# Patient Record
Sex: Female | Born: 1935 | Race: White | Hispanic: No | Smoking: Never smoker
Health system: Southern US, Community
[De-identification: ages and names within clinical notes are randomized; demographics above are authoritative.]

## PROBLEM LIST (undated history)

## (undated) DIAGNOSIS — I10 Essential (primary) hypertension: Secondary | ICD-10-CM

## (undated) DIAGNOSIS — G039 Meningitis, unspecified: Secondary | ICD-10-CM

## (undated) DIAGNOSIS — K219 Gastro-esophageal reflux disease without esophagitis: Secondary | ICD-10-CM

## (undated) DIAGNOSIS — H409 Unspecified glaucoma: Secondary | ICD-10-CM

## (undated) DIAGNOSIS — H3530 Unspecified macular degeneration: Secondary | ICD-10-CM

## (undated) DIAGNOSIS — R7309 Other abnormal glucose: Secondary | ICD-10-CM

## (undated) DIAGNOSIS — E785 Hyperlipidemia, unspecified: Secondary | ICD-10-CM

## (undated) DIAGNOSIS — C801 Malignant (primary) neoplasm, unspecified: Secondary | ICD-10-CM

## (undated) HISTORY — DX: Malignant (primary) neoplasm, unspecified: C80.1

## (undated) HISTORY — DX: Gastro-esophageal reflux disease without esophagitis: K21.9

## (undated) HISTORY — PX: MASTECTOMY: SHX3

## (undated) HISTORY — DX: Other abnormal glucose: R73.09

## (undated) HISTORY — PX: ABDOMINAL HYSTERECTOMY: SHX81

## (undated) HISTORY — DX: Hyperlipidemia, unspecified: E78.5

## (undated) HISTORY — PX: CORONARY STENT PLACEMENT: SHX1402

## (undated) HISTORY — PX: COLON RESECTION: SHX5231

## (undated) HISTORY — DX: Unspecified glaucoma: H40.9

## (undated) HISTORY — DX: Essential (primary) hypertension: I10

---

## 1971-11-28 HISTORY — PX: EYE SURGERY: SHX253

## 1977-11-27 HISTORY — PX: BREAST SURGERY: SHX581

## 1998-04-20 ENCOUNTER — Other Ambulatory Visit: Admission: RE | Admit: 1998-04-20 | Discharge: 1998-04-20 | Payer: Self-pay | Admitting: Internal Medicine

## 1998-09-24 ENCOUNTER — Encounter: Payer: Self-pay | Admitting: Emergency Medicine

## 1998-09-24 ENCOUNTER — Emergency Department (HOSPITAL_COMMUNITY): Admission: EM | Admit: 1998-09-24 | Discharge: 1998-09-24 | Payer: Self-pay | Admitting: Emergency Medicine

## 1998-10-22 ENCOUNTER — Encounter: Payer: Self-pay | Admitting: Internal Medicine

## 1998-10-22 ENCOUNTER — Ambulatory Visit (HOSPITAL_COMMUNITY): Admission: RE | Admit: 1998-10-22 | Discharge: 1998-10-22 | Payer: Self-pay | Admitting: Internal Medicine

## 1999-10-25 ENCOUNTER — Ambulatory Visit (HOSPITAL_COMMUNITY): Admission: RE | Admit: 1999-10-25 | Discharge: 1999-10-25 | Payer: Self-pay | Admitting: Internal Medicine

## 1999-10-25 ENCOUNTER — Encounter: Payer: Self-pay | Admitting: Internal Medicine

## 2000-04-19 ENCOUNTER — Ambulatory Visit (HOSPITAL_COMMUNITY): Admission: RE | Admit: 2000-04-19 | Discharge: 2000-04-19 | Payer: Self-pay | Admitting: Ophthalmology

## 2000-10-25 ENCOUNTER — Ambulatory Visit (HOSPITAL_COMMUNITY): Admission: RE | Admit: 2000-10-25 | Discharge: 2000-10-25 | Payer: Self-pay | Admitting: Internal Medicine

## 2000-10-25 ENCOUNTER — Encounter: Payer: Self-pay | Admitting: Internal Medicine

## 2000-11-07 ENCOUNTER — Encounter: Admission: RE | Admit: 2000-11-07 | Discharge: 2000-11-07 | Payer: Self-pay | Admitting: Internal Medicine

## 2000-11-07 ENCOUNTER — Encounter: Payer: Self-pay | Admitting: Internal Medicine

## 2001-10-29 ENCOUNTER — Encounter: Payer: Self-pay | Admitting: Internal Medicine

## 2001-10-29 ENCOUNTER — Ambulatory Visit (HOSPITAL_COMMUNITY): Admission: RE | Admit: 2001-10-29 | Discharge: 2001-10-29 | Payer: Self-pay | Admitting: Internal Medicine

## 2002-10-30 ENCOUNTER — Encounter: Payer: Self-pay | Admitting: Internal Medicine

## 2002-10-30 ENCOUNTER — Ambulatory Visit (HOSPITAL_COMMUNITY): Admission: RE | Admit: 2002-10-30 | Discharge: 2002-10-30 | Payer: Self-pay | Admitting: Internal Medicine

## 2004-04-22 ENCOUNTER — Ambulatory Visit (HOSPITAL_COMMUNITY): Admission: RE | Admit: 2004-04-22 | Discharge: 2004-04-22 | Payer: Self-pay | Admitting: Internal Medicine

## 2005-04-28 ENCOUNTER — Ambulatory Visit (HOSPITAL_COMMUNITY): Admission: RE | Admit: 2005-04-28 | Discharge: 2005-04-28 | Payer: Self-pay | Admitting: Internal Medicine

## 2006-04-30 ENCOUNTER — Ambulatory Visit (HOSPITAL_COMMUNITY): Admission: RE | Admit: 2006-04-30 | Discharge: 2006-04-30 | Payer: Self-pay | Admitting: Internal Medicine

## 2006-11-27 HISTORY — PX: SKIN CANCER EXCISION: SHX779

## 2007-06-13 ENCOUNTER — Ambulatory Visit (HOSPITAL_COMMUNITY): Admission: RE | Admit: 2007-06-13 | Discharge: 2007-06-13 | Payer: Self-pay | Admitting: Internal Medicine

## 2008-07-29 ENCOUNTER — Ambulatory Visit (HOSPITAL_COMMUNITY): Admission: RE | Admit: 2008-07-29 | Discharge: 2008-07-29 | Payer: Self-pay | Admitting: Internal Medicine

## 2008-09-04 ENCOUNTER — Ambulatory Visit: Payer: Self-pay | Admitting: Gastroenterology

## 2008-09-08 ENCOUNTER — Ambulatory Visit: Payer: Self-pay | Admitting: Gastroenterology

## 2008-09-09 ENCOUNTER — Ambulatory Visit (HOSPITAL_COMMUNITY): Admission: RE | Admit: 2008-09-09 | Discharge: 2008-09-09 | Payer: Self-pay | Admitting: Gastroenterology

## 2008-09-10 ENCOUNTER — Telehealth: Payer: Self-pay | Admitting: Gastroenterology

## 2008-09-28 ENCOUNTER — Ambulatory Visit: Payer: Self-pay | Admitting: Gastroenterology

## 2008-09-28 LAB — CONVERTED CEMR LAB
BUN: 17 mg/dL (ref 6–23)
Basophils Absolute: 0 10*3/uL (ref 0.0–0.1)
Basophils Relative: 0.4 % (ref 0.0–3.0)
CO2: 30 meq/L (ref 19–32)
Chloride: 106 meq/L (ref 96–112)
GFR calc Af Amer: 103 mL/min
Glucose, Bld: 107 mg/dL — ABNORMAL HIGH (ref 70–99)
Hemoglobin: 13 g/dL (ref 12.0–15.0)
MCHC: 33.8 g/dL (ref 30.0–36.0)
Monocytes Relative: 8.9 % (ref 3.0–12.0)
Neutrophils Relative %: 72.2 % (ref 43.0–77.0)
Platelets: 251 10*3/uL (ref 150–400)
Potassium: 3.9 meq/L (ref 3.5–5.1)
WBC: 8 10*3/uL (ref 4.5–10.5)

## 2008-09-29 LAB — CONVERTED CEMR LAB: CEA: 1 ng/mL (ref 0.0–5.0)

## 2008-09-30 ENCOUNTER — Ambulatory Visit: Payer: Self-pay | Admitting: Cardiology

## 2008-10-12 ENCOUNTER — Encounter: Payer: Self-pay | Admitting: Gastroenterology

## 2008-10-14 ENCOUNTER — Ambulatory Visit: Payer: Self-pay | Admitting: Cardiology

## 2008-11-02 ENCOUNTER — Encounter: Payer: Self-pay | Admitting: Cardiology

## 2008-11-02 ENCOUNTER — Ambulatory Visit: Payer: Self-pay

## 2008-12-17 ENCOUNTER — Encounter: Payer: Self-pay | Admitting: Gastroenterology

## 2008-12-18 ENCOUNTER — Encounter: Admission: RE | Admit: 2008-12-18 | Discharge: 2008-12-18 | Payer: Self-pay | Admitting: Surgery

## 2009-01-08 ENCOUNTER — Encounter: Admission: RE | Admit: 2009-01-08 | Discharge: 2009-01-08 | Payer: Self-pay | Admitting: Surgery

## 2009-01-25 ENCOUNTER — Inpatient Hospital Stay (HOSPITAL_COMMUNITY): Admission: RE | Admit: 2009-01-25 | Discharge: 2009-01-31 | Payer: Self-pay | Admitting: Surgery

## 2009-01-25 ENCOUNTER — Ambulatory Visit: Payer: Self-pay | Admitting: Cardiology

## 2009-02-19 ENCOUNTER — Encounter: Payer: Self-pay | Admitting: Gastroenterology

## 2009-09-03 ENCOUNTER — Ambulatory Visit (HOSPITAL_COMMUNITY): Admission: RE | Admit: 2009-09-03 | Discharge: 2009-09-03 | Payer: Self-pay | Admitting: Internal Medicine

## 2010-08-05 ENCOUNTER — Observation Stay (HOSPITAL_COMMUNITY): Admission: EM | Admit: 2010-08-05 | Discharge: 2010-08-07 | Payer: Self-pay | Admitting: Emergency Medicine

## 2010-09-05 ENCOUNTER — Ambulatory Visit (HOSPITAL_COMMUNITY): Admission: RE | Admit: 2010-09-05 | Discharge: 2010-09-05 | Payer: Self-pay | Admitting: Internal Medicine

## 2010-12-18 ENCOUNTER — Encounter: Payer: Self-pay | Admitting: Internal Medicine

## 2011-01-25 ENCOUNTER — Other Ambulatory Visit: Payer: Self-pay | Admitting: Internal Medicine

## 2011-01-25 DIAGNOSIS — R140 Abdominal distension (gaseous): Secondary | ICD-10-CM

## 2011-01-25 DIAGNOSIS — R197 Diarrhea, unspecified: Secondary | ICD-10-CM

## 2011-01-30 ENCOUNTER — Ambulatory Visit
Admission: RE | Admit: 2011-01-30 | Discharge: 2011-01-30 | Disposition: A | Discharge status: Home / Self Care | Payer: Medicare Other | Source: Ambulatory Visit | Attending: Internal Medicine | Admitting: Internal Medicine

## 2011-01-30 ENCOUNTER — Encounter: Payer: Self-pay | Admitting: Gastroenterology

## 2011-01-30 DIAGNOSIS — R197 Diarrhea, unspecified: Secondary | ICD-10-CM

## 2011-01-30 DIAGNOSIS — R140 Abdominal distension (gaseous): Secondary | ICD-10-CM

## 2011-01-30 MED ORDER — IOHEXOL 300 MG/ML  SOLN
100.0000 mL | Freq: Once | INTRAMUSCULAR | Status: AC | PRN
  Administered 2011-01-30: 100 mL via INTRAVENOUS

## 2011-02-09 LAB — POCT I-STAT, CHEM 8
BUN: 20 mg/dL (ref 6–23)
Calcium, Ion: 1.12 mmol/L (ref 1.12–1.32)
Chloride: 107 meq/L (ref 96–112)
Creatinine, Ser: 0.6 mg/dL (ref 0.4–1.2)
Glucose, Bld: 139 mg/dL — ABNORMAL HIGH (ref 70–99)
HCT: 44 % (ref 36.0–46.0)
Hemoglobin: 15 g/dL (ref 12.0–15.0)
Potassium: 3.9 mEq/L (ref 3.5–5.1)
Sodium: 142 meq/L (ref 135–145)
TCO2: 26 mmol/L (ref 0–100)

## 2011-02-09 LAB — CBC
MCHC: 33.8 g/dL (ref 30.0–36.0)
Platelets: 166 10*3/uL (ref 150–400)
RDW: 13.9 % (ref 11.5–15.5)
WBC: 4.9 10*3/uL (ref 4.0–10.5)

## 2011-02-09 LAB — PHOSPHORUS: Phosphorus: 4.4 mg/dL (ref 2.3–4.6)

## 2011-02-09 LAB — URINALYSIS, ROUTINE W REFLEX MICROSCOPIC
Glucose, UA: NEGATIVE mg/dL
Nitrite: NEGATIVE
pH: 7 (ref 5.0–8.0)

## 2011-02-09 LAB — POCT CARDIAC MARKERS
CKMB, poc: <1 ng/mL — ABNORMAL LOW (ref 1.0–8.0)
Myoglobin, poc: 54.1 ng/mL (ref 12–200)
Troponin i, poc: <0.05 ng/mL (ref 0.00–0.09)

## 2011-02-09 LAB — HEMOGLOBIN A1C
Hgb A1c MFr Bld: 6.4 % — ABNORMAL HIGH (ref <5.7)
Mean Plasma Glucose: 137 mg/dL — ABNORMAL HIGH (ref <117)

## 2011-02-09 LAB — COMPREHENSIVE METABOLIC PANEL
AST: 21 U/L (ref 0–37)
Albumin: 3 g/dL — ABNORMAL LOW (ref 3.5–5.2)
BUN: 16 mg/dL (ref 6–23)
Calcium: 8.7 mg/dL (ref 8.4–10.5)
Chloride: 110 mEq/L (ref 96–112)
Creatinine, Ser: 0.68 mg/dL (ref 0.4–1.2)
GFR calc Af Amer: >60 mL/min (ref >60)
Total Bilirubin: 0.6 mg/dL (ref 0.3–1.2)

## 2011-02-09 LAB — DIFFERENTIAL
Basophils Absolute: 0 10*3/uL (ref 0.0–0.1)
Eosinophils Absolute: 0.2 10*3/uL (ref 0.0–0.7)
Lymphocytes Relative: 19 % (ref 12–46)
Monocytes Relative: 7 % (ref 3–12)

## 2011-02-09 LAB — VITAMIN B12: Vitamin B-12: 488 pg/mL (ref 211–911)

## 2011-03-09 LAB — BASIC METABOLIC PANEL
CO2: 27 mEq/L (ref 19–32)
CO2: 27 mEq/L (ref 19–32)
Calcium: 7.7 mg/dL — ABNORMAL LOW (ref 8.4–10.5)
Calcium: 7.9 mg/dL — ABNORMAL LOW (ref 8.4–10.5)
Chloride: 105 mEq/L (ref 96–112)
Chloride: 106 mEq/L (ref 96–112)
Chloride: 110 mEq/L (ref 96–112)
Creatinine, Ser: 0.64 mg/dL (ref 0.4–1.2)
GFR calc Af Amer: >60 mL/min (ref >60)
GFR calc Af Amer: >60 mL/min (ref >60)
GFR calc Af Amer: >60 mL/min (ref >60)
Glucose, Bld: 146 mg/dL — ABNORMAL HIGH (ref 70–99)
Potassium: 3.8 mEq/L (ref 3.5–5.1)
Sodium: 135 mEq/L (ref 135–145)
Sodium: 137 mEq/L (ref 135–145)
Sodium: 139 mEq/L (ref 135–145)

## 2011-03-09 LAB — CBC
HCT: 31.6 % — ABNORMAL LOW (ref 36.0–46.0)
HCT: 32.8 % — ABNORMAL LOW (ref 36.0–46.0)
Hemoglobin: 10.8 g/dL — ABNORMAL LOW (ref 12.0–15.0)
Hemoglobin: 11.3 g/dL — ABNORMAL LOW (ref 12.0–15.0)
Hemoglobin: 12.2 g/dL (ref 12.0–15.0)
MCHC: 34.2 g/dL (ref 30.0–36.0)
MCHC: 34.4 g/dL (ref 30.0–36.0)
MCV: 90.3 fL (ref 78.0–100.0)
MCV: 90.5 fL (ref 78.0–100.0)
RBC: 3.5 MIL/uL — ABNORMAL LOW (ref 3.87–5.11)
RBC: 3.62 MIL/uL — ABNORMAL LOW (ref 3.87–5.11)
RBC: 3.99 MIL/uL (ref 3.87–5.11)
WBC: 12.7 10*3/uL — ABNORMAL HIGH (ref 4.0–10.5)

## 2011-03-09 LAB — DIFFERENTIAL
Basophils Relative: 0 % (ref 0–1)
Monocytes Absolute: 0.6 10*3/uL (ref 0.1–1.0)
Monocytes Relative: 6 % (ref 3–12)
Neutro Abs: 9.8 10*3/uL — ABNORMAL HIGH (ref 1.7–7.7)

## 2011-03-09 LAB — CARDIAC PANEL(CRET KIN+CKTOT+MB+TROPI): Total CK: 146 U/L (ref 7–177)

## 2011-03-14 LAB — DIFFERENTIAL
Basophils Absolute: 0 10*3/uL (ref 0.0–0.1)
Basophils Relative: 0 % (ref 0–1)
Eosinophils Relative: 2 % (ref 0–5)
Monocytes Absolute: 0.7 10*3/uL (ref 0.1–1.0)

## 2011-03-14 LAB — COMPREHENSIVE METABOLIC PANEL
ALT: 23 U/L (ref 0–35)
AST: 26 U/L (ref 0–37)
Albumin: 3.1 g/dL — ABNORMAL LOW (ref 3.5–5.2)
Alkaline Phosphatase: 77 U/L (ref 39–117)
BUN: 17 mg/dL (ref 6–23)
Chloride: 106 mEq/L (ref 96–112)
GFR calc Af Amer: >60 mL/min (ref >60)
Potassium: 3.5 mEq/L (ref 3.5–5.1)
Total Bilirubin: 0.7 mg/dL (ref 0.3–1.2)

## 2011-03-14 LAB — CEA: CEA: 0.8 ng/mL (ref 0.0–5.0)

## 2011-03-14 LAB — CBC
HCT: 39.8 % (ref 36.0–46.0)
Platelets: 218 10*3/uL (ref 150–400)
WBC: 8.6 10*3/uL (ref 4.0–10.5)

## 2011-03-21 ENCOUNTER — Other Ambulatory Visit (HOSPITAL_COMMUNITY): Payer: Self-pay | Admitting: Internal Medicine

## 2011-03-21 DIAGNOSIS — Z1231 Encounter for screening mammogram for malignant neoplasm of breast: Secondary | ICD-10-CM

## 2011-04-11 NOTE — Consult Note (Signed)
NAME:  [name], [name]            ACCOUNT NO.:  [id number]   MEDICAL RECORD NO.:  [id number]          PATIENT TYPE:  INP   LOCATION:  1239                         FACILITY:  [facility]   PHYSICIAN:  Marca Ancona, MD      DATE OF BIRTH:  1922-07-25   DATE OF CONSULTATION:  01/26/2009  DATE OF DISCHARGE:                                 CONSULTATION   REFERRING PHYSICIAN:  Dr. Sandria Bales. Newman.   PRIMARY CARDIOLOGIST:  Marca Ancona, MD   HISTORY OF PRESENT ILLNESS:  This is an 77 year old with a history of  CAD status post percutaneous coronary intervention to the LAD in 1995,  breast cancer status post mastectomy, and small bowel carcinoid who had  surgery yesterday for resection of small bowel carcinoid and low  anterior rectosigmoid resection for sigmoid colonic stricture.  The  patient was doing well postoperatively until 4:00 p.m. today when she  went into atrial fibrillation with rapid ventricular response in the  150s.  The patient denies any chest pain or shortness of breath.  Her  only complaint actually is pain in her surgical site in her abdomen.  She is tolerating the rapid rate well with blood pressure of 136/57.  She is on a Diltiazem drip at 20 mg an hour.  She has been getting 2.5  mg IV Lopressor boluses.  Current heart rate is in the 110s to the 120s.  The patient was up to the 150s before the Diltiazem drip was started.   PAST MEDICAL HISTORY:  1. Coronary artery disease.  The patient had the onset of angina with      a positive stress test in 1995.  She did have a left heart      catheterization at that time.  There was a 95% proximal LAD      stenosis to the circumflex and RCA had no significant disease.  EF      was 68%.  The patient did have a bare metal stent placed in the      proximal LAD.  The patient's most recent stress test was an      exercise Cardiolite done in July of 1998 where she exercised for 10      minutes 15 seconds and had an EF of 79% with no  evidence for      ischemia or infarct.  2. Small bowel carcinoid tumor.  The patient is status post resection      of this tumor on March 1.  3. Rectosigmoid colonic stricture and the patient did also have a low      anterior resection to remove this stricture.  This was also done on      March 1.  4. Diverticulosis.  5. Hyperlipidemia.  6. History of breast cancer status post left mastectomy.  7. Atrial fibrillation with rapid ventricular response.  This is new      onset today.   CURRENT MEDICATIONS:  1. Almivopan 12 mg b.i.d.  2. Heparin 5000 units subcu t.i.d.  3. metoprolol 2.5 mg IV every 6 hours.  4. Diltiazem drip at 20 mg per  hour.   SOCIAL HISTORY:  The patient is married.  Her husband is in a nursing  home now because of dementia.  She lives alone.  Her daughter lives near  her.  She is a retired Airline pilot.  She has never smoked.  She drinks  alcohol rarely.   FAMILY HISTORY:  Noncontributory.   LABORATORIES:  Today cardiac enzymes negative x1.  Sodium 137, potassium  3.7, chloride 106, bicarb 27, BUN 7, creatinine 0.6, hematocrit is 32.8.   EKG was reviewed.  It shows atrial fibrillation with a rapid ventricular  response at 133.  There are nonspecific inferior ST-T wave changes.   REVIEW OF SYSTEMS:  Is negative except as noted in the history of  present illness.   PHYSICAL EXAM:  Blood pressure is 136/57, heart rate is irregular in  atrial fibrillation with a rate ranging from the 110s to the 120s.  Oxygen saturations 96% on 2 liters by nasal cannula.  The patient is  afebrile.  GENERAL:  This is an elderly female with mild distress due to abdominal  pain.  NEUROLOGICALLY:  She is alert and oriented x3.  She has normal affect.  NECK:  JVP is 7 cm.  There is no thyromegaly or thyroid nodule.  There  is no carotid bruit.  CARDIOVASCULAR:  Heart irregular.  S1, S2, tachycardiac.  No S3, no S4,  no murmur.  ABDOMEN:  There is a surgical incision.  There is  mild diffuse  tenderness around the incision.  EXTREMITIES:  No peripheral edema.  There are 1+ posterior tibial pulses  bilaterally.  No clubbing or cyanosis.  MUSCULOSKELETAL:  Normal exam.  HEENT:  Normal exam.  SKIN:  Normal exam.   ASSESSMENT/PLAN:  This is an 77 year old with a history of coronary  artery disease status post percutaneous coronary intervention in 1995  who yesterday had resection of a small bowel carcinoid tumor as well as  resection of a sigmoid stricture.  Today she has developed postoperative  atrial fibrillation with rapid ventricular response.  1. Atrial fibrillation.  The patient's heart rate continues to be high      in the 110s to 120s even on high dose of Diltiazem drip.  I will      have them go ahead and give her a bolus of 5 mg intravenous      Lopressor now.  We will increase her Lopressor to 5 mg intravenous      every 4 hours.  If this does not successfully slow down her heart      rate to less than 100, I will go ahead and have her loaded with      digoxin 500 mg intravenous x1 followed by 0.125 mg intravenous in 6      hours and then 0.125 mg daily.  When okay from a surgical      perspective, if she remains in atrial fibrillation I would go ahead      and begin her on a heparin drip.  2. Coronary artery disease.  The patient does have history of coronary      disease.  She has had no chest pain with atrial fibrillation with a      rapid response.  She has actually had no symptoms since back in      1995 when she had her percutaneous coronary intervention.  We will      cycle her cardiac enzymes.  When able to take oral medications she  should go back on her aspirin and statin.  3. We will follow this patient with you.      Marca Ancona, MD  Electronically Signed     DM/MEDQ  D:  01/26/2009  T:  01/26/2009  Job:  [id number]

## 2011-04-11 NOTE — Op Note (Signed)
NAME:  [name], [name]            ACCOUNT NO.:  [id number]   MEDICAL RECORD NO.:  [id number]          PATIENT TYPE:  INP   LOCATION:  1239                         FACILITY:  [facility]   PHYSICIAN:  Sandria Bales. Ezzard Standing, M.D.  DATE OF BIRTH:  06-27-1922   DATE OF PROCEDURE:  01/25/2009  DATE OF DISCHARGE:                               OPERATIVE REPORT   Date of Surgery - 25 January 2009   PREOPERATIVE DIAGNOSES:  1. 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  2. Sigmoid colon stricture.   POSTOPERATIVE DIAGNOSES:  1. Probable carcinoma of the small bowel.  2. Sigmoid colon stricture, probably secondary to diverticular      disease.  3. Small bowel stuck on sigmoid colon stricture.   PROCEDURE:  1. Resection of mid small bowel and mesenteric mass.  2. Resection of distal small bowel that was stuck to sigmoid colon.  3. Low anterior resection of rectosigmoid with #33 EEA anastomosis.   SURGEON:  Sandria Bales. Ezzard Standing, M.D.   FIRST ASSISTANT:  Dr. Cicero Duck.   ANESTHESIA:  General endotracheal.   ESTIMATED BLOOD LOSS:  700 mL.   DRAINS:  None.   INDICATIONS FOR PROCEDURE:  Ms. Friedly is an 77 year old white  female, a patient of Dr. Marlowe Shores, who was found to have a 6.7 cm  calcified mass in her mid abdomen consistent with probable carcinoid of  the small bowel.  Dr. Christella Hartigan is her gastroenterologist and  she also has  a stricture of her distal colon seen at colonoscopy and barium enema.   Discussion carried out with the patient about the options for treatment  and it is felt even though this tumor was probably minimally  symptomatic, she is best served by undergoing a resection of tumor and  small bowel at the same during resection of her sigmoid colon where it  is strictured.   In addition she was noted to have a left flank hernia which is  asymptomatic and we will leave alone and she has gallstones which are  asymptomatic and we will leave alone.   The indications and  potential complications of procedure were explained  to the patient.  Potential complications include, but are not limited  to, bleeding, infection, leak from any of the anastomosis, the chance  that cancer or tumor recur if I can not resect it all and she has other  comorbidity problems which increase the risk for surgery.   OPERATIVE NOTE:  The patient placed in supine position.  She completed a  mechanical bowel prep at home.  She had an NG tube and Foley catheter in  place, was given cefoxitin at initiation of the procedure.  A time-out  was held before initiation of the procedure.  She was put in lithotomy.  Her abdomen was prepped with Techni-Care look alike solution.  I prepped  out her abdomen and perineum.  A time out was held identifying the  patient and the procedure.   I went through a midline incision.  I did encounter old wire sutures  from her prior hysterectomy.  I got to the abdominal cavity and  carried  out abdominal exploration.  Her left lobe she had about a 2 cm  hemangioma.  The lateral aspect of the left lobe was unremarkable.  Right lobe liver was unremarkable, NG tube was palpated in the right  position.  The gallbladder was soft.  I am not sure I could feel the  gall stones.  She had adhesions both along the right colonic gutter and  left colonic gutter which prohibited some palpation of the upper  abdomen.   I ran the small bowel.  In the mid small bowel, she had an approximate 7  cm tumor/mass tethering the small bowel and going down to close to the  base of the mesentery of small bowel.  The distal small bowel was stuck  down around the sigmoid colon which was densely adhered to the left  pelvic sidewall.   First I turned my attention to the small bowel resection.  I cut down to  this tumor again which was 6 to 7 cm in diameter.  I resected the small  bowel which was balled up over this tumor.  I resected all gross tumor,  leaving no nodules left behind  that I could see but I did leave some of  the small bowel ischemic.  After resection I marked the proximal end  with a suture and what I did was go down and do the sigmoid resection  was gave the small bowel to demarcate before doing the anastomosis.   So after the tumor resection and this was sent off, I then started the  sigmoid colon resection and her distal ileum was tethered over this  inflammatory mass along the left colon.  I did not see any obvious or  gross tumor.  I think this was probable all diverticular disease.  In  getting the small bowel, I denuded up I felt uncomfortable leaving  behind so I did resect about a 5 cm segment of small bowel.  I did a  hand-sewn end-to-end anastomosis with 3-0 silk sutures left about a 1 cm  anastomosis.   I identified the left ureter which was lateral to this inflammatory  mass.  I did not really get on the right side of the pelvis, so I did  not identify the right ureter.  I went down the mesentery of the colon  and divided this very hard mass which looked inflammatory, which  was  probably about 3 or 4 cm in diameter and about 5 or 6 cm above her  peritoneal reflection.   We mobilized the colon down to the peritoneal reflection, so did a low  anterior resection.  I divided the proximal colon with a 75 Ethicon GIA  stapler.  I divided the distal colon with a blue load of a Contour  stapler. I put a suture on the proximal end of the colon and sent this  off as specimen.   I then carried out an end to end anastomosis using the EEA Ethicon  stapler.  The proximal bowel easily admitted a 33-mm sizing bulb, so I  used a 33 Ethicon EEA stapler.  I whipstitched the proximal loop with a  2-0 Prolene suture and tied this to the anvil of the EEA.   Dr. Jamey Ripa then broke scrub and went below to pass the EEA up the rectum  and he fired this through the rectal stump.  I got a good proximal and  distal rings with EEA.  He did sigmoidoscoped the  patient  with me  clamping off the bowel.  There was no evidence of air leak.  The  anastomosis at about 15 cm from the anal verge and widely patent and no  evidence of air leak.   I then turned attention back to the small bowel.  I resected the small  bowel back about 4 or 5 cm towards the ileum but probably some 30 to  40  cm more distal small bowel to what looked like very healthy viable small  bowel.  I used first a 75-mm Ethicon GIA stapler for a side-to-side  anastomosis.  Then I crossed this with #60 mm Ethicon TA stapler.  I  used primarily the LigaSure for the mesentery with of both small bowel  and the colon.  I needed to also use a 2-0 silk sutures to ligate the  vessels.   I closed the mesentery of the small bowel using interrupted 2-0 and 3-0  silk sutures.  I did the crotch stitch.  Again, the bowel looked viable.  I then ran the entire length of the small bowel.  It looked like I had  about 130 cm proximal to the anastomosis and had about 70 cm small bowel  distal to the anastomosis.     I then irrigated the abdomen out with 4 liters of normal saline.  The  abdomen was then closed with two running #1 PDS sutures.  The skin  closed with a staple gun and a Telfa wick was placed in the wound.  The  patient tolerated the procedure well.  Sponge and needle count were  correct at the end of the case.  The estimated blood loss was about 700  mL and she is transferred to recovery room in good condition.      Sandria Bales. Ezzard Standing, M.D.  Electronically Signed     DHN/MEDQ  D:  01/25/2009  T:  01/25/2009  Job:  [id number]   cc:   Lucky Cowboy, M.D.  Fax: [phone]   Rachael Fee, MD  [address]  [place], [place] [address]   Marca Ancona, MD  [address]  [place] [address]

## 2011-04-11 NOTE — Discharge Summary (Signed)
NAME:  [name], [name]            ACCOUNT NO.:  [id number]   MEDICAL RECORD NO.:  [id number]          PATIENT TYPE:  INP   LOCATION:  1431                         FACILITY:  [facility]   PHYSICIAN:  Sandria Bales. Ezzard Standing, M.D.  DATE OF BIRTH:  30-Apr-1922   DATE OF ADMISSION:  01/25/2009  DATE OF DISCHARGE:  01/31/2009                               DISCHARGE SUMMARY   Date of Admission - 25 January 2009  Date of Discharge - 31 January 2009   DISCHARGE DIAGNOSES:  1. Extensive mesenteric fibro-inflammatory infiltrate of small bowel      mesentery.  2. Extensive mesenteric fibro-inflammatory infiltrate of colonic      mesentery; no evidence of malignancy with either lesion.  3. Atrial fibrillation with rapid ventricular response.  4. Asymptomatic gallstones.  5. Cystic lesion, left kidney.  6. Left flank hernia.  7. Diverticulosis.  8. Coronary artery disease.  9. Hypercholesterolemia.  10.History of breast cancer.  11.Macular degeneration with chronically dilated right pupil.   OPERATIONS PERFORMED:  Small bowel resection x2 and a low anterior  colonic resection on January 25, 2009 by Dr. Algis Downs. Newman.   HISTORY OF ILLNESS:  [name] is a talkative 77 year old white  female who is a patient of Dr. Marlowe Shores.  She had undergone an  evaluation Dr. Christella Hartigan who found a stricture at her sigmoid colon.  A CT  scan obtained revealed a calcific mesenteric mass of her mid small bowel  consistent with a possible carcinoid tumor.   A discussion was carried out with the patient about resecting this small  bowel tumor and at the same time resecting her sigmoid colon for the  strictured area.   PAST MEDICAL HISTORY:  1. She has had coronary artery disease followed by Dr. Marca Ancona,      but had a stable echocardiogram.  2. History of breast cancer.  3. She had been found to have asymptomatic gallstones.  4. Left flank hernia.   HOSPITAL COURSE:  She completed a mechanical and antibiotic bowel  prep  prior to coming to the hospital.  Admitted on January 25, 2009.  She was  taken to the operating room where she underwent two small bowel  resections: a major a small bowel resection involving a mass of her  mesentery, a smaller distal small bowel resection because of small bowel  stuck to her sigmoid colon, and then she underwent a low anterior  rectosigmoid colectomy with a 33 EEA anastomosis.   Postoperatively, she did well.  On the first postoperative day, her  sodium was 137, potassium 3.7, chloride of 106, CO2 of 27.  Her white  blood count was 11,000.   She was placed in the ICU for observation.  However, she developed after  surgery an atrial fibrillation with a rapid ventricular rate, and I  asked Dr. Shirlee Latch and [facility] to see her.   She was started on Cardizem, then loaded with digoxin and switched over  to Lopressor, and within about 24 hours, her heart rythm returned to a  sinus rhythm.  We did obtain cardiac enzymes.  Troponin and CK-MB  were  normal.  On the second postoperative day, her potassium was 3.8, her  hemoglobin 10, her white blood count 1105.   She continued to steadily get better.  She was on Entereg as a  postoperative ileus drug.  By the fourth postoperative day, she had a  couple small bowel movements.  She was start on clear liquids.  She was  advanced over the next couple of days to a regular diet.  She is now 6  days postoperative, she is afebrile, her abdomen looks good, she is in a  sinus rhythm and ready for discharge.   Her final pathology showed small bowel with extensive mesenteric fiber  inflammatory infiltrate, and her colon showed extensive mesenteric fibro-  inflammatory infiltrate.  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   She wanted a four post walker to go home with.   DISCHARGE MEDICATIONS:  1.  She has been given Vicodin for pain.  She is to resume her home medications which included:  1. Lipitor 20 mg every of the day.  2. Metoprolol 100 mg daily.  3. Bumetanide 1 mg daily.  4. Niacin  5. She does take multiple vitamins.  6. Xalatan eye drops daily.   DISCHARGE CONDITION:  Good.  She will return to see me in 2 to 3 weeks in the office.      Sandria Bales. Ezzard Standing, M.D.  Electronically Signed     DHN/MEDQ  D:  01/31/2009  T:  01/31/2009  Job:  [id number]   cc:   Lucky Cowboy, M.D.  Fax: [phone]   Rachael Fee, MD  [address]  [place], [place] [address]   Marca Ancona, MD  [address]  [place] [address]

## 2011-04-11 NOTE — Assessment & Plan Note (Signed)
[facility]                            CARDIOLOGY OFFICE NOTE   NAME:SCHOOLFIELDArlisha, [name]                     MRN:          [id number]  DATE:10/14/2008                            DOB:          1921-12-26    PRIMARY CARE PHYSICIAN:  Julie Cowboy, MD   GASTROENTEROLOGIST:  Julie Fee, MD   SURGEON:  Julie Muckle, MD   HISTORY OF PRESENT ILLNESS:  This is an 77 year old with a history of  coronary artery disease status post percutaneous coronary artery  intervention in 1995 to the LAD, who presents for a preoperative  evaluation prior to a possible surgery to remove a pelvic tumor.  The  patient actually has been in quite good health.  She does state that  prior to her percutaneous coronary artery intervention in 1995 that she  had left shoulder pain with exertion and then had a positive stress  test.  This was followed by PCI to the LAD.  Since that time, she has  had no further chest pain or arm pain with exertion.  She really has no  exercise limitations.  She does her housework.  She does some yard work.  She is able to climb a flight of steps with no trouble.  She does not  become short of breath with her everyday activities.  She has been under  a little stress lately as she has recently had to put her husband in a  nursing home because of dementia.  She was sent to GI for a screening  colonoscopy and a rectosigmoid colonic stricture was found.  As part of  the workup for the stricture, she did have the CT of her abdomen and  pelvis and on the CT, a left pelvic mass was noted that was thought to  be consistent with a carcinoid tumor.  The patient has been evaluated by  Surgery for this and surgery to remove the tumor is being considered.  The patient does not have significant history of diarrhea, flushing, or  other manifestations of the carcinoid syndrome.  She does state that for  a long time in the past when she has become nervous or  under emotional  stress, she develops diarrhea in the setting of that, but this has not  happened recently.   PAST MEDICAL HISTORY:  1. Coronary artery disease.  The patient had onset of angina with a      positive stress test in 1995.  She did have a left heart      catheterization.  At that time, there was a 95% proximal LAD      stenosis.  The circumflex and the RCA had no significant disease.      EF was 68%.  The patient did have a bare-metal stent placed in the      proximal LAD.  The patient's most recent stress test was an      exercise Cardiolite done in June 14, 1997, where she exercised for      10 minutes and 15 seconds and had an EF of 79% with no evidence for  ischemia or infarct.  2. Left pelvic mesenteric mass consistent with carcinoid tumor.  This      was found on recent CT scan.  3. Rectosigmoid colonic stricture.  4. Diverticulosis.  5. Hypercholesterolemia.  6. History of breast cancer status post left mastectomy.   MEDICATIONS:  1. Toprol-XL 100 mg daily.  2. Bumex 1 mg daily.  3. Lipitor 20 mg daily.  4. Aspirin 81 mg daily.  5. Niacin 1000 mg daily.  6. Potassium chloride.  7. Multiple vitamins.   LABORATORY DATA:  Most recent labs in November 2009; potassium 3.9,  creatinine 0.7, hematocrit 38.5, and platelets 251.  EKG today shows  normal sinus rhythm with occasional PVCs.   SOCIAL HISTORY:  The patient is married.  Her husband is into a nursing  home now because of dementia.  She lives alone.  Her daughter lives near  her.  She is a retired Airline pilot.  She has never smoked.  She drinks  alcohol rarely.   FAMILY HISTORY:  Noncontributory.   REVIEW OF SYSTEMS:  Negative, except as noted in the history of present  illness.   PHYSICAL EXAMINATION:  VITAL SIGNS:  Blood pressure is 111/73, heart  rate is 66 and regular, and weight is 150 pounds.  GENERAL:  This is an elderly female in no apparent distress.  NEUROLOGIC:  The patient is quite  alert.  She is normally oriented.  She  has a normal affect.  NECK:  There is no JVD, no thyromegaly, or thyroid nodule.  There is no  carotid bruit.  CARDIOVASCULAR:  Heart, regular S1 and S2.  No S3.  No S4.  There is no  murmur.  ABDOMEN:  Soft and nontender.  No hepatosplenomegaly.  Normal bowel  sounds.  EXTREMITIES:  There is no peripheral edema.  There are 1+ posterior  tibial pulses bilaterally.  No clubbing or cyanosis.  MUSCULOSKELETAL:  Normal exam.  HEENT:  Normal exam.  SKIN:  Normal exam.   ASSESSMENT AND PLAN:  This is an 77 year old with a history of coronary  artery disease status post percutaneous coronary artery intervention in  1995, who now has a left pelvic mass that may be consistent with a  carcinoid tumor.  1. Coronary artery disease.  The patient appears quite stable from a      coronary artery disease standpoint.  She does deny any recurrent      chest or arm pain and has excellent exercise tolerance with no      dyspnea on exertion.  I do not think that a stress test would be      warranted prior to surgery.  She should be able to go forward with      the surgery without any stress testing given her lack of symptoms.      She should continue on her Toprol-XL 100 mg daily perioperatively.      She also should be started back on her aspirin as soon as possible      postoperatively  2. Hypercholesterolemia.  The patient is on Lipitor and niacin.  We      will call Dr. Kathryne Wang office and obtain records of her most      recent lipid check.  My goal LDL for her would be less than 70 and      if needed, we would increase the Lipitor.  3. Pelvic tumor.  This maybe a carcinoid tumor.  She does not have any  symptoms of the carcinoid syndrome that I can elicit; however as      carcinoid syndrome can cause a right-sided valvular problems in the      heart with metastatic carcinoid to the liver, we will obtain an      echocardiogram to assess for any valvular  heart disease.  4. Volume status.  The patient is on Bumex currently.  She does appear      euvolemic on my exam.     Julie Ancona, MD  Electronically Signed    DM/MedQ  DD: 10/14/2008  DT: 10/15/2008  Job #: [id number]   cc:   [name], M.D.  Julie Muckle, MD  Julie Fee, MD

## 2011-09-13 ENCOUNTER — Ambulatory Visit (HOSPITAL_COMMUNITY)
Admission: RE | Admit: 2011-09-13 | Discharge: 2011-09-13 | Disposition: A | Discharge status: Home / Self Care | Payer: Medicare Other | Source: Ambulatory Visit | Attending: Internal Medicine | Admitting: Internal Medicine

## 2011-09-13 ENCOUNTER — Other Ambulatory Visit (HOSPITAL_COMMUNITY): Payer: Self-pay | Admitting: Internal Medicine

## 2011-09-13 DIAGNOSIS — Z1231 Encounter for screening mammogram for malignant neoplasm of breast: Secondary | ICD-10-CM

## 2011-11-18 ENCOUNTER — Encounter: Payer: Self-pay | Admitting: *Deleted

## 2011-11-18 ENCOUNTER — Emergency Department (HOSPITAL_COMMUNITY): Payer: Medicare Other

## 2011-11-18 ENCOUNTER — Emergency Department (HOSPITAL_COMMUNITY)
Admission: EM | Admit: 2011-11-18 | Discharge: 2011-11-18 | Disposition: A | Discharge status: Home / Self Care | Payer: Medicare Other | Attending: Emergency Medicine | Admitting: Emergency Medicine

## 2011-11-18 ENCOUNTER — Other Ambulatory Visit: Payer: Self-pay

## 2011-11-18 DIAGNOSIS — Z79899 Other long term (current) drug therapy: Secondary | ICD-10-CM | POA: Insufficient documentation

## 2011-11-18 DIAGNOSIS — R51 Headache: Secondary | ICD-10-CM | POA: Insufficient documentation

## 2011-11-18 DIAGNOSIS — R011 Cardiac murmur, unspecified: Secondary | ICD-10-CM | POA: Insufficient documentation

## 2011-11-18 DIAGNOSIS — Z7982 Long term (current) use of aspirin: Secondary | ICD-10-CM | POA: Insufficient documentation

## 2011-11-18 DIAGNOSIS — I10 Essential (primary) hypertension: Secondary | ICD-10-CM | POA: Insufficient documentation

## 2011-11-18 HISTORY — DX: Meningitis, unspecified: G03.9

## 2011-11-18 HISTORY — DX: Unspecified macular degeneration: H35.30

## 2011-11-18 LAB — COMPREHENSIVE METABOLIC PANEL
ALT: 18 U/L (ref 0–35)
AST: 20 U/L (ref 0–37)
Alkaline Phosphatase: 78 U/L (ref 39–117)
CO2: 28 mEq/L (ref 19–32)
Calcium: 9.3 mg/dL (ref 8.4–10.5)
Chloride: 108 mEq/L (ref 96–112)
GFR calc Af Amer: 88 mL/min — ABNORMAL LOW (ref >90)
GFR calc non Af Amer: 76 mL/min — ABNORMAL LOW (ref >90)
Glucose, Bld: 119 mg/dL — ABNORMAL HIGH (ref 70–99)
Potassium: 3.7 mEq/L (ref 3.5–5.1)
Sodium: 143 mEq/L (ref 135–145)
Total Bilirubin: 0.6 mg/dL (ref 0.3–1.2)

## 2011-11-18 LAB — CBC
HCT: 36.1 % (ref 36.0–46.0)
Hemoglobin: 11.6 g/dL — ABNORMAL LOW (ref 12.0–15.0)
MCH: 28.1 pg (ref 26.0–34.0)
MCHC: 32.1 g/dL (ref 30.0–36.0)
MCV: 87.4 fL (ref 78.0–100.0)
RBC: 4.13 MIL/uL (ref 3.87–5.11)

## 2011-11-18 LAB — CARDIAC PANEL(CRET KIN+CKTOT+MB+TROPI)
Relative Index: INVALID (ref 0.0–2.5)
Total CK: 45 U/L (ref 7–177)

## 2011-11-18 NOTE — ED Notes (Addendum)
Patient woke with a funny feeling in her head at 0645.  She check her bp at home and was found to be hypertensive.  Patient alert and oriented.  Patient bp 208/130.  She denies headache,  Denies any vision changes.  Patient states she has a funny feeling in her head.  Stroke screen is negative.  Patient with noted afib on monitor,  No hx of same,  Patient converted enroute to nsr

## 2011-11-18 NOTE — ED Notes (Signed)
Pt d/c home with pt daughter. NAD noted at this time.

## 2011-11-18 NOTE — ED Provider Notes (Signed)
History     CSN: [id number]  Arrival date & time 11/18/11  9147   First MD Initiated Contact with Patient 11/18/11 0940      Chief Complaint  Patient presents with  . Hypertension    (Consider location/radiation/quality/duration/timing/severity/associated sxs/prior treatment) HPI The patient presents with headache, and concerns of blood pressure elevation. She notes that for the past 4 days she's had a mild headache. Headache began insidiously, since onset has been frontal, mild, probably. She denies any associated visual changes, ataxia, discoordination, or other notable complaints. She denies trying OTC medications for her headache, and there were no clear exacerbating factors. Today after her headache was persistent, she checked her blood pressure and found her systolic blood pressure to be greater than 200. She took her blood pressure medications, he notes that the readings decreased. On exam the patient has no complaints. Past Medical History  Diagnosis Date  . Meningitis   . Cataract   . Macular degeneration     Past Surgical History  Procedure Date  . Abdominal hysterectomy   . Mastectomy   . Coronary stent placement   . Colon resection     History reviewed. No pertinent family history.  History  Substance Use Topics  . Smoking status: Never Smoker   . Smokeless tobacco: Not on file  . Alcohol Use: No    OB History    Grav Para Term Preterm Abortions TAB SAB Ect Mult Living                  Review of Systems  Constitutional:       HPI  HENT:       HPI otherwise negative  Eyes: Negative.   Respiratory:       HPI, otherwise negative  Cardiovascular:       HPI, otherwise nmegative  Gastrointestinal: Negative for vomiting.  Genitourinary:       HPI, otherwise negative  Musculoskeletal:       HPI, otherwise negative  Skin: Negative.   Neurological: Negative for syncope.    Allergies  Review of patient's allergies indicates no known  allergies.  Home Medications   Current Outpatient Rx  Name Route Sig Dispense Refill  . ALPHA LIPOIC ACID PO Oral Take 200 mg by mouth daily.      . ASPIRIN EC 81 MG PO TBEC Oral Take 81 mg by mouth daily.      . ATORVASTATIN CALCIUM 40 MG PO TABS Oral Take 10 mg by mouth every other day.      . B COMPLEX-C PO TABS Oral Take 1 tablet by mouth daily.      Marland Kitchen VITAMIN D 1000 UNITS PO TABS Oral Take 1,000 Units by mouth daily. Patient take 1000 units of vitamin d along with 8000 units to make a total of 9000 units daily     . VITAMIN D 2000 UNITS PO TABS Oral Take 8,000 Units by mouth daily. Take 1000 units along with 8000 units to make a total of 9000 units each day     . KELP PO Oral Take 1 tablet by mouth daily.      . ACIDOPHILUS PO Oral Take 1 capsule by mouth daily.      Marland Kitchen LATANOPROST 0.005 % OP SOLN Both Eyes Place 1 drop into both eyes at bedtime.      Marland Kitchen LOSARTAN POTASSIUM 100 MG PO TABS Oral Take 50 mg by mouth 2 (two) times daily.      Marland Kitchen MAGNESIUM  250 MG PO TABS Oral Take 250 mg by mouth daily.      . MELOXICAM 15 MG PO TABS Oral Take 15 mg by mouth daily.      Marland Kitchen METOPROLOL SUCCINATE ER 200 MG PO TB24 Oral Take 100 mg by mouth 2 (two) times daily.      Marland Kitchen OVER THE COUNTER MEDICATION Oral Take 75 mg by mouth daily. Zinc 75mg      . OVER THE COUNTER MEDICATION Oral Take 1 capsule by mouth every evening. Herbavision. Hold while in hospital     . VITAMIN B-1 50 MG PO TABS Oral Take 50 mg by mouth daily.      Marland Kitchen TIMOLOL HEMIHYDRATE 0.5 % OP SOLN Both Eyes Place 2 drops into both eyes daily.      . AMOXICILLIN 500 MG PO CAPS Oral Take 2,000 mg by mouth daily. Patient only takes prior to dental procedures. She goes every 3 months       BP 157/80  Pulse 60  Temp(Src) 97.8 F (36.6 C) (Oral)  Resp 16  SpO2 97%  Physical Exam  Nursing note and vitals reviewed. Constitutional: She is oriented to person, place, and time. She appears well-developed and well-nourished. No distress.  HENT:   Head: Normocephalic and atraumatic.  Eyes: Conjunctivae and EOM are normal.  Cardiovascular: Normal rate and regular rhythm.   Murmur heard. Pulmonary/Chest: Effort normal and breath sounds normal. No stridor. No respiratory distress.  Abdominal: She exhibits no distension.  Musculoskeletal: She exhibits no edema.  Neurological: She is alert and oriented to person, place, and time. No cranial nerve deficit.  Skin: Skin is warm and dry.  Psychiatric: She has a normal mood and affect.    ED Course  Procedures (including critical care time)  Labs Reviewed  CBC - Abnormal; Notable for the following:    Hemoglobin 11.6 (*)    All other components within normal limits  COMPREHENSIVE METABOLIC PANEL - Abnormal; Notable for the following:    Glucose, Bld 119 (*)    Albumin 3.1 (*)    GFR calc non Af Amer 76 (*)    GFR calc Af Amer 88 (*)    All other components within normal limits  CARDIAC PANEL(CRET KIN+CKTOT+MB+TROPI)  MAGNESIUM   No results found.   No diagnosis found.  Cardiac monitor 65 sinus rhythm normal Pulse ox 97% room air normal   Date: 11/18/2011  Rate: 59  Rhythm: normal sinus rhythm  QRS Axis: normal  Intervals: normal  ST/T Wave abnormalities: nonspecific T wave changes  Conduction Disutrbances:none  Narrative Interpretation:   Old EKG Reviewed: unchanged ABN ECG   MDM  This 77 year old female presents after several days of headache with concerns over hypertension. On initial exam the patient has no complaints and has no notable physical exam findings. The patient's initial vital signs were unremarkable, and her blood pressure remained abnormal though not notably elevated, 25% lower than prior to arrival. The patient's labs do not demonstrate evidence of endorgan dysfunction, and the patient's headache is not present. Given his absence of notable findings, x-ray that does not initiate acute opacification, and the patient's improvement, she was counseled on  continuing to take her blood pressure medications as directed, and will be discharged to follow up with her primary care physician        Gerhard Munch, MD 11/18/11 1234

## 2011-11-18 NOTE — ED Notes (Signed)
Patient transported to X-ray 

## 2012-03-08 ENCOUNTER — Other Ambulatory Visit (HOSPITAL_COMMUNITY): Payer: Self-pay | Admitting: Internal Medicine

## 2012-03-08 DIAGNOSIS — Z1231 Encounter for screening mammogram for malignant neoplasm of breast: Secondary | ICD-10-CM

## 2012-09-25 ENCOUNTER — Ambulatory Visit (HOSPITAL_COMMUNITY)
Admission: RE | Admit: 2012-09-25 | Discharge: 2012-09-25 | Disposition: A | Discharge status: Home / Self Care | Payer: Medicare Other | Source: Ambulatory Visit | Attending: Internal Medicine | Admitting: Internal Medicine

## 2012-09-25 DIAGNOSIS — Z1231 Encounter for screening mammogram for malignant neoplasm of breast: Secondary | ICD-10-CM

## 2012-10-07 ENCOUNTER — Other Ambulatory Visit: Payer: Self-pay | Admitting: Internal Medicine

## 2012-10-07 DIAGNOSIS — R928 Other abnormal and inconclusive findings on diagnostic imaging of breast: Secondary | ICD-10-CM

## 2012-10-17 ENCOUNTER — Ambulatory Visit
Admission: RE | Admit: 2012-10-17 | Discharge: 2012-10-17 | Disposition: A | Discharge status: Home / Self Care | Payer: Medicare Other | Source: Ambulatory Visit | Attending: Internal Medicine | Admitting: Internal Medicine

## 2012-10-17 DIAGNOSIS — R928 Other abnormal and inconclusive findings on diagnostic imaging of breast: Secondary | ICD-10-CM

## 2013-07-15 ENCOUNTER — Other Ambulatory Visit (HOSPITAL_COMMUNITY): Payer: Self-pay | Admitting: Internal Medicine

## 2013-07-15 DIAGNOSIS — Z1231 Encounter for screening mammogram for malignant neoplasm of breast: Secondary | ICD-10-CM

## 2013-08-05 ENCOUNTER — Other Ambulatory Visit: Payer: Self-pay | Admitting: Ophthalmology

## 2013-09-22 ENCOUNTER — Ambulatory Visit (HOSPITAL_COMMUNITY)
Admission: RE | Admit: 2013-09-22 | Discharge: 2013-09-22 | Disposition: A | Discharge status: Home / Self Care | Payer: Medicare Other | Source: Ambulatory Visit | Attending: Internal Medicine | Admitting: Internal Medicine

## 2013-09-22 ENCOUNTER — Other Ambulatory Visit (HOSPITAL_COMMUNITY): Payer: Self-pay | Admitting: Internal Medicine

## 2013-09-22 ENCOUNTER — Other Ambulatory Visit: Payer: Self-pay | Admitting: Internal Medicine

## 2013-09-22 DIAGNOSIS — Z1231 Encounter for screening mammogram for malignant neoplasm of breast: Secondary | ICD-10-CM

## 2013-10-06 ENCOUNTER — Ambulatory Visit (HOSPITAL_COMMUNITY)
Admission: RE | Admit: 2013-10-06 | Discharge: 2013-10-06 | Disposition: A | Discharge status: Home / Self Care | Payer: Medicare Other | Source: Ambulatory Visit | Attending: Internal Medicine | Admitting: Internal Medicine

## 2013-10-06 ENCOUNTER — Other Ambulatory Visit: Payer: Self-pay | Admitting: Internal Medicine

## 2013-10-06 DIAGNOSIS — Z1231 Encounter for screening mammogram for malignant neoplasm of breast: Secondary | ICD-10-CM | POA: Insufficient documentation

## 2013-10-12 ENCOUNTER — Encounter: Payer: Self-pay | Admitting: Internal Medicine

## 2013-10-12 DIAGNOSIS — E785 Hyperlipidemia, unspecified: Secondary | ICD-10-CM | POA: Insufficient documentation

## 2013-10-12 DIAGNOSIS — K219 Gastro-esophageal reflux disease without esophagitis: Secondary | ICD-10-CM | POA: Insufficient documentation

## 2013-10-12 DIAGNOSIS — R7309 Other abnormal glucose: Secondary | ICD-10-CM | POA: Insufficient documentation

## 2013-10-12 DIAGNOSIS — H409 Unspecified glaucoma: Secondary | ICD-10-CM | POA: Insufficient documentation

## 2013-10-12 DIAGNOSIS — Z79899 Other long term (current) drug therapy: Secondary | ICD-10-CM | POA: Insufficient documentation

## 2013-10-12 DIAGNOSIS — E559 Vitamin D deficiency, unspecified: Secondary | ICD-10-CM | POA: Insufficient documentation

## 2013-10-12 NOTE — Progress Notes (Signed)
Patient ID: [name], female   DOB: 1922-06-30, 79 y.o.   MRN: [id number]  Annual Screening Comprehensive Examination  This very nice 79 yo spry WWF presents for complete physical.  Patient has been followed for HTN since 1989,  Prediabetes since October 2012, Hyperlipidemia, and Vitamin D Deficiency.   Patient's BP has been controlled at home. Patient denies any cardiac symptoms as chest pain, palpitations, shortness of breath, dizziness or ankle swelling.   Patient's hyperlipidemia is controlled with diet and medications. Patient denies myalgias or other medication SE's. Last cholesterol last visit was 121  , triglycerides 195, HDL 38 and LDL 49 - all at goal in august.   Patient has prediabetes/insulin resistance with last A1c 5.5% in august. Patient denies reactive hypoglycemic symptoms, visual blurring, diabetic polys, or paresthesias.    Finally, patient has history of Vitamin D Deficiency with last vitamin D 78 in august.   Current outpatient prescriptions:ALPHA LIPOIC ACID PO, Take 200 mg by mouth daily.  , Disp: , Rfl: ;  amoxicillin (AMOXIL) 500 MG capsule, Take 2,000 mg by mouth daily. Patient only takes prior to dental procedures. She goes every 3 months , Disp: , Rfl: ;  aspirin EC 81 MG tablet, Take 81 mg by mouth daily.  , Disp: , Rfl: ;  atorvastatin (LIPITOR) 40 MG tablet, Take 10 mg by mouth every other day.  , Disp: , Rfl:  B Complex-C (B-COMPLEX WITH VITAMIN C) tablet, Take 1 tablet by mouth daily.  , Disp: , Rfl: ;  cholecalciferol (VITAMIN D) 1000 UNITS tablet, Take 1,000 Units by mouth daily. Patient take 1000 units of vitamin d along with 8000 units to make a total of 9000 units daily , Disp: , Rfl:  Cholecalciferol (VITAMIN D) 2000 UNITS tablet, Take 8,000 Units by mouth daily. Take 1000 units along with 8000 units to make a total of 9000 units each day , Disp: , Rfl: ;  Iodine, Kelp, (KELP PO), Take 1 tablet by mouth daily.  , Disp: , Rfl: ;  Lactobacillus  (ACIDOPHILUS PO), Take 1 capsule by mouth daily.  , Disp: , Rfl: ;  latanoprost (XALATAN) 0.005 % ophthalmic solution, Place 1 drop into both eyes at bedtime.  , Disp: , Rfl:  losartan (COZAAR) 100 MG tablet, Take 50 mg by mouth 2 (two) times daily.  , Disp: , Rfl: ;  Magnesium 250 MG TABS, Take 250 mg by mouth daily.  , Disp: , Rfl: ;  meloxicam (MOBIC) 15 MG tablet, Take 15 mg by mouth daily.  , Disp: , Rfl: ;  metoprolol (TOPROL-XL) 200 MG 24 hr tablet, Take 100 mg by mouth 2 (two) times daily.  , Disp: , Rfl: ;  OVER THE COUNTER MEDICATION, Take 75 mg by mouth daily. Zinc 75mg  , Disp: , Rfl:  OVER THE COUNTER MEDICATION, Take 1 capsule by mouth every evening. Herbavision. Hold while in hospital , Disp: , Rfl: ;  thiamine (VITAMIN B-1) 50 MG tablet, Take 50 mg by mouth daily.  , Disp: , Rfl: ;  timolol (BETIMOL) 0.5 % ophthalmic solution, Place 2 drops into both eyes daily.  , Disp: , Rfl:     Allergies  Allergen Reactions  . Lactose Intolerance (Gi)     Past Medical History  Diagnosis Date  . Meningitis   . Macular degeneration   . Hypertension   . GERD (gastroesophageal reflux disease)   . Hyperlipidemia   . Elevated hemoglobin A1c   . Glaucoma   .  Cataract   . Cancer 1979 mastectomy    Past Surgical History  Procedure Laterality Date  . Abdominal hysterectomy    . Mastectomy    . Coronary stent placement    . Colon resection    . Eye surgery  1973    Dr. Dione Booze  . Breast surgery Left 1979    mastectomy  . Skin cancer excision  2008     Squamous cell - left thigh    Family History  Problem Relation Age of Onset  . Hypertension Mother   . Hypertension Father   . CVA Father   . Arthritis Sister     Rheumatoid  . Heart attack Brother   . Heart disease Brother   . Diabetes Daughter   . Hypertension Daughter     History   Social History  . Marital Status: Widowed    Number of Children: 1 daughter 52 yo w/ Alzheimers in [place]   Occupational History  homemaker      Social History Main Topics  . Smoking status: Never Smoker   . Smokeless tobacco: No  . Alcohol Use: No     ROS Constitutional: Denies fever, chills, weight loss/gain, headaches, insomnia, fatigue, night sweats, and change in appetite. Eyes: Denies redness, blurred vision, diplopia, discharge, itchy, watery eyes.  ENT: Denies discharge, congestion, post nasal drip, epistaxis, sore throat, earache,  dental pain, Tinnitus, Vertigo, Sinus pain, snoring.  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 Cardio: Denies chest pain, palpitations, irregular heartbeat, syncope, dyspnea, diaphoresis, orthopnea, PND, claudication, edema Respiratory: denies cough, dyspnea, DOE, pleurisy, hoarseness, laryngitis, wheezing.  Gastrointestinal: Denies dysphagia, heartburn, reflux, water brash, pain, cramps, nausea, vomiting, bloating, diarrhea, constipation, hematemesis, melena, hematochezia, jaundice, hemorrhoids Genitourinary: Denies dysuria, frequency, urgency, nocturia, hesitancy, discharge, hematuria, flank pain Breast:No breast lumps, nipple discharge, bleeding.  Musculoskeletal: Denies arthralgia, myalgia, stiffness, Jt. Swelling, pain, limp, and strain/sprain. Skin: Denies puritis, rash, hives, warts, acne, eczema, changing in skin lesion Neuro: Weakness, tremor, incoordination, spasms, paresthesia, pain Psychiatric: Denies confusion, memory loss, sensory loss Endocrine: Denies change in weight, skin, hair change, nocturia, and paresthesia, diabetic polys, visual blurring, hyper /hypo glycemic episodes.  Heme/Lymph: No excessive bleeding, bruising, enlarged lymph nodes.   VS as recorded find sys BP borderline elevated - rechecked at 134/76.  Physical Exam General Appearance: Well nourished, in no apparent distress. Eyes: Anisocoria with RT pupil eccentric and >> Lt pupil. EOMs Nl and full. Conjunctiva no swelling or erythema, normal fundi and vessels. Sinuses: No frontal/maxillary  tenderness ENT/Mouth: EACs patent / TMs  nl. Nares clear without erythema, swelling, mucoid exudates. Oral hygiene is good. No erythema, swelling, or exudate. Tongue normal, non-obstructing. Tonsils not swollen or erythematous. Hearing normal.  Neck: Supple, thyroid normal. No bruits, nodes or JVD. Respiratory: Respiratory effort normal.  BS equal and clear bilateral without rales, rhonci, wheezing or stridor. Cardio: Heart sounds are normal with regular rate and rhythm and no murmurs, rubs or gallops. Peripheral pulses are normal and equal bilaterally without edema. No aortic or femoral bruits. Chest: symmetric with normal excursions and percussion. Breasts: Rt. Breast without lumps, nipple discharge, retractions, or fibrocystic changes. Lt. Breast surgicall absent. Abdomen: Flat, soft, with bowl sounds. Nontender, no guarding, rebound, hernias, masses, or organomegaly.  Lymphatics: Non tender without lymphadenopathy.  Genitourinary:  Musculoskeletal: Full ROM all peripheral extremities, joint stability, 5/5 strength, and normal gait. Skin: Warm and dry without rashes, lesions, cyanosis, clubbing or  ecchymosis.  Neuro: Cranial nerves intact, reflexes equal bilaterally. Normal  muscle tone, no cerebellar symptoms. Sensation intact.  Pysch: Awake and oriented X 3, normal affect, Insight and Judgment appropriate.   Assessment and Plan  1. Annual Screening Examination 2. Hypertension  3. Hyperlipidemia 4. Pre Diabetes 5. Vitamin D Deficiency 5. Glaucoma  Continue prudent diet as discussed, weight control, regular exercise, and medications. Routine screening labs and tests as requested with regular follow-up as recommended.

## 2013-10-12 NOTE — Patient Instructions (Addendum)
Continue diet & medications same. As discussed.   Further disposition pending lab results.   Hypertension As your heart beats, it forces blood through your arteries. This force is your blood pressure. If the pressure is too high, it is called hypertension (HTN) or high blood pressure. HTN is dangerous because you may have it and not know it. High blood pressure may mean that your heart has to work harder to pump blood. Your arteries may be narrow or stiff. The extra work puts you at risk for heart disease, stroke, and other problems.  Blood pressure consists of two numbers, a higher number over a lower, 110/72, for example. It is stated as "110 over 72." The ideal is below 120 for the top number (systolic) and under 80 for the bottom (diastolic). Write down your blood pressure today. You should pay close attention to your blood pressure if you have certain conditions such as:  Heart failure.  Prior heart attack.  Diabetes  Chronic kidney disease.  Prior stroke.  Multiple risk factors for heart disease. To see if you have HTN, your blood pressure should be measured while you are seated with your arm held at the level of the heart. It should be measured at least twice. A one-time elevated blood pressure reading (especially in the Emergency Department) does not mean that you need treatment. There may be conditions in which the blood pressure is different between your right and left arms. It is important to see your caregiver soon for a recheck. Most people have essential hypertension which means that there is not a specific cause. This type of high blood pressure may be lowered by changing lifestyle factors such as:  Stress.  Smoking.  Lack of exercise.  Excessive weight.  Drug/tobacco/alcohol use.  Eating less salt. Most people do not have symptoms from high blood pressure until it has caused damage to the body. Effective treatment can often prevent, delay or reduce that  damage. TREATMENT  When a cause has been identified, treatment for high blood pressure is directed at the cause. There are a large number of medications to treat HTN. These fall into several categories, and your caregiver will help you select the medicines that are best for you. Medications may have side effects. You should review side effects with your caregiver. If your blood pressure stays high after you have made lifestyle changes or started on medicines,   Your medication(s) may need to be changed.  Other problems may need to be addressed.  Be certain you understand your prescriptions, and know how and when to take your medicine.  Be sure to follow up with your caregiver within the time frame advised (usually within two weeks) to have your blood pressure rechecked and to review your medications.  If you are taking more than one medicine to lower your blood pressure, make sure you know how and at what times they should be taken. Taking two medicines at the same time can result in blood pressure that is too low. SEEK IMMEDIATE MEDICAL CARE IF:  You develop a severe headache, blurred or changing vision, or confusion.  You have unusual weakness or numbness, or a faint feeling.  You have severe chest or abdominal pain, vomiting, or breathing problems. MAKE SURE YOU:   Understand these instructions.  Will watch your condition.  Will get help right away if you are not doing well or get worse. Document Released: 11/13/2005 Document Revised: 02/05/2012 Document Reviewed: 07/03/2008 [facility] Patient Information 2014 [place], [place].  Cholesterol Cholesterol is a white, waxy, fat-like protein needed by your body in small amounts. The liver makes all the cholesterol you need. It is carried from the liver by the blood through the blood vessels. Deposits (plaque) may build up on blood vessel walls. This makes the arteries narrower and stiffer. Plaque increases the risk for heart attack and  stroke. You cannot feel your cholesterol level even if it is very high. The only way to know is by a blood test to check your lipid (fats) levels. Once you know your cholesterol levels, you should keep a record of the test results. Work with your caregiver to to keep your levels in the desired range. WHAT THE RESULTS MEAN:  Total cholesterol is a rough measure of all the cholesterol in your blood.  LDL is the so-called bad cholesterol. This is the type that deposits cholesterol in the walls of the arteries. You want this level to be low.  HDL is the good cholesterol because it cleans the arteries and carries the LDL away. You want this level to be high.  Triglycerides are fat that the body can either burn for energy or store. High levels are closely linked to heart disease. DESIRED LEVELS:  Total cholesterol below 200.  LDL below 100 for people at risk, below 70 for very high risk.  HDL above 50 is good, above 60 is best.  Triglycerides below 150. HOW TO LOWER YOUR CHOLESTEROL:  Diet.  Choose fish or white meat chicken and [place], roasted or baked. Limit fatty cuts of red meat, fried foods, and processed meats, such as sausage and lunch meat.  Eat lots of fresh fruits and vegetables. Choose whole grains, beans, pasta, potatoes and cereals.  Use only small amounts of olive, corn or canola oils. Avoid butter, mayonnaise, shortening or palm kernel oils. Avoid foods with trans-fats.  Use skim/nonfat milk and low-fat/nonfat yogurt and cheeses. Avoid whole milk, cream, ice cream, egg yolks and cheeses. Healthy desserts include angel food cake, ginger snaps, animal crackers, hard candy, popsicles, and low-fat/nonfat frozen yogurt. Avoid pastries, cakes, pies and cookies.  Exercise.  A regular program helps decrease LDL and raises HDL.  Helps with weight control.  Do things that increase your activity level like gardening, walking, or taking the stairs.  Medication.  May be  prescribed by your caregiver to help lowering cholesterol and the risk for heart disease.  You may need medicine even if your levels are normal if you have several risk factors. HOME CARE INSTRUCTIONS   Follow your diet and exercise programs as suggested by your caregiver.  Take medications as directed.  Have blood work done when your caregiver feels it is necessary. MAKE SURE YOU:   Understand these instructions.  Will watch your condition.  Will get help right away if you are not doing well or get worse. Document Released: 08/08/2001 Document Revised: 02/05/2012 Document Reviewed: 01/29/2008 [facility] Patient Information 2014 [place], [place].    Diabetes and Exercise Exercising regularly is important. It is not just about losing weight. It has many health benefits, such as:  Improving your overall fitness, flexibility, and endurance.  Increasing your bone density.  Helping with weight control.  Decreasing your body fat.  Increasing your muscle strength.  Reducing stress and tension.  Improving your overall health. People with diabetes who exercise gain additional benefits because exercise:  Reduces appetite.  Improves the body's use of blood sugar (glucose).  Helps lower or control blood glucose.  Decreases blood pressure.  Helps control blood lipids (such as cholesterol and triglycerides).  Improves the body's use of the hormone insulin by:  Increasing the body's insulin sensitivity.  Reducing the body's insulin needs.  Decreases the risk for heart disease because exercising:  Lowers cholesterol and triglycerides levels.  Increases the levels of good cholesterol (such as high-density lipoproteins [HDL]) in the body.  Lowers blood glucose levels. YOUR ACTIVITY PLAN  Choose an activity that you enjoy and set realistic goals. Your health care provider or diabetes educator can help you make an activity plan that works for you. You can break activities  into 2 or 3 sessions throughout the day. Doing so is as good as one long session. Exercise ideas include:  Taking the dog for a walk.  Taking the stairs instead of the elevator.  Dancing to your favorite song.  Doing your favorite exercise with a friend. RECOMMENDATIONS FOR EXERCISING WITH TYPE 1 OR TYPE 2 DIABETES   Check your blood glucose before exercising. If blood glucose levels are greater than 240 mg/dL, check for urine ketones. Do not exercise if ketones are present.  Avoid injecting insulin into areas of the body that are going to be exercised. For example, avoid injecting insulin into:  The arms when playing tennis.  The legs when jogging.  Keep a record of:  Food intake before and after you exercise.  Expected peak times of insulin action.  Blood glucose levels before and after you exercise.  The type and amount of exercise you have done.  Review your records with your health care provider. Your health care provider will help you to develop guidelines for adjusting food intake and insulin amounts before and after exercising.  If you take insulin or oral hypoglycemic agents, watch for signs and symptoms of hypoglycemia. They include:  Dizziness.  Shaking.  Sweating.  Chills.  Confusion.  Drink plenty of water while you exercise to prevent dehydration or heat stroke. Body water is lost during exercise and must be replaced.  Talk to your health care provider before starting an exercise program to make sure it is safe for you. Remember, almost any type of activity is better than none. Document Released: 02/03/2004 Document Revised: 07/16/2013 Document Reviewed: 04/22/2013 [facility] Patient Information 2014 [place], [place].   Vitamin D Deficiency Vitamin D is an important vitamin that your body needs. Having too little of it in your body is called a deficiency. A very bad deficiency can make your bones soft and can cause a condition called rickets.  Vitamin D  is important to your body for different reasons, such as:   It helps your body absorb 2 minerals called calcium and phosphorus.  It helps make your bones healthy.  It may prevent some diseases, such as diabetes and multiple sclerosis.  It helps your muscles and heart. You can get vitamin D in several ways. It is a natural part of some foods. The vitamin is also added to some dairy products and cereals. Some people take vitamin D supplements. Also, your body makes vitamin D when you are in the sun. It changes the sun's rays into a form of the vitamin that your body can use. CAUSES   Not eating enough foods that contain vitamin D.  Not getting enough sunlight.  Having certain digestive system diseases that make it hard to absorb vitamin D. These diseases include Crohn's disease, chronic pancreatitis, and cystic fibrosis.  Having a surgery in which part of the stomach or small intestine is removed.  Being obese. Fat cells pull vitamin D out of your blood. That means that obese people may not have enough vitamin D left in their blood and in other body tissues.  Having chronic kidney or liver disease. RISK FACTORS Risk factors are things that make you more likely to develop a vitamin D deficiency. They include:  Being older.  Not being able to get outside very much.  Living in a nursing home.  Having had broken bones.  Having weak or thin bones (osteoporosis).  Having a disease or condition that changes how your body absorbs vitamin D.  Having dark skin.  Some medicines such as seizure medicines or steroids.  Being overweight or obese. SYMPTOMS Mild cases of vitamin D deficiency may not have any symptoms. If you have a very bad case, symptoms may include:  Bone pain.  Muscle pain.  Falling often.  Broken bones caused by a minor injury, due to osteoporosis. DIAGNOSIS A blood test is the best way to tell if you have a vitamin D deficiency. TREATMENT Vitamin D  deficiency can be treated in different ways. Treatment for vitamin D deficiency depends on what is causing it. Options include:  Taking vitamin D supplements.  Taking a calcium supplement. Your caregiver will suggest what dose is best for you. HOME CARE INSTRUCTIONS  Take any supplements that your caregiver prescribes. Follow the directions carefully. Take only the suggested amount.  Have your blood tested 2 months after you start taking supplements.  Eat foods that contain vitamin D. Healthy choices include:  Fortified dairy products, cereals, or juices. Fortified means vitamin D has been added to the food. Check the label on the package to be sure.  Fatty fish like salmon or trout.  Eggs.  Oysters.  Do not use a tanning bed.  Keep your weight at a healthy level. Lose weight if you need to.  Keep all follow-up appointments. Your caregiver will need to perform blood tests to make sure your vitamin D deficiency is going away. SEEK MEDICAL CARE IF:  You have any questions about your treatment.  You continue to have symptoms of vitamin D deficiency.  You have nausea or vomiting.  You are constipated.  You feel confused.  You have severe abdominal or back pain. MAKE SURE YOU:  Understand these instructions.  Will watch your condition.  Will get help right away if you are not doing well or get worse. Document Released: 02/05/2012 Document Revised: 03/10/2013 Document Reviewed: 02/05/2012 [employer] Patient Information 2014 [employer].  

## 2013-10-13 ENCOUNTER — Encounter: Payer: Self-pay | Admitting: Internal Medicine

## 2013-10-13 ENCOUNTER — Ambulatory Visit: Payer: Medicare Other | Admitting: Internal Medicine

## 2013-10-13 VITALS — BP 146/74 | HR 56 | Temp 97.2°F | Resp 18 | Ht 63.0 in | Wt 161.2 lb

## 2013-10-13 DIAGNOSIS — Z79899 Other long term (current) drug therapy: Secondary | ICD-10-CM

## 2013-10-13 DIAGNOSIS — Z0000 Encounter for general adult medical examination without abnormal findings: Secondary | ICD-10-CM

## 2013-10-13 DIAGNOSIS — E559 Vitamin D deficiency, unspecified: Secondary | ICD-10-CM

## 2013-10-13 DIAGNOSIS — I10 Essential (primary) hypertension: Secondary | ICD-10-CM

## 2013-10-13 DIAGNOSIS — E785 Hyperlipidemia, unspecified: Secondary | ICD-10-CM

## 2013-10-13 DIAGNOSIS — Z23 Encounter for immunization: Secondary | ICD-10-CM

## 2013-10-13 DIAGNOSIS — Z1212 Encounter for screening for malignant neoplasm of rectum: Secondary | ICD-10-CM

## 2013-10-13 DIAGNOSIS — R7309 Other abnormal glucose: Secondary | ICD-10-CM

## 2013-10-13 LAB — CBC WITH DIFFERENTIAL/PLATELET
Eosinophils Absolute: 0.2 10*3/uL (ref 0.0–0.7)
Eosinophils Relative: 3 % (ref 0–5)
HCT: 40.2 % (ref 36.0–46.0)
Hemoglobin: 13.4 g/dL (ref 12.0–15.0)
Lymphocytes Relative: 25 % (ref 12–46)
Lymphs Abs: 1.2 10*3/uL (ref 0.7–4.0)
MCH: 29.9 pg (ref 26.0–34.0)
MCV: 89.7 fL (ref 78.0–100.0)
Monocytes Absolute: 0.4 10*3/uL (ref 0.1–1.0)
Platelets: 250 10*3/uL (ref 150–400)
RBC: 4.48 MIL/uL (ref 3.87–5.11)
RDW: 14.6 % (ref 11.5–15.5)
WBC: 4.6 10*3/uL (ref 4.0–10.5)

## 2013-10-13 LAB — BASIC METABOLIC PANEL WITH GFR
CO2: 29 mEq/L (ref 19–32)
Calcium: 9.6 mg/dL (ref 8.4–10.5)
Creat: 1.12 mg/dL — ABNORMAL HIGH (ref 0.50–1.10)
GFR, Est African American: 50 mL/min — ABNORMAL LOW
Potassium: 3.6 mEq/L (ref 3.5–5.3)

## 2013-10-13 LAB — LIPID PANEL
Cholesterol: 138 mg/dL (ref 0–200)
HDL: 44 mg/dL (ref >39)
Total CHOL/HDL Ratio: 3.1 Ratio
Triglycerides: 186 mg/dL — ABNORMAL HIGH (ref <150)

## 2013-10-13 LAB — HEPATIC FUNCTION PANEL
ALT: 20 U/L (ref 0–35)
AST: 22 U/L (ref 0–37)
Albumin: 3.8 g/dL (ref 3.5–5.2)
Total Bilirubin: 0.6 mg/dL (ref 0.3–1.2)
Total Protein: 6.3 g/dL (ref 6.0–8.3)

## 2013-10-13 LAB — MAGNESIUM: Magnesium: 2.2 mg/dL (ref 1.5–2.5)

## 2013-10-13 LAB — HEMOGLOBIN A1C: Mean Plasma Glucose: 123 mg/dL — ABNORMAL HIGH (ref <117)

## 2013-10-14 LAB — URINALYSIS, MICROSCOPIC ONLY
Crystals: NONE SEEN
Squamous Epithelial / LPF: NONE SEEN

## 2013-10-14 LAB — INSULIN, FASTING: Insulin fasting, serum: 56 u[IU]/mL — ABNORMAL HIGH (ref 3–28)

## 2013-10-14 LAB — MICROALBUMIN / CREATININE URINE RATIO
Creatinine, Urine: 72.6 mg/dL
Microalb Creat Ratio: 6.9 mg/g (ref 0.0–30.0)

## 2013-12-07 ENCOUNTER — Other Ambulatory Visit: Payer: Self-pay | Admitting: Internal Medicine

## 2013-12-22 ENCOUNTER — Other Ambulatory Visit: Payer: Self-pay | Admitting: *Deleted

## 2013-12-22 ENCOUNTER — Other Ambulatory Visit: Payer: Self-pay | Admitting: Internal Medicine

## 2013-12-22 MED ORDER — DILTIAZEM HCL ER 120 MG PO CP12: 120.0000 mg | ORAL_CAPSULE | Freq: Two times a day (BID) | ORAL | Status: DC

## 2014-01-05 ENCOUNTER — Other Ambulatory Visit: Payer: Self-pay | Admitting: Physician Assistant

## 2014-01-05 MED ORDER — MELOXICAM 15 MG PO TABS: ORAL_TABLET | ORAL | Status: DC

## 2014-01-11 ENCOUNTER — Other Ambulatory Visit: Payer: Self-pay | Admitting: Physician Assistant

## 2014-01-21 ENCOUNTER — Encounter: Payer: Self-pay | Admitting: Physician Assistant

## 2014-01-21 ENCOUNTER — Ambulatory Visit (INDEPENDENT_AMBULATORY_CARE_PROVIDER_SITE_OTHER): Payer: Medicare Other | Admitting: Physician Assistant

## 2014-01-21 VITALS — BP 110/78 | HR 60 | Temp 97.9°F | Resp 16 | Ht 63.0 in | Wt 161.0 lb

## 2014-01-21 DIAGNOSIS — I10 Essential (primary) hypertension: Secondary | ICD-10-CM

## 2014-01-21 DIAGNOSIS — H409 Unspecified glaucoma: Secondary | ICD-10-CM

## 2014-01-21 DIAGNOSIS — E559 Vitamin D deficiency, unspecified: Secondary | ICD-10-CM

## 2014-01-21 DIAGNOSIS — Z0000 Encounter for general adult medical examination without abnormal findings: Secondary | ICD-10-CM

## 2014-01-21 DIAGNOSIS — R7309 Other abnormal glucose: Secondary | ICD-10-CM

## 2014-01-21 DIAGNOSIS — Z79899 Other long term (current) drug therapy: Secondary | ICD-10-CM

## 2014-01-21 DIAGNOSIS — Z23 Encounter for immunization: Secondary | ICD-10-CM

## 2014-01-21 DIAGNOSIS — E785 Hyperlipidemia, unspecified: Secondary | ICD-10-CM

## 2014-01-21 LAB — CBC WITH DIFFERENTIAL/PLATELET
BASOS ABS: 0 10*3/uL (ref 0.0–0.1)
Basophils Relative: 0 % (ref 0–1)
EOS PCT: 3 % (ref 0–5)
Eosinophils Absolute: 0.2 10*3/uL (ref 0.0–0.7)
HCT: 41.6 % (ref 36.0–46.0)
Hemoglobin: 13.8 g/dL (ref 12.0–15.0)
LYMPHS ABS: 1.6 10*3/uL (ref 0.7–4.0)
Lymphocytes Relative: 25 % (ref 12–46)
MCH: 30.8 pg (ref 26.0–34.0)
MCHC: 33.2 g/dL (ref 30.0–36.0)
MCV: 92.9 fL (ref 78.0–100.0)
Monocytes Absolute: 0.6 10*3/uL (ref 0.1–1.0)
Monocytes Relative: 9 % (ref 3–12)
Neutro Abs: 4 10*3/uL (ref 1.7–7.7)
Neutrophils Relative %: 63 % (ref 43–77)
Platelets: 246 10*3/uL (ref 150–400)
RBC: 4.48 MIL/uL (ref 3.87–5.11)
RDW: 13.6 % (ref 11.5–15.5)
WBC: 6.4 10*3/uL (ref 4.0–10.5)

## 2014-01-21 LAB — HEMOGLOBIN A1C
Hgb A1c MFr Bld: 5.6 % (ref <5.7)
MEAN PLASMA GLUCOSE: 114 mg/dL (ref <117)

## 2014-01-21 NOTE — Progress Notes (Signed)
Subjective:   [name] is a 80 y.o. female who presents for Medicare Annual Wellness Visit and 3 month follow up on hypertension, prediabetes, hyperlipidemia, vitamin D def.  Date of last medicare wellness visit is unknown.   Her blood pressure has been controlled at home, today their BP is BP: 110/78 mmHg She denies chest pain, shortness of breath, dizziness. She has not been taking her bumex and denies weight gain, edema, SOB, PND, orthopnea.  Her cholesterol is diet controlled. In addition they are on Lipitor and denies myalgias. Her cholesterol is controlled. The cholesterol last visit was:   Lab Results  Component Value Date   CHOL 138 10/13/2013   HDL 44 10/13/2013   LDLCALC 57 10/13/2013   TRIG 186* 10/13/2013   CHOLHDL 3.1 10/13/2013   She has been working on diet and exercise for prediabetes, and has no polyuria or polydipsia, no chest pain, dyspnea or TIAs, no numbness, tingling or pain in extremities. Last A1C in the office was:  Lab Results  Component Value Date   HGBA1C 5.9* 10/13/2013   Patient is on Vitamin D supplement.   Names of Other Physician/Practitioners you currently use: 1. [facility]- here for primary care 2. Dr. Katy Fitch, eye doctor, last visit 08/2013  upcoming visit February 05 2014 3. , dentist, last visit 08/2013 due March 2015 Patient Care Team: Unk Pinto, MD as PCP - General (Internal Medicine)   Medication Review Current Outpatient Prescriptions on File Prior to Visit  Medication Sig Dispense Refill  . ALPHA LIPOIC ACID PO Take 200 mg by mouth daily.        Marland Kitchen amoxicillin (AMOXIL) 500 MG capsule Take 2,000 mg by mouth daily. Patient only takes prior to dental procedures. She goes every 3 months       . aspirin EC 81 MG tablet Take 81 mg by mouth daily.        Marland Kitchen atorvastatin (LIPITOR) 40 MG tablet Take 10 mg by mouth every other day.        . B Complex-C (B-COMPLEX WITH VITAMIN C) tablet Take 1  tablet by mouth daily.        . bumetanide (BUMEX) 2 MG tablet Take 2 mg by mouth daily. Pt takes 1/2 tab qd      . cholecalciferol (VITAMIN D) 1000 UNITS tablet Take 1,000 Units by mouth daily. Patient take 1000 units of vitamin d along with 8000 units to make a total of 9000 units daily       . Cholecalciferol (VITAMIN D) 2000 UNITS tablet Take 8,000 Units by mouth daily. Take 1000 units along with 8000 units to make a total of 9000 units each day       . diltiazem (CARDIZEM SR) 120 MG 12 hr capsule Take 1 capsule (120 mg total) by mouth 2 (two) times daily.  180 capsule  2  . dorzolamide (TRUSOPT) 2 % ophthalmic solution 1 drop daily.      . Iodine, Kelp, (KELP PO) Take 1 tablet by mouth daily.        . isosorbide mononitrate (IMDUR) 30 MG 24 hr tablet Take 1 tablet by mouth  every night at bedtime  90 tablet  0  . Lactobacillus (ACIDOPHILUS PO) Take 1 capsule by mouth daily.        Marland Kitchen latanoprost (XALATAN) 0.005 % ophthalmic solution Place 1 drop into both eyes at bedtime.        Marland Kitchen losartan (COZAAR) 100 MG  tablet Take 50 mg by mouth 2 (two) times daily.        . meloxicam (MOBIC) 15 MG tablet Take one pill daily with food as needed for pain.  90 tablet  0  . metoprolol (TOPROL-XL) 200 MG 24 hr tablet Take 1 tablet by mouth  every day  Rx written by: Kelby Aline  90 tablet  1  . OVER THE COUNTER MEDICATION Take 75 mg by mouth daily. Zinc 75mg        . OVER THE COUNTER MEDICATION Take 1 capsule by mouth every evening. Herbavision. Hold while in hospital       . traMADol (ULTRAM) 50 MG tablet Take 50 mg by mouth every 6 (six) hours as needed (usually takes 1 a day).       No current facility-administered medications on file prior to visit.    Current Problems (verified) Patient Active Problem List   Diagnosis Date Noted  . Unspecified vitamin D deficiency 10/12/2013  . Screening for malignant neoplasm of the rectum 10/12/2013  . Routine general medical examination at a health care facility  10/12/2013  . Encounter for long-term (current) use of other medications 10/12/2013  . Hypertension   . GERD (gastroesophageal reflux disease)   . Hyperlipidemia   . Elevated hemoglobin A1c   . Glaucoma   . ABNORMAL FINDINGS GI TRACT 09/28/2008    Screening Tests Health Maintenance  Topic Date Due  . Colonoscopy  04/01/1974  . Zostavax  04/01/1984  . Tetanus/tdap  11/27/2012  . Influenza Vaccine  06/27/2014  . Pneumococcal Polysaccharide Vaccine Age 50 And Over  Completed     Immunization History  Administered Date(s) Administered  . Influenza Split 10/13/2013  . Influenza-Unspecified 11/27/2010  . Pneumococcal-Unspecified 11/27/2000, 11/27/2009  . Td 11/27/2002    Preventative care: Last colonoscopy: 2009 defer any futher due to age Last mammogram: 09/2013 MRI Last pap smear/pelvic exam: defers  DEXA: never had and declines  Prior vaccinations: TD or Tdap: 2004- DUE  Influenza: 09/2013 Pneumococcal: 2011 Shingles/Zostavax: declines  History reviewed: allergies, current medications, past family history, past medical history, past social history, past surgical history and problem list  Risk Factors: Osteoporosis: postmenopausal estrogen deficiency and glucocorticoid use History of fracture in the past year: no  Tobacco History  Smoking status  . Never Smoker   Smokeless tobacco  . Not on file   She does not smoke.  Patient is not a former smoker. Are there smokers in your home (other than you)?  No  Alcohol Current alcohol use: none  Caffeine Current caffeine use: coffee 1 /day  Exercise Exercise limitations: The patient has no exercise limitations. Current exercise: walking and yard work  Nutrition/Diet Current diet: in general, a "healthy" diet    Cardiac risk factors: advanced age (older than 51 for men, 66 for women), dyslipidemia, family history of premature cardiovascular disease, hypertension and sedentary lifestyle.  Depression  Screen Nurse depression screen reviewed.  (Note: if answer to either of the following is "Yes", a more complete depression screening is indicated)   Q1: Over the past two weeks, have you felt down, depressed or hopeless? No  Q2: Over the past two weeks, have you felt little interest or pleasure in doing things? No  Have you lost interest or pleasure in daily life? No  Do you often feel hopeless? No  Do you cry easily over simple problems? No  Activities of Daily Living Nurse ADLs screen reviewed.  In your present state of  health, do you have any difficulty performing the following activities?:  Driving? No Managing money?  No Feeding yourself? No Getting from bed to chair? No Climbing a flight of stairs? No Preparing food and eating?: No Bathing or showering? No Getting dressed: No Getting to the toilet? No Using the toilet:No Moving around from place to place: No In the past year have you fallen or had a near fall?:No   Are you sexually active?  No  Do you have more than one partner?  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 No Do you experience ringing or noises in your ears? No Do you have difficulty understanding soft or whispered voices? No  Cognition  Do you feel that you have a problem with memory? Yes states due to her age and just short term memory  Do you often misplace items? No  Do you feel safe at home?  Yes  Advanced directives Does patient have a Ritchey? Yes Does patient have a Living Will? Yes   Objective:     Vision and hearing screens reviewed.   Blood pressure 110/78, pulse 60, temperature 97.9 F (36.6 C), resp. rate 16, height 5\' 3"  (1.6 m), weight 161 lb (73.029 kg). Body mass index is 28.53 kg/(m^2).  Wt Readings from Last 3 Encounters:  01/21/14 161 lb (73.029 kg)  10/13/13 161 lb 3.2 oz (73.12  kg)  09/28/08 149 lb 9 oz (67.841 kg)    General appearance: alert, no distress, WD/WN,  female Cognitive Testing  Alert? Yes  Normal Appearance?Yes  Oriented to person? Yes  Place? Yes   Time? Yes  Recall of three objects?  No 2/3  Can perform simple calculations? Yes  Displays appropriate judgment?Yes  Can read the correct time from a watch face?Yes  HEENT: left pupil nonreative/pinpoint due to surgery. normocephalic, sclerae anicteric, TMs pearly, nares patent, no discharge or erythema, pharynx normal Oral cavity: MMM, no lesions Neck: supple, no lymphadenopathy, no thyromegaly, no masses Heart: RRR, normal S1, S2, no murmurs Lungs: CTA bilaterally, no wheezes, rhonchi, or rales Abdomen: +bs, soft, non tender, non distended, no masses, no hepatomegaly, no splenomegaly Musculoskeletal: nontender, no swelling, no obvious deformity Extremities: mild bilateral leg edema, no cyanosis, no clubbing Pulses: 2+ symmetric, upper and lower extremities, normal cap refill Neurological: alert, oriented x 3, CN2-12 intact, strength normal upper extremities and lower extremities, sensation normal throughout, DTRs 2+ throughout, no cerebellar signs, gait normal Psychiatric: normal affect, behavior normal, pleasant  Breast: defer Gyn: defer Rectal: defer   Assessment:   1. Hypertension Patient can stop Bumex for now since weight is unchanged, but she understands to get back on it if her weight goes up, will weight daily, if SOB.  - CBC with Differential - BASIC METABOLIC PANEL WITH GFR - Hepatic function panel - TSH  2. Hyperlipidemia - Lipid panel  3. Elevated hemoglobin A1c - Hemoglobin A1c - Insulin, fasting -Discussed general issues about diabetes pathophysiology and management., Educational material distributed., Suggested low cholesterol diet., Encouraged aerobic exercise., Discussed foot care., Reminded to get yearly retinal exam.  4. Glaucoma- stable, continue follow up with  Dr. Katy Fitch  5. Unspecified vitamin D deficiency  6. Encounter for long-term (current) use of other medications - Magnesium   Plan:   During the course of the visit the patient was educated and counseled about appropriate screening and  preventive services including:    Influenza vaccine  Td vaccine  Screening electrocardiogram  Screening mammography  Bone densitometry screening  Diabetes screening  Glaucoma screening  Nutrition counseling   Advanced directives: has an advanced directive - a copy HAS NOT been provided.  Screening recommendations, referrals:  Vaccinations: Tdap vaccine yes given this visit Influenza vaccine yes next year Pneumococcal vaccine not done uptodate Shingles vaccine not done, declines Hep B vaccine no, up to date  Nutrition assessed and recommended  Colonoscopy no due to age 29 yes Pap smear not done due to age Pelvic exam not done due to age Recommended yearly ophthalmology/optometry visit for glaucoma screening and checkup Recommended yearly dental visit for hygiene and checkup Advanced directives - yes- bring in copies  Conditions/risks identified: BMI: Discussed weight loss, diet, and increase physical activity.  Increase physical activity: AHA recommends 150 minutes of physical activity a week.  Medications reviewed DEXA- declined Diabetes is at goal, ACE/ARB therapy: Yes. Urinary Incontinence is not an issue: discussed non pharmacology and pharmacology options.  Fall risk: moderate- discussed PT, home fall assessment, medications.   Medicare Attestation I have personally reviewed: The patient's medical and social history Their use of alcohol, tobacco or illicit drugs Their current medications and supplements The patient's functional ability including ADLs,fall risks, home safety risks, cognitive, and hearing and visual impairment Diet and physical activities Evidence for depression or mood disorders  The patient's  weight, height, BMI, and visual acuity have been recorded in the chart.  I have made referrals, counseling, and provided education to the patient based on review of the above and I have provided the patient with a written personalized care plan for preventive services.     Vicie Mutters, PA-C   01/21/2014

## 2014-01-21 NOTE — Patient Instructions (Addendum)
You do not need to take the bumex UNLESS: Your weight is up 5-10 lbs You are having a lot of swelling You are waking up short of breath in the night You are unable to lay down flat    Bad carbs also include fruit juice, alcohol, and sweet tea. These are empty calories that do not signal to your brain that you are full.   Please remember the good carbs are still carbs which convert into sugar. So please measure them out no more than 1/2-1 cup of rice, oatmeal, pasta, and beans.  Veggies are however free foods! Pile them on.   I like lean protein at every meal such as chicken, [place], pork chops, cottage cheese, etc. Just do not fry these meats and please center your meal around vegetable, the meats should be a side dish.   No all fruit is created equal. Please see the list below, the fruit at the bottom is higher in sugars than the fruit at the top   Preventative Care for Adults - Female      MAINTAIN REGULAR HEALTH EXAMS:  A routine yearly physical is a good way to check in with your primary care provider about your health and preventive screening. It is also an opportunity to share updates about your health and any concerns you have, and receive a thorough all-over exam.   Most health insurance companies pay for at least some preventative services.  Check with your health plan for specific coverages.  WHAT PREVENTATIVE SERVICES DO WOMEN NEED?  Adult women should have their weight and blood pressure checked regularly.   Women age 67 and older should have their cholesterol levels checked regularly.  Women should be screened for cervical cancer with a Pap smear and pelvic exam beginning at either age 39, or 3 years after they become sexually activity.    Breast cancer screening generally begins at age 33 with a mammogram and breast exam by your primary care provider.    Beginning at age 76 and continuing to age 60, women should be screened for colorectal cancer.  Certain people may  need continued testing until age 81.  Updating vaccinations is part of preventative care.  Vaccinations help protect against diseases such as the flu.  Osteoporosis is a disease in which the bones lose minerals and strength as we age. Women ages 21 and over should discuss this with their caregivers, as should women after menopause who have other risk factors.  Lab tests are generally done as part of preventative care to screen for anemia and blood disorders, to screen for problems with the kidneys and liver, to screen for bladder problems, to check blood sugar, and to check your cholesterol level.  Preventative services generally include counseling about diet, exercise, avoiding tobacco, drugs, excessive alcohol consumption, and sexually transmitted infections.    GENERAL RECOMMENDATIONS FOR GOOD HEALTH:  Healthy diet:  Eat a variety of foods, including fruit, vegetables, animal or vegetable protein, such as meat, fish, chicken, and eggs, or beans, lentils, tofu, and grains, such as rice.  Drink plenty of water daily.  Decrease saturated fat in the diet, avoid lots of red meat, processed foods, sweets, fast foods, and fried foods.  Exercise:  Aerobic exercise helps maintain good heart health. At least 30-40 minutes of moderate-intensity exercise is recommended. For example, a brisk walk that increases your heart rate and breathing. This should be done on most days of the week.   Find a type of exercise or  a variety of exercises that you enjoy so that it becomes a part of your daily life.  Examples are running, walking, swimming, water aerobics, and biking.  For motivation and support, explore group exercise such as aerobic class, spin class, Zumba, Yoga,or  martial arts, etc.    Set exercise goals for yourself, such as a certain weight goal, walk or run in a race such as a 5k walk/run.  Speak to your primary care provider about exercise goals.  Disease prevention:  If you smoke or chew  tobacco, find out from your caregiver how to quit. It can literally save your life, no matter how long you have been a tobacco user. If you do not use tobacco, never begin.   Maintain a healthy diet and normal weight. Increased weight leads to problems with blood pressure and diabetes.   The Body Mass Index or BMI is a way of measuring how much of your body is fat. Having a BMI above 27 increases the risk of heart disease, diabetes, hypertension, stroke and other problems related to obesity. Your caregiver can help determine your BMI and based on it develop an exercise and dietary program to help you achieve or maintain this important measurement at a healthful level.  High blood pressure causes heart and blood vessel problems.  Persistent high blood pressure should be treated with medicine if weight loss and exercise do not work.   Fat and cholesterol leaves deposits in your arteries that can block them. This causes heart disease and vessel disease elsewhere in your body.  If your cholesterol is found to be high, or if you have heart disease or certain other medical conditions, then you may need to have your cholesterol monitored frequently and be treated with medication.   Ask if you should have a cardiac stress test if your history suggests this. A stress test is a test done on a treadmill that looks for heart disease. This test can find disease prior to there being a problem.  Menopause can be associated with physical symptoms and risks. Hormone replacement therapy is available to decrease these. You should talk to your caregiver about whether starting or continuing to take hormones is right for you.   Osteoporosis is a disease in which the bones lose minerals and strength as we age. This can result in serious bone fractures. Risk of osteoporosis can be identified using a bone density scan. Women ages 41 and over should discuss this with their caregivers, as should women after menopause who have  other risk factors. Ask your caregiver whether you should be taking a calcium supplement and Vitamin D, to reduce the rate of osteoporosis.   Avoid drinking alcohol in excess (more than two drinks per day).  Avoid use of street drugs. Do not share needles with anyone. Ask for professional help if you need assistance or instructions on stopping the use of alcohol, cigarettes, and/or drugs.  Brush your teeth twice a day with fluoride toothpaste, and floss once a day. Good oral hygiene prevents tooth decay and gum disease. The problems can be painful, unattractive, and can cause other health problems. Visit your dentist for a routine oral and dental check up and preventive care every 6-12 months.   Look at your skin regularly.  Use a mirror to look at your back. Notify your caregivers of changes in moles, especially if there are changes in shapes, colors, a size larger than a pencil eraser, an irregular border, or development of new moles.  Safety:  Use seatbelts 100% of the time, whether driving or as a passenger.  Use safety devices such as hearing protection if you work in environments with loud noise or significant background noise.  Use safety glasses when doing any work that could send debris in to the eyes.  Use a helmet if you ride a bike or motorcycle.  Use appropriate safety gear for contact sports.  Talk to your caregiver about gun safety.  Use sunscreen with a SPF (or skin protection factor) of 15 or greater.  Lighter skinned people are at a greater risk of skin cancer. Don't forget to also wear sunglasses in order to protect your eyes from too much damaging sunlight. Damaging sunlight can accelerate cataract formation.   Practice safe sex. Use condoms. Condoms are used for birth control and to help reduce the spread of sexually transmitted infections (or STIs).  Some of the STIs are gonorrhea (the clap), chlamydia, syphilis, trichomonas, herpes, HPV (human papilloma virus) and HIV (human  immunodeficiency virus) which causes AIDS. The herpes, HIV and HPV are viral illnesses that have no cure. These can result in disability, cancer and death.   Keep carbon monoxide and smoke detectors in your home functioning at all times. Change the batteries every 6 months or use a model that plugs into the wall.   Vaccinations:  Stay up to date with your tetanus shots and other required immunizations. You should have a booster for tetanus every 10 years. Be sure to get your flu shot every year, since 5%-20% of the [place] population comes down with the flu. The flu vaccine changes each year, so being vaccinated once is not enough. Get your shot in the fall, before the flu season peaks.   Other vaccines to consider:  Human Papilloma Virus or HPV causes cancer of the cervix, and other infections that can be transmitted from person to person. There is a vaccine for HPV, and females should get immunized between the ages of 11 and 75. It requires a series of 3 shots.   Pneumococcal vaccine to protect against certain types of pneumonia.  This is normally recommended for adults age 45 or older.  However, adults younger than 78 years old with certain underlying conditions such as diabetes, heart or lung disease should also receive the vaccine.  Shingles vaccine to protect against Varicella Zoster if you are older than age 94, or younger than 78 years old with certain underlying illness.  Hepatitis A vaccine to protect against a form of infection of the liver by a virus acquired from food.  Hepatitis B vaccine to protect against a form of infection of the liver by a virus acquired from blood or body fluids, particularly if you work in health care.  If you plan to travel internationally, check with your local health department for specific vaccination recommendations.  Cancer Screening:  Breast cancer screening is essential to preventive care for women. All women age 24 and older should perform a breast  self-exam every month. At age 70 and older, women should have their caregiver complete a breast exam each year. Women at ages 32 and older should have a mammogram (x-ray film) of the breasts. Your caregiver can discuss how often you need mammograms.    Cervical cancer screening includes taking a Pap smear (sample of cells examined under a microscope) from the cervix (end of the uterus). It also includes testing for HPV (Human Papilloma Virus, which can cause cervical cancer). Screening and a pelvic exam  should begin at age 78, or 3 years after a woman becomes sexually active. Screening should occur every year, with a Pap smear but no HPV testing, up to age 58. After age 74, you should have a Pap smear every 3 years with HPV testing, if no HPV was found previously.   Most routine colon cancer screening begins at the age of 10. On a yearly basis, doctors may provide special easy to use take-home tests to check for hidden blood in the stool. Sigmoidoscopy or colonoscopy can detect the earliest forms of colon cancer and is life saving. These tests use a small camera at the end of a tube to directly examine the colon. Speak to your caregiver about this at age 70, when routine screening begins (and is repeated every 5 years unless early forms of pre-cancerous polyps or small growths are found).

## 2014-01-22 LAB — LIPID PANEL
CHOL/HDL RATIO: 2.6 ratio
Cholesterol: 117 mg/dL (ref 0–200)
HDL: 45 mg/dL (ref >39)
LDL Cholesterol: 49 mg/dL (ref 0–99)
Triglycerides: 114 mg/dL (ref <150)
VLDL: 23 mg/dL (ref 0–40)

## 2014-01-22 LAB — BASIC METABOLIC PANEL WITH GFR
BUN: 22 mg/dL (ref 6–23)
CALCIUM: 9.6 mg/dL (ref 8.4–10.5)
CO2: 25 meq/L (ref 19–32)
CREATININE: 0.81 mg/dL (ref 0.50–1.10)
Chloride: 104 mEq/L (ref 96–112)
GFR, Est African American: 74 mL/min
GFR, Est Non African American: 65 mL/min
Glucose, Bld: 119 mg/dL — ABNORMAL HIGH (ref 70–99)
Potassium: 3.8 mEq/L (ref 3.5–5.3)
Sodium: 139 mEq/L (ref 135–145)

## 2014-01-22 LAB — INSULIN, FASTING: Insulin fasting, serum: 5 u[IU]/mL (ref 3–28)

## 2014-01-22 LAB — HEPATIC FUNCTION PANEL
ALBUMIN: 4 g/dL (ref 3.5–5.2)
ALT: 17 U/L (ref 0–35)
AST: 20 U/L (ref 0–37)
Alkaline Phosphatase: 69 U/L (ref 39–117)
BILIRUBIN TOTAL: 0.7 mg/dL (ref 0.2–1.2)
Bilirubin, Direct: 0.2 mg/dL (ref 0.0–0.3)
Indirect Bilirubin: 0.5 mg/dL (ref 0.2–1.2)
Total Protein: 6.4 g/dL (ref 6.0–8.3)

## 2014-01-22 LAB — TSH: TSH: 1.547 u[IU]/mL (ref 0.350–4.500)

## 2014-01-22 LAB — MAGNESIUM: MAGNESIUM: 2.1 mg/dL (ref 1.5–2.5)

## 2014-02-09 ENCOUNTER — Other Ambulatory Visit: Payer: Self-pay | Admitting: Internal Medicine

## 2014-03-09 ENCOUNTER — Ambulatory Visit (INDEPENDENT_AMBULATORY_CARE_PROVIDER_SITE_OTHER): Payer: Medicare Other | Admitting: Physician Assistant

## 2014-03-09 ENCOUNTER — Encounter: Payer: Self-pay | Admitting: Physician Assistant

## 2014-03-09 VITALS — BP 120/60 | HR 64 | Temp 98.4°F | Resp 16 | Ht 63.0 in | Wt 158.0 lb

## 2014-03-09 DIAGNOSIS — Z79899 Other long term (current) drug therapy: Secondary | ICD-10-CM

## 2014-03-09 DIAGNOSIS — I10 Essential (primary) hypertension: Secondary | ICD-10-CM

## 2014-03-09 LAB — CBC WITH DIFFERENTIAL/PLATELET
BASOS ABS: 0.1 10*3/uL (ref 0.0–0.1)
Basophils Relative: 1 % (ref 0–1)
Eosinophils Absolute: 0.1 10*3/uL (ref 0.0–0.7)
Eosinophils Relative: 2 % (ref 0–5)
HCT: 42.1 % (ref 36.0–46.0)
Hemoglobin: 14 g/dL (ref 12.0–15.0)
LYMPHS PCT: 25 % (ref 12–46)
Lymphs Abs: 1.5 10*3/uL (ref 0.7–4.0)
MCH: 30.9 pg (ref 26.0–34.0)
MCHC: 33.3 g/dL (ref 30.0–36.0)
MCV: 92.9 fL (ref 78.0–100.0)
Monocytes Absolute: 0.5 10*3/uL (ref 0.1–1.0)
Monocytes Relative: 9 % (ref 3–12)
NEUTROS ABS: 3.8 10*3/uL (ref 1.7–7.7)
Neutrophils Relative %: 63 % (ref 43–77)
PLATELETS: 243 10*3/uL (ref 150–400)
RBC: 4.53 MIL/uL (ref 3.87–5.11)
RDW: 13.9 % (ref 11.5–15.5)
WBC: 6 10*3/uL (ref 4.0–10.5)

## 2014-03-09 MED ORDER — BUMETANIDE 2 MG PO TABS: ORAL_TABLET | ORAL | Status: DC

## 2014-03-09 NOTE — Progress Notes (Signed)
   Subjective:    Patient ID: [name], female    DOB: 1922-05-02, 80 y.o.   MRN: [id number]  HPI 80 y.o. female with history of HTN presents with uncontrolled BP over the weekend. She had stopped taking her Bumex She states that she had elevated BP over the weekend with edema. She called the on call service and she took Bumex. She denies CP, SOB, dizziness, changes in vision, PND, othropnea.   Wt Readings from Last 3 Encounters:  03/09/14 158 lb (71.668 kg)  01/21/14 161 lb (73.029 kg)  10/13/13 161 lb 3.2 oz (73.12 kg)    Review of Systems  Constitutional: Negative.   HENT: Negative.   Respiratory: Negative.   Cardiovascular: Negative.   Gastrointestinal: Negative.   Genitourinary: Negative.   Neurological: Negative.        Objective:   Physical Exam  Constitutional: She is oriented to person, place, and time. She appears well-developed and well-nourished.  HENT:  Head: Normocephalic and atraumatic.  Right Ear: External ear normal.  Left Ear: External ear normal.  Eyes: Conjunctivae are normal. Pupils are equal, round, and reactive to light.  Neck: Normal range of motion. Neck supple.  Cardiovascular: Normal rate and regular rhythm.   Pulmonary/Chest: Effort normal and breath sounds normal.  Abdominal: Soft. Bowel sounds are normal.  Musculoskeletal: Normal range of motion. She exhibits no edema.  Neurological: She is alert and oriented to person, place, and time.  Skin: Skin is warm and dry.       Assessment & Plan:  HTN- no end organ damage evident. She will get back on Bumex 1/2 pill every other day and monitor her BP. We will get labs.

## 2014-03-09 NOTE — Patient Instructions (Signed)
Add the bumex back 1/2 pill every other day (Monday, Wednesday, and Friday. ) Monitor your BP  Hypertension As your heart beats, it forces blood through your arteries. This force is your blood pressure. If the pressure is too high, it is called hypertension (HTN) or high blood pressure. HTN is dangerous because you may have it and not know it. High blood pressure may mean that your heart has to work harder to pump blood. Your arteries may be narrow or stiff. The extra work puts you at risk for heart disease, stroke, and other problems.  Blood pressure consists of two numbers, a higher number over a lower, 110/72, for example. It is stated as "110 over 72." The ideal is below 120 for the top number (systolic) and under 80 for the bottom (diastolic). Write down your blood pressure today. You should pay close attention to your blood pressure if you have certain conditions such as:  Heart failure.  Prior heart attack.  Diabetes  Chronic kidney disease.  Prior stroke.  Multiple risk factors for heart disease. To see if you have HTN, your blood pressure should be measured while you are seated with your arm held at the level of the heart. It should be measured at least twice. A one-time elevated blood pressure reading (especially in the Emergency Department) does not mean that you need treatment. There may be conditions in which the blood pressure is different between your right and left arms. It is important to see your caregiver soon for a recheck. Most people have essential hypertension which means that there is not a specific cause. This type of high blood pressure may be lowered by changing lifestyle factors such as:  Stress.  Smoking.  Lack of exercise.  Excessive weight.  Drug/tobacco/alcohol use.  Eating less salt. Most people do not have symptoms from high blood pressure until it has caused damage to the body. Effective treatment can often prevent, delay or reduce that  damage. TREATMENT  When a cause has been identified, treatment for high blood pressure is directed at the cause. There are a large number of medications to treat HTN. These fall into several categories, and your caregiver will help you select the medicines that are best for you. Medications may have side effects. You should review side effects with your caregiver. If your blood pressure stays high after you have made lifestyle changes or started on medicines,   Your medication(s) may need to be changed.  Other problems may need to be addressed.  Be certain you understand your prescriptions, and know how and when to take your medicine.  Be sure to follow up with your caregiver within the time frame advised (usually within two weeks) to have your blood pressure rechecked and to review your medications.  If you are taking more than one medicine to lower your blood pressure, make sure you know how and at what times they should be taken. Taking two medicines at the same time can result in blood pressure that is too low. SEEK IMMEDIATE MEDICAL CARE IF:  You develop a severe headache, blurred or changing vision, or confusion.  You have unusual weakness or numbness, or a faint feeling.  You have severe chest or abdominal pain, vomiting, or breathing problems. MAKE SURE YOU:   Understand these instructions.  Will watch your condition.  Will get help right away if you are not doing well or get worse. Document Released: 11/13/2005 Document Revised: 02/05/2012 Document Reviewed: 07/03/2008 [employer] Patient Information 2014  [employer]

## 2014-03-10 LAB — HEPATIC FUNCTION PANEL
ALBUMIN: 3.8 g/dL (ref 3.5–5.2)
ALT: 20 U/L (ref 0–35)
AST: 20 U/L (ref 0–37)
Alkaline Phosphatase: 63 U/L (ref 39–117)
BILIRUBIN INDIRECT: 0.4 mg/dL (ref 0.2–1.2)
BILIRUBIN TOTAL: 0.5 mg/dL (ref 0.2–1.2)
Bilirubin, Direct: 0.1 mg/dL (ref 0.0–0.3)
Total Protein: 6 g/dL (ref 6.0–8.3)

## 2014-03-10 LAB — BASIC METABOLIC PANEL WITH GFR
BUN: 22 mg/dL (ref 6–23)
CHLORIDE: 105 meq/L (ref 96–112)
CO2: 27 mEq/L (ref 19–32)
Calcium: 9 mg/dL (ref 8.4–10.5)
Creat: 0.79 mg/dL (ref 0.50–1.10)
GFR, EST NON AFRICAN AMERICAN: 67 mL/min
GFR, Est African American: 77 mL/min
Glucose, Bld: 132 mg/dL — ABNORMAL HIGH (ref 70–99)
POTASSIUM: 3.6 meq/L (ref 3.5–5.3)
Sodium: 143 mEq/L (ref 135–145)

## 2014-03-10 LAB — MAGNESIUM: Magnesium: 1.9 mg/dL (ref 1.5–2.5)

## 2014-03-24 ENCOUNTER — Other Ambulatory Visit: Payer: Self-pay | Admitting: Emergency Medicine

## 2014-04-22 ENCOUNTER — Ambulatory Visit (INDEPENDENT_AMBULATORY_CARE_PROVIDER_SITE_OTHER): Payer: Medicare Other | Admitting: Internal Medicine

## 2014-04-22 ENCOUNTER — Ambulatory Visit: Payer: Self-pay | Admitting: Internal Medicine

## 2014-04-22 ENCOUNTER — Encounter: Payer: Self-pay | Admitting: Internal Medicine

## 2014-04-22 VITALS — BP 126/80 | HR 64 | Temp 97.9°F | Resp 16 | Ht 63.75 in | Wt 160.4 lb

## 2014-04-22 DIAGNOSIS — I10 Essential (primary) hypertension: Secondary | ICD-10-CM

## 2014-04-22 DIAGNOSIS — E559 Vitamin D deficiency, unspecified: Secondary | ICD-10-CM

## 2014-04-22 DIAGNOSIS — R7309 Other abnormal glucose: Secondary | ICD-10-CM

## 2014-04-22 DIAGNOSIS — Z79899 Other long term (current) drug therapy: Secondary | ICD-10-CM

## 2014-04-22 DIAGNOSIS — E785 Hyperlipidemia, unspecified: Secondary | ICD-10-CM

## 2014-04-22 LAB — CBC WITH DIFFERENTIAL/PLATELET
Basophils Absolute: 0.1 10*3/uL (ref 0.0–0.1)
Basophils Relative: 1 % (ref 0–1)
EOS ABS: 0.2 10*3/uL (ref 0.0–0.7)
Eosinophils Relative: 4 % (ref 0–5)
HCT: 39.9 % (ref 36.0–46.0)
HEMOGLOBIN: 13.5 g/dL (ref 12.0–15.0)
LYMPHS ABS: 1.5 10*3/uL (ref 0.7–4.0)
Lymphocytes Relative: 26 % (ref 12–46)
MCH: 31.8 pg (ref 26.0–34.0)
MCHC: 33.8 g/dL (ref 30.0–36.0)
MCV: 93.9 fL (ref 78.0–100.0)
MONOS PCT: 11 % (ref 3–12)
Monocytes Absolute: 0.6 10*3/uL (ref 0.1–1.0)
NEUTROS PCT: 58 % (ref 43–77)
Neutro Abs: 3.4 10*3/uL (ref 1.7–7.7)
Platelets: 236 10*3/uL (ref 150–400)
RBC: 4.25 MIL/uL (ref 3.87–5.11)
RDW: 13.9 % (ref 11.5–15.5)
WBC: 5.8 10*3/uL (ref 4.0–10.5)

## 2014-04-22 NOTE — Progress Notes (Signed)
Patient ID: [name], female   DOB: 1922-02-27, 80 y.o.   MRN: [id number]    This very nice 80 y.o.WWF presents for 3 month follow up with Hypertension, ASCAD/PTCAHyperlipidemia, Pre-Diabetes and Vitamin D Deficiency.    HTN predates since 1989. BP has been labile at home. Today's BP: 126/80 mmHg. Patient has PTCA in 1994 and has done well since. Patient denies any cardiac type chest pain, palpitations, dyspnea/orthopnea/PND, dizziness, claudication, or dependent edema.   Hyperlipidemia is controlled with diet & meds. Last lipids in Feb 2015 as below were at goal. Patient denies myalgias or other med SE's.  Lab Results  Component Value Date   CHOL 117 01/21/2014   HDL 45 01/21/2014   LDLCALC 49 01/21/2014   TRIG 114 01/21/2014   CHOLHDL 2.6 01/21/2014    Also, the patient has history of PreDiabetes with A1c 6.0% in Oct 2012 and last A1c was 5.6% in Feb 2015. Patient denies any symptoms of reactive hypoglycemia, diabetic polys, paresthesias or visual blurring.   Further, Patient has history of Vitamin D Deficiency of 54 on supplements in 2008 and last vitamin D was 82 in Feb 2015. Patient supplements vitamin D without any suspected side-effects.   Medication List     ACIDOPHILUS PO  Take 1 capsule by mouth daily.     ALPHA LIPOIC ACID PO  Take 200 mg by mouth daily.     amoxicillin 500 MG capsule  Commonly known as:  AMOXIL  TAKE 4 CAPSULES BY MOUTH 1-2 HOURS BEFORE DENTAL WORK     aspirin EC 81 MG tablet  Take 81 mg by mouth daily.     atorvastatin 40 MG tablet  Commonly known as:  LIPITOR  Take 10 mg by mouth every other day.     B-complex with vitamin C tablet  Take 1 tablet by mouth daily.     bumetanide 2 MG tablet  Commonly known as:  BUMEX  Take 1/2-1 pill daily or as directed by your doctor     cyclobenzaprine 10 MG tablet  Commonly known as:  FLEXERIL  Take 10 mg by mouth 3 (three) times daily as needed for muscle spasms.     diltiazem 120 MG 12 hr capsule   Commonly known as:  CARDIZEM SR  Take 1 capsule (120 mg total) by mouth 2 (two) times daily.     dorzolamide 2 % ophthalmic solution  Commonly known as:  TRUSOPT  1 drop daily.     isosorbide mononitrate 30 MG 24 hr tablet  Commonly known as:  IMDUR  Take 1 tablet by mouth  every night at bedtime     KELP PO  Take 1 tablet by mouth daily.     latanoprost 0.005 % ophthalmic solution  Commonly known as:  XALATAN  Place 1 drop into both eyes at bedtime.     losartan 100 MG tablet  Commonly known as:  COZAAR  Take 50 mg by mouth 2 (two) times daily.     meloxicam 15 MG tablet  Commonly known as:  MOBIC  Take one pill daily with food as needed for pain.     metoprolol 200 MG 24 hr tablet  Commonly known as:  TOPROL-XL  Take 1 tablet by mouth  every day  Rx written by: Kelby Aline     OVER THE COUNTER MEDICATION  Take 75 mg by mouth daily. Zinc 75mg      OVER THE COUNTER MEDICATION  Take 1 capsule by mouth  every evening. Herbavision. Hold while in hospital     traMADol 50 MG tablet  Commonly known as:  ULTRAM  Take 50 mg by mouth every 6 (six) hours as needed (usually takes 1 a day).     Vitamin D 2000 UNITS tablet  Take 8,000 Units by mouth daily. Take 1000 units along with 8000 units to make a total of 9000 units each day     cholecalciferol 1000 UNITS tablet  Commonly known as:  VITAMIN D  Take 1,000 Units by mouth daily. Patient take 1000 units of vitamin d along with 8000 units to make a total of 9000 units daily       Allergies  Allergen Reactions  . Lactose Intolerance (Gi)    PMHx:   Past Medical History  Diagnosis Date  . Meningitis   . Macular degeneration   . Hypertension   . GERD (gastroesophageal reflux disease)   . Hyperlipidemia   . Elevated hemoglobin A1c   . Glaucoma   . Cataract   . Cancer 1979 mastectomy   FHx:    Reviewed / unchanged  SHx:    Reviewed / unchanged   Systems Review: Constitutional: Denies fever, chills, wt  changes, headaches, insomnia, fatigue, night sweats, change in appetite. Eyes: Denies redness, blurred vision, diplopia, discharge, itchy, watery eyes.  ENT: Denies discharge, congestion, post nasal drip, epistaxis, sore throat, earache, hearing loss, dental pain, tinnitus, vertigo, sinus pain, snoring.  CV: Denies chest pain, palpitations, irregular heartbeat, syncope, dyspnea, diaphoresis, orthopnea, PND, claudication or edema. Respiratory: denies cough, dyspnea, DOE, pleurisy, hoarseness, laryngitis, wheezing.  Gastrointestinal: Denies dysphagia, odynophagia, heartburn, reflux, water brash, abdominal pain or cramps, nausea, vomiting, bloating, diarrhea, constipation, hematemesis, melena, hematochezia  or hemorrhoids. Genitourinary: Denies dysuria, frequency, urgency, nocturia, hesitancy, discharge, hematuria or flank pain. Musculoskeletal: Denies arthralgias, myalgias, stiffness, jt. swelling, pain, limping or strain/sprain.  Skin: Denies pruritus, rash, hives, warts, acne, eczema or change in skin lesion(s). Neuro: No weakness, tremor, incoordination, spasms, paresthesia or pain. Psychiatric: Denies confusion, memory loss or sensory loss. Endo: Denies change in weight, skin or hair change.  Heme/Lymph: No excessive bleeding, bruising or enlarged lymph nodes.  Exam:  BP 126/80  P 64  T 97.9 F   R 16  Ht 5' 3.75"   Wt 160 lb 6.4 oz   BMI 27.76 kg/m2  Appears well nourished - in no distress. Eyes: PERRLA, EOMs, conjunctiva no swelling or erythema. Sinuses: No frontal/maxillary tenderness ENT/Mouth: EAC's clear, TM's nl w/o erythema, bulging. Nares clear w/o erythema, swelling, exudates. Oropharynx clear without erythema or exudates. Oral hygiene is good. Tongue normal, non obstructing. Hearing intact.  Neck: Supple. Thyroid nl. Car 2+/2+ without bruits, nodes or JVD. Chest: Respirations nl with BS clear & equal w/o rales, rhonchi, wheezing or stridor.  Cor: Heart sounds normal w/  regular rate and rhythm without sig. murmurs, gallops, clicks, or rubs. Peripheral pulses normal and equal  without edema.  Abdomen: Soft & bowel sounds normal. Non-tender w/o guarding, rebound, hernias, masses, or organomegaly.  Lymphatics: Unremarkable.  Musculoskeletal: Full ROM all peripheral extremities, joint stability, 5/5 strength, and normal gait.  Skin: Warm, dry without exposed rashes, lesions or ecchymosis apparent.  Neuro: Cranial nerves intact, reflexes equal bilaterally. Sensory-motor testing grossly intact. Tendon reflexes grossly intact.  Pysch: Alert & oriented x 3. Insight and judgement nl & appropriate. No ideations.  Assessment and Plan:  1. Hypertension - Continue monitor blood pressure at home. Continue diet/meds same.  2. Hyperlipidemia - Continue  diet/meds, exercise,& lifestyle modifications. Continue monitor periodic cholesterol/liver & renal functions   3. Pre-diabetes/Insulin Resistance - Continue diet, exercise, lifestyle modifications. Monitor appropriate labs.  4. Vitamin D Deficiency - Continue supplementation.  5. ASCAD / PTCA (1994)   Recommended regular exercise, BP monitoring, weight control, and discussed med and SE's. Recommended labs to assess and monitor clinical status. Further disposition pending results of labs.

## 2014-04-22 NOTE — Patient Instructions (Signed)

## 2014-04-23 LAB — BASIC METABOLIC PANEL WITH GFR
BUN: 22 mg/dL (ref 6–23)
CALCIUM: 9.2 mg/dL (ref 8.4–10.5)
CHLORIDE: 104 meq/L (ref 96–112)
CO2: 27 meq/L (ref 19–32)
Creat: 0.95 mg/dL (ref 0.50–1.10)
GFR, Est African American: 61 mL/min
GFR, Est Non African American: 53 mL/min — ABNORMAL LOW
Glucose, Bld: 97 mg/dL (ref 70–99)
Potassium: 4.2 mEq/L (ref 3.5–5.3)
SODIUM: 138 meq/L (ref 135–145)

## 2014-04-23 LAB — LIPID PANEL
Cholesterol: 138 mg/dL (ref 0–200)
HDL: 41 mg/dL (ref >39)
LDL CALC: 74 mg/dL (ref 0–99)
Total CHOL/HDL Ratio: 3.4 Ratio
Triglycerides: 115 mg/dL (ref <150)
VLDL: 23 mg/dL (ref 0–40)

## 2014-04-23 LAB — HEPATIC FUNCTION PANEL
ALK PHOS: 67 U/L (ref 39–117)
ALT: 21 U/L (ref 0–35)
AST: 22 U/L (ref 0–37)
Albumin: 3.7 g/dL (ref 3.5–5.2)
BILIRUBIN DIRECT: 0.1 mg/dL (ref 0.0–0.3)
Indirect Bilirubin: 0.4 mg/dL (ref 0.2–1.2)
Total Bilirubin: 0.5 mg/dL (ref 0.2–1.2)
Total Protein: 6.1 g/dL (ref 6.0–8.3)

## 2014-04-23 LAB — HEMOGLOBIN A1C
HEMOGLOBIN A1C: 6 % — AB (ref <5.7)
MEAN PLASMA GLUCOSE: 126 mg/dL — AB (ref <117)

## 2014-04-23 LAB — VITAMIN D 25 HYDROXY (VIT D DEFICIENCY, FRACTURES): Vit D, 25-Hydroxy: 81 ng/mL (ref 30–89)

## 2014-04-23 LAB — MAGNESIUM: Magnesium: 2.2 mg/dL (ref 1.5–2.5)

## 2014-04-23 LAB — INSULIN, FASTING: Insulin fasting, serum: 9 u[IU]/mL (ref 3–28)

## 2014-04-23 LAB — TSH: TSH: 2.149 u[IU]/mL (ref 0.350–4.500)

## 2014-04-25 ENCOUNTER — Other Ambulatory Visit: Payer: Self-pay | Admitting: Physician Assistant

## 2014-04-25 ENCOUNTER — Other Ambulatory Visit: Payer: Self-pay | Admitting: Internal Medicine

## 2014-05-12 ENCOUNTER — Other Ambulatory Visit: Payer: Self-pay | Admitting: Internal Medicine

## 2014-05-12 DIAGNOSIS — Z1231 Encounter for screening mammogram for malignant neoplasm of breast: Secondary | ICD-10-CM

## 2014-06-04 ENCOUNTER — Other Ambulatory Visit: Payer: Self-pay | Admitting: Dermatology

## 2014-06-05 ENCOUNTER — Other Ambulatory Visit: Payer: Self-pay | Admitting: Internal Medicine

## 2014-06-24 ENCOUNTER — Other Ambulatory Visit: Payer: Self-pay | Admitting: Physician Assistant

## 2014-07-10 ENCOUNTER — Other Ambulatory Visit: Payer: Self-pay | Admitting: Dermatology

## 2014-07-22 ENCOUNTER — Other Ambulatory Visit: Payer: Self-pay | Admitting: Internal Medicine

## 2014-07-29 ENCOUNTER — Ambulatory Visit: Payer: Self-pay | Admitting: Physician Assistant

## 2014-07-29 ENCOUNTER — Ambulatory Visit: Payer: Self-pay | Admitting: Emergency Medicine

## 2014-08-04 ENCOUNTER — Other Ambulatory Visit: Payer: Self-pay | Admitting: Physician Assistant

## 2014-08-17 ENCOUNTER — Ambulatory Visit (INDEPENDENT_AMBULATORY_CARE_PROVIDER_SITE_OTHER): Payer: Medicare Other | Admitting: Physician Assistant

## 2014-08-17 ENCOUNTER — Encounter: Payer: Self-pay | Admitting: Physician Assistant

## 2014-08-17 VITALS — BP 138/78 | HR 68 | Temp 98.1°F | Resp 16 | Ht 63.0 in | Wt 162.0 lb

## 2014-08-17 DIAGNOSIS — I10 Essential (primary) hypertension: Secondary | ICD-10-CM

## 2014-08-17 DIAGNOSIS — Z23 Encounter for immunization: Secondary | ICD-10-CM

## 2014-08-17 DIAGNOSIS — H409 Unspecified glaucoma: Secondary | ICD-10-CM

## 2014-08-17 DIAGNOSIS — Z79899 Other long term (current) drug therapy: Secondary | ICD-10-CM

## 2014-08-17 DIAGNOSIS — R7309 Other abnormal glucose: Secondary | ICD-10-CM

## 2014-08-17 DIAGNOSIS — E785 Hyperlipidemia, unspecified: Secondary | ICD-10-CM

## 2014-08-17 DIAGNOSIS — E559 Vitamin D deficiency, unspecified: Secondary | ICD-10-CM

## 2014-08-17 LAB — HEMOGLOBIN A1C
Hgb A1c MFr Bld: 6.1 % — ABNORMAL HIGH (ref <5.7)
Mean Plasma Glucose: 128 mg/dL — ABNORMAL HIGH (ref <117)

## 2014-08-17 LAB — CBC WITH DIFFERENTIAL/PLATELET
BASOS ABS: 0 10*3/uL (ref 0.0–0.1)
Basophils Relative: 0 % (ref 0–1)
EOS ABS: 0.1 10*3/uL (ref 0.0–0.7)
Eosinophils Relative: 2 % (ref 0–5)
HCT: 41.7 % (ref 36.0–46.0)
Hemoglobin: 13.9 g/dL (ref 12.0–15.0)
Lymphocytes Relative: 25 % (ref 12–46)
Lymphs Abs: 1.5 10*3/uL (ref 0.7–4.0)
MCH: 31 pg (ref 26.0–34.0)
MCHC: 33.3 g/dL (ref 30.0–36.0)
MCV: 93.1 fL (ref 78.0–100.0)
MONO ABS: 0.6 10*3/uL (ref 0.1–1.0)
Monocytes Relative: 10 % (ref 3–12)
Neutro Abs: 3.7 10*3/uL (ref 1.7–7.7)
Neutrophils Relative %: 63 % (ref 43–77)
PLATELETS: 232 10*3/uL (ref 150–400)
RBC: 4.48 MIL/uL (ref 3.87–5.11)
RDW: 13.5 % (ref 11.5–15.5)
WBC: 5.9 10*3/uL (ref 4.0–10.5)

## 2014-08-17 LAB — LIPID PANEL
Cholesterol: 118 mg/dL (ref 0–200)
HDL: 43 mg/dL (ref >39)
LDL CALC: 44 mg/dL (ref 0–99)
Total CHOL/HDL Ratio: 2.7 Ratio
Triglycerides: 154 mg/dL — ABNORMAL HIGH (ref <150)
VLDL: 31 mg/dL (ref 0–40)

## 2014-08-17 LAB — TSH: TSH: 1.723 u[IU]/mL (ref 0.350–4.500)

## 2014-08-17 LAB — BASIC METABOLIC PANEL WITH GFR
BUN: 25 mg/dL — AB (ref 6–23)
CHLORIDE: 108 meq/L (ref 96–112)
CO2: 26 mEq/L (ref 19–32)
Calcium: 9.5 mg/dL (ref 8.4–10.5)
Creat: 0.86 mg/dL (ref 0.50–1.10)
GFR, EST AFRICAN AMERICAN: 69 mL/min
GFR, Est Non African American: 60 mL/min
Glucose, Bld: 116 mg/dL — ABNORMAL HIGH (ref 70–99)
Potassium: 4.3 mEq/L (ref 3.5–5.3)
Sodium: 143 mEq/L (ref 135–145)

## 2014-08-17 LAB — HEPATIC FUNCTION PANEL
ALT: 21 U/L (ref 0–35)
AST: 21 U/L (ref 0–37)
Albumin: 3.9 g/dL (ref 3.5–5.2)
Alkaline Phosphatase: 70 U/L (ref 39–117)
BILIRUBIN DIRECT: 0.1 mg/dL (ref 0.0–0.3)
BILIRUBIN INDIRECT: 0.6 mg/dL (ref 0.2–1.2)
Total Bilirubin: 0.7 mg/dL (ref 0.2–1.2)
Total Protein: 6.2 g/dL (ref 6.0–8.3)

## 2014-08-17 LAB — MAGNESIUM: Magnesium: 2.2 mg/dL (ref 1.5–2.5)

## 2014-08-17 NOTE — Patient Instructions (Signed)

## 2014-08-17 NOTE — Progress Notes (Signed)
Assessment and Plan:  Hypertension: Continue medication, monitor blood pressure at home. Continue DASH diet. Cholesterol: Continue diet and exercise. Check cholesterol.  Pre-diabetes-Continue diet and exercise. Check A1C Vitamin D Def- check level and continue medications.   Continue diet and meds as discussed. Further disposition pending results of labs.  HPI 80 y.o. female  presents for 3 month follow up with hypertension, hyperlipidemia, prediabetes and vitamin D. Her blood pressure has been controlled at home, today their BP is BP: 138/78 mmHg She does workout, she is walking and works in her garden. She denies chest pain, shortness of breath, dizziness.  She did fall recently, she was out in her garden pulling weeds and fell backwards, she states she strained her left quad and has been using muscle relaxer and Bengay, it is doing better.  She is on cholesterol medication and denies myalgias. Her cholesterol is at goal. The cholesterol last visit was:   Lab Results  Component Value Date   CHOL 138 04/22/2014   HDL 41 04/22/2014   LDLCALC 74 04/22/2014   TRIG 115 04/22/2014   CHOLHDL 3.4 04/22/2014   She has been working on diet and exercise for prediabetes, and denies paresthesia of the feet, polydipsia and polyuria. Last A1C in the office was:  Lab Results  Component Value Date   HGBA1C 6.0* 04/22/2014   Patient is on Vitamin D supplement.   Lab Results  Component Value Date   VD25OH 43 04/22/2014       Current Medications:  Current Outpatient Prescriptions on File Prior to Visit  Medication Sig Dispense Refill  . ALPHA LIPOIC ACID PO Take 200 mg by mouth daily.        Marland Kitchen amoxicillin (AMOXIL) 500 MG capsule TAKE 4 CAPSULES BY MOUTH 1-2 HOURS BEFORE DENTAL WORK  28 capsule  0  . aspirin EC 81 MG tablet Take 81 mg by mouth daily.        Marland Kitchen atorvastatin (LIPITOR) 40 MG tablet Take 10 mg by mouth every other day.        . B Complex-C (B-COMPLEX WITH VITAMIN C) tablet Take 1 tablet by  mouth daily.        . bumetanide (BUMEX) 2 MG tablet Take 1 tablet daily for  blood pressure and fluid  90 tablet  1  . cholecalciferol (VITAMIN D) 1000 UNITS tablet Take 1,000 Units by mouth daily. Patient take 1000 units of vitamin d along with 8000 units to make a total of 9000 units daily       . Cholecalciferol (VITAMIN D) 2000 UNITS tablet Take 8,000 Units by mouth daily. Take 1000 units along with 8000 units to make a total of 9000 units each day       . cyclobenzaprine (FLEXERIL) 10 MG tablet TAKE 1/2 TO 1 TABLET BY MOUTH EVERY NIGHT AT BEDTIME AS NEEDED FOR PAIN  30 tablet  0  . diltiazem (CARDIZEM SR) 120 MG 12 hr capsule Take 1 capsule (120 mg total) by mouth 2 (two) times daily.  180 capsule  2  . diltiazem (CARDIZEM) 120 MG tablet Take 1 tablet by mouth  twice a day  180 tablet  2  . dorzolamide (TRUSOPT) 2 % ophthalmic solution 1 drop daily.      . dorzolamide-timolol (COSOPT) 22.3-6.8 MG/ML ophthalmic solution       . Iodine, Kelp, (KELP PO) Take 1 tablet by mouth daily.        . isosorbide mononitrate (IMDUR) 30 MG 24 hr  tablet Take 1 tablet by mouth  every night at bedtime  90 tablet  1  . Lactobacillus (ACIDOPHILUS PO) Take 1 capsule by mouth daily.        Marland Kitchen latanoprost (XALATAN) 0.005 % ophthalmic solution Place 1 drop into both eyes at bedtime.        Marland Kitchen losartan (COZAAR) 100 MG tablet Take one-half tablet by   mouth twice a day for blood pressure  90 tablet  2  . meloxicam (MOBIC) 15 MG tablet Take 1 tablet by mouth  daily with food as needed  for pain  90 tablet  2  . metoprolol (TOPROL-XL) 200 MG 24 hr tablet Take 1 tablet by mouth  every day  90 tablet  2  . OVER THE COUNTER MEDICATION Take 75 mg by mouth daily. Zinc 75mg        . OVER THE COUNTER MEDICATION Take 1 capsule by mouth every evening. Herbavision. Hold while in hospital       . traMADol (ULTRAM) 50 MG tablet Take 50 mg by mouth every 6 (six) hours as needed (usually takes 1 a day).       No current  facility-administered medications on file prior to visit.   Medical History:  Past Medical History  Diagnosis Date  . Meningitis   . Macular degeneration   . Hypertension   . GERD (gastroesophageal reflux disease)   . Hyperlipidemia   . Elevated hemoglobin A1c   . Glaucoma   . Cataract   . Cancer 1979 mastectomy   Allergies:  Allergies  Allergen Reactions  . Lactose Intolerance (Gi)      Review of Systems: [X]  = complains of  [ ]  = denies  General: Fatigue [ ]  Fever [ ]  Chills [ ]  Weakness [ ]   Insomnia [ ]  Eyes: Redness [ ]  Blurred vision [ ]  Diplopia [ ]   ENT: Congestion [ ]  Sinus Pain [ ]  Post Nasal Drip [ ]  Sore Throat [ ]  Earache [ ]   Cardiac: Chest pain/pressure [ ]  SOB [ ]  Orthopnea [ ]   Palpitations [ ]   Paroxysmal nocturnal dyspnea[ ]  Claudication [ ]  Edema [ ]   Pulmonary: Cough [ ]  Wheezing[ ]   SOB [ ]   Snoring [ ]   GI: Nausea [ ]  Vomiting[ ]  Dysphagia[ ]  Heartburn[ ]  Abdominal pain [ ]  Constipation [ ] ; Diarrhea [ ] ; BRBPR [ ]  Melena[ ]  GU: Hematuria[ ]  Dysuria [ ]  Nocturia[ ]  Urgency [ ]   Hesitancy [ ]  Discharge [ ]  Neuro: Headaches[ ]  Vertigo[ ]  Paresthesias[ ]  Spasm [ ]  Speech changes [ ]  Incoordination [ ]   Ortho: Arthritis [ ]  Joint pain [ ]  Muscle pain [ ]  Joint swelling [ ]  Back Pain [ ]  Skin:  Rash [ ]   Pruritis [ ]  Change in skin lesion [ ]   Psych: Depression[ ]  Anxiety[ ]  Confusion [ ]  Memory loss [ ]   Heme/Lypmh: Bleeding [ ]  Bruising [ ]  Enlarged lymph nodes [ ]   Endocrine: Visual blurring [ ]  Paresthesia [ ]  Polyuria [ ]  Polydypsea [ ]    Heat/cold intolerance [ ]  Hypoglycemia [ ]   Family history- Review and unchanged Social history- Review and unchanged Physical Exam: BP 138/78  Pulse 68  Temp(Src) 98.1 F (36.7 C)  Resp 16  Ht 5\' 3"  (1.6 m)  Wt 162 lb (73.483 kg)  BMI 28.70 kg/m2 Wt Readings from Last 3 Encounters:  08/17/14 162 lb (73.483 kg)  04/22/14 160 lb 6.4 oz (72.757 kg)  03/09/14 158 lb (71.668  kg)   General Appearance: Well  nourished, in no apparent distress. Eyes: PERRLA, EOMs, conjunctiva no swelling or erythema Sinuses: No Frontal/maxillary tenderness ENT/Mouth: Ext aud canals clear, TMs without erythema, bulging. No erythema, swelling, or exudate on post pharynx.  Tonsils not swollen or erythematous. Hearing decreased  Neck: Supple, thyroid normal.  Respiratory: Respiratory effort normal, BS equal bilaterally without rales, rhonchi, wheezing or stridor.  Cardio: RRR with no MRGs. Brisk peripheral pulses without edema.  Abdomen: Soft, + BS.  Non tender, no guarding, rebound, hernias, masses. Lymphatics: Non tender without lymphadenopathy.  Musculoskeletal: Full ROM, 5/5 strength, normal gait.  Skin: Warm, dry without rashes, lesions, ecchymosis.  Neuro: Cranial nerves intact. Normal muscle tone, no cerebellar symptoms. Sensation intact.  Psych: Awake and oriented X 3, normal affect, Insight and Judgment appropriate.    Vicie Mutters 9:59 AM

## 2014-10-08 ENCOUNTER — Other Ambulatory Visit: Payer: Self-pay | Admitting: Physician Assistant

## 2014-10-12 ENCOUNTER — Ambulatory Visit (HOSPITAL_COMMUNITY)
Admission: RE | Admit: 2014-10-12 | Discharge: 2014-10-12 | Disposition: A | Discharge status: Home / Self Care | Payer: Medicare Other | Source: Ambulatory Visit | Attending: Internal Medicine | Admitting: Internal Medicine

## 2014-10-12 DIAGNOSIS — Z1231 Encounter for screening mammogram for malignant neoplasm of breast: Secondary | ICD-10-CM | POA: Diagnosis present

## 2014-10-15 ENCOUNTER — Encounter: Payer: Self-pay | Admitting: Internal Medicine

## 2014-10-29 ENCOUNTER — Ambulatory Visit (INDEPENDENT_AMBULATORY_CARE_PROVIDER_SITE_OTHER): Payer: Medicare Other | Admitting: Physician Assistant

## 2014-10-29 ENCOUNTER — Encounter: Payer: Self-pay | Admitting: Physician Assistant

## 2014-10-29 VITALS — BP 154/82 | HR 62 | Temp 98.4°F | Resp 18 | Ht 63.0 in | Wt 158.0 lb

## 2014-10-29 DIAGNOSIS — M7918 Myalgia, other site: Secondary | ICD-10-CM

## 2014-10-29 DIAGNOSIS — M791 Myalgia: Secondary | ICD-10-CM

## 2014-10-29 NOTE — Progress Notes (Signed)
Subjective:    Patient ID: [name], female    DOB: 11-19-1922, 80 y.o.   MRN: [id number]  Fall Incident onset: 2 weeks ago. Fall occurred: Patient states she was bending over to pick up paper from floor in her den  and states she was staggering a bit and the next thing she knew she was on the floor.  She does not know how the fall happened. She fell from a height of 1 to 2 ft (Feel while bending over to pick up paper off floor). She landed on carpet. There was no blood loss. The point of impact was the head (Left arm and left thigh). Pain location: No pain, but muscle ache only in left thigh. Pain scale: More discomfort than pain. The patient is experiencing no pain. The symptoms are aggravated by standing. Pertinent negatives include no numbness or tingling. Associated symptoms comments: Denies LOC and states she had some left arm bruising that cleared up.  States she was walking with a walker when it first occurred but did not have any pain.  She states she was just using walker to help her get around.  She was not using a walker today at the office.. Treatments tried: Bengay cream. The treatment provided mild relief.  Leg Pain  The injury mechanism was a fall. The pain is present in the left thigh. The quality of the pain is described as aching. The patient is experiencing no pain. Pertinent negatives include no inability to bear weight, loss of motion, loss of sensation, muscle weakness, numbness or tingling. Exacerbated by: muscle ache only with standing. Treatments tried: Bengay cream. The treatment provided mild relief.  Patient states she had fall 6 months ago and fell in flower garden while trying to grab root part of "box wood plant".  She states she only "strained some ligaments in her legs".  States she was given muscle relaxers and did not like how they made her tired and sleepy and out of it.  Patient states she lives by herself and is able to perform ADL's.  Patient states she has  neighbor that cooks for her occasionally and checks in on her. GFR= 60 on 08/17/14 Review of Systems  Constitutional: Negative.   HENT: Negative.   Eyes: Negative.   Respiratory: Negative.   Cardiovascular: Negative.   Gastrointestinal: Negative.   Genitourinary: Negative.   Musculoskeletal:       Muscle aches of left thigh  Skin: Negative.  Negative for rash.  Neurological: Negative.  Negative for tingling and numbness.  Psychiatric/Behavioral: Negative.    Past Medical History  Diagnosis Date  . Meningitis   . Macular degeneration   . Hypertension   . GERD (gastroesophageal reflux disease)   . Hyperlipidemia   . Elevated hemoglobin A1c   . Cataract   . Cancer 1979 mastectomy  . Glaucoma     Right pupil (about 92mm) is larger than left pupil (about 26mm) due to surgery in 1973.  Amblyopia in right eye.   Current Outpatient Prescriptions on File Prior to Visit  Medication Sig Dispense Refill  . ALPHA LIPOIC ACID PO Take 200 mg by mouth daily.      Marland Kitchen amoxicillin (AMOXIL) 500 MG capsule TAKE 4 CAPSULES BY MOUTH 1-2 HOURS BEFORE DENTAL WORK 28 capsule 0  . aspirin EC 81 MG tablet Take 81 mg by mouth daily.      Marland Kitchen atorvastatin (LIPITOR) 40 MG tablet Take 10 mg by mouth every other day.      Marland Kitchen  B Complex-C (B-COMPLEX WITH VITAMIN C) tablet Take 1 tablet by mouth daily.      . bumetanide (BUMEX) 2 MG tablet Take 1 tablet by mouth  daily for blood pressure  and fluid 90 tablet 1  . cholecalciferol (VITAMIN D) 1000 UNITS tablet Take 1,000 Units by mouth daily. Patient take 1000 units of vitamin d along with 8000 units to make a total of 9000 units daily     . cyclobenzaprine (FLEXERIL) 10 MG tablet TAKE 1/2 TO 1 TABLET BY MOUTH EVERY NIGHT AT BEDTIME AS NEEDED FOR PAIN 30 tablet 0  . diltiazem (CARDIZEM SR) 120 MG 12 hr capsule Take 1 capsule (120 mg total) by mouth 2 (two) times daily. 180 capsule 2  . dorzolamide (TRUSOPT) 2 % ophthalmic solution 1 drop daily.    . dorzolamide-timolol  (COSOPT) 22.3-6.8 MG/ML ophthalmic solution     . Iodine, Kelp, (KELP PO) Take 1 tablet by mouth daily.      . isosorbide mononitrate (IMDUR) 30 MG 24 hr tablet Take 1 tablet by mouth  every night at bedtime 90 tablet 1  . Lactobacillus (ACIDOPHILUS PO) Take 1 capsule by mouth daily.      Marland Kitchen latanoprost (XALATAN) 0.005 % ophthalmic solution Place 1 drop into both eyes at bedtime.      Marland Kitchen losartan (COZAAR) 100 MG tablet Take one-half tablet by   mouth twice a day for blood pressure 90 tablet 2  . meloxicam (MOBIC) 15 MG tablet Take 1 tablet by mouth  daily with food as needed  for pain 90 tablet 2  . metoprolol (TOPROL-XL) 200 MG 24 hr tablet Take 1 tablet by mouth  every day 90 tablet 2  . OVER THE COUNTER MEDICATION Take 75 mg by mouth daily. Zinc 75mg      . OVER THE COUNTER MEDICATION Take 1 capsule by mouth every evening. Herbavision. Hold while in hospital     . traMADol (ULTRAM) 50 MG tablet Take 50 mg by mouth every 6 (six) hours as needed (usually takes 1 a day).     No current facility-administered medications on file prior to visit.   Allergies  Allergen Reactions  . Lactose Intolerance (Gi)      BP 154/82 mmHg  Pulse 62  Temp(Src) 98.4 F (36.9 C) (Temporal)  Resp 18  Ht 5\' 3"  (1.6 m)  Wt 158 lb (71.668 kg)  BMI 28.00 kg/m2  SpO2 98% Wt Readings from Last 3 Encounters:  10/29/14 158 lb (71.668 kg)  08/17/14 162 lb (73.483 kg)  04/22/14 160 lb 6.4 oz (72.757 kg)   Objective:   Physical Exam  Constitutional: She is oriented to person, place, and time. She appears well-developed and well-nourished. She does not have a sickly appearance. She does not appear ill. No distress.  HENT:  Head: Normocephalic.  Right Ear: Tympanic membrane, external ear and ear canal normal.  Left Ear: External ear normal.  Nose: Nose normal. Right sinus exhibits no maxillary sinus tenderness and no frontal sinus tenderness. Left sinus exhibits no maxillary sinus tenderness and no frontal sinus  tenderness.  Mouth/Throat: Uvula is midline, oropharynx is clear and moist and mucous membranes are normal. Mucous membranes are not pale and not dry. No trismus in the jaw. No uvula swelling. No oropharyngeal exudate, posterior oropharyngeal edema, posterior oropharyngeal erythema or tonsillar abscesses.  Patient had cotton ball in left ear due to "exposed nerve" and refused exam in that ear.  Eyes: Conjunctivae, EOM and lids are normal. Right  eye exhibits no discharge. Left eye exhibits no discharge. No scleral icterus. Right pupil is round and reactive. Left pupil is round and reactive. Pupils are unequal.  Right pupil is larger (about 1mm) than the left pupil (about 78mm) due to Amblyopia.  Patient had Glaucoma surgery in 1973 and had bilateral iridectomies.  Per Dr. Melford Aase, Vicie Mutters, PA-C and Ball, PA-C, patient has always had this pupil discrepancy.    Neck: Trachea normal, normal range of motion and phonation normal. Neck supple. No tracheal deviation present.  Cardiovascular: Normal rate, regular rhythm, S1 normal, S2 normal, normal heart sounds, intact distal pulses and normal pulses.  Exam reveals no gallop, no distant heart sounds and no friction rub.   No murmur heard. Pulmonary/Chest: Effort normal and breath sounds normal. No stridor. No respiratory distress. She has no decreased breath sounds. She has no wheezes. She has no rhonchi. She has no rales. She exhibits no tenderness.  Abdominal: Soft. Bowel sounds are normal. There is no tenderness. There is no rebound and no guarding.  Lymphadenopathy:  No tenderness or LAD.  Neurological: She is alert and oriented to person, place, and time. She has normal strength. No cranial nerve deficit. Gait normal.  Skin: Skin is warm, dry and intact. No ecchymosis, no lesion and no rash noted. She is not diaphoretic. No erythema.  No erythema, swelling or warmth to touch to left leg.  Psychiatric: She has a normal mood and affect. Her  speech is normal and behavior is normal. Judgment and thought content normal. Cognition and memory are normal.  Vitals reviewed.  Assessment & Plan:  1. Muscle ache of extremity Continue all medications as prescribed. Continue Bengay cream for muscle aches. If you develop headache, change is vision/speech, facial drooping, arm or leg weakness, chest pain, shortness of breath, abdominal pain, nausea, vomiting, diarrhea, constipation, dizziness or lightheadedness, then go to ED immediately.  Discussed medication effects and SE's.  Pt agreed to treatment plan. If muscle ache does not get better or if you develop difficulty walking, then please call the office. Please keep your physical appt on 12/01/14.  Lorik Guo, Stephani Police, PA-C 11:53 PM [facility]

## 2014-10-29 NOTE — Patient Instructions (Addendum)
-Continue medications as prescribed. -Continue Bengay on left leg for muscle aches.  Please keep your physical appt on 12/01/14. If muscle ache does not get better or if you develop difficulty walking, then please call the office. If you develop headache, change is vision/speech, facial drooping, arm or leg weakness, chest pain, shortness of breath, abdominal pain, nausea, vomiting, diarrhea, constipation, dizziness or lightheadedness, then go to ED immediately.  Fall Prevention and Home Safety Falls cause injuries and can affect all age groups. It is possible to use preventive measures to significantly decrease the likelihood of falls. There are many simple measures which can make your home safer and prevent falls. OUTDOORS  Repair cracks and edges of walkways and driveways.  Remove high doorway thresholds.  Trim shrubbery on the main path into your home.  Have good outside lighting.  Clear walkways of tools, rocks, debris, and clutter.  Check that handrails are not broken and are securely fastened. Both sides of steps should have handrails.  Have leaves, snow, and ice cleared regularly.  Use sand or salt on walkways during winter months.  In the garage, clean up grease or oil spills. BATHROOM  Install night lights.  Install grab bars by the toilet and in the tub and shower.  Use non-skid mats or decals in the tub or shower.  Place a plastic non-slip stool in the shower to sit on, if needed.  Keep floors dry and clean up all water on the floor immediately.  Remove soap buildup in the tub or shower on a regular basis.  Secure bath mats with non-slip, double-sided rug tape.  Remove throw rugs and tripping hazards from the floors. BEDROOMS  Install night lights.  Make sure a bedside light is easy to reach.  Do not use oversized bedding.  Keep a telephone by your bedside.  Have a firm chair with side arms to use for getting dressed.  Remove throw rugs and tripping  hazards from the floor. KITCHEN  Keep handles on pots and pans turned toward the center of the stove. Use back burners when possible.  Clean up spills quickly and allow time for drying.  Avoid walking on wet floors.  Avoid hot utensils and knives.  Position shelves so they are not too high or low.  Place commonly used objects within easy reach.  If necessary, use a sturdy step stool with a grab bar when reaching.  Keep electrical cables out of the way.  Do not use floor polish or wax that makes floors slippery. If you must use wax, use non-skid floor wax.  Remove throw rugs and tripping hazards from the floor. STAIRWAYS  Never leave objects on stairs.  Place handrails on both sides of stairways and use them. Fix any loose handrails. Make sure handrails on both sides of the stairways are as long as the stairs.  Check carpeting to make sure it is firmly attached along stairs. Make repairs to worn or loose carpet promptly.  Avoid placing throw rugs at the top or bottom of stairways, or properly secure the rug with carpet tape to prevent slippage. Get rid of throw rugs, if possible.  Have an electrician put in a light switch at the top and bottom of the stairs. OTHER FALL PREVENTION TIPS  Wear low-heel or rubber-soled shoes that are supportive and fit well. Wear closed toe shoes.  When using a stepladder, make sure it is fully opened and both spreaders are firmly locked. Do not climb a closed stepladder.  Add color or contrast paint or tape to grab bars and handrails in your home. Place contrasting color strips on first and last steps.  Learn and use mobility aids as needed. Install an electrical emergency response system.  Turn on lights to avoid dark areas. Replace light bulbs that burn out immediately. Get light switches that glow.  Arrange furniture to create clear pathways. Keep furniture in the same place.  Firmly attach carpet with non-skid or double-sided  tape.  Eliminate uneven floor surfaces.  Select a carpet pattern that does not visually hide the edge of steps.  Be aware of all pets. OTHER HOME SAFETY TIPS  Set the water temperature for 120 F (48.8 C).  Keep emergency numbers on or near the telephone.  Keep smoke detectors on every level of the home and near sleeping areas. Document Released: 11/03/2002 Document Revised: 05/14/2012 Document Reviewed: 02/02/2012 [employer] Patient Information 2015 [place], [place]. This information is not intended to replace advice given to you by your health care provider. Make sure you discuss any questions you have with your health care provider.

## 2014-11-11 ENCOUNTER — Encounter: Payer: Self-pay | Admitting: Internal Medicine

## 2014-11-26 ENCOUNTER — Other Ambulatory Visit: Payer: Self-pay | Admitting: Physician Assistant

## 2014-12-01 ENCOUNTER — Encounter: Payer: Self-pay | Admitting: Internal Medicine

## 2014-12-01 ENCOUNTER — Ambulatory Visit (INDEPENDENT_AMBULATORY_CARE_PROVIDER_SITE_OTHER): Payer: Medicare Other | Admitting: Internal Medicine

## 2014-12-01 VITALS — BP 132/84 | HR 88 | Temp 97.3°F | Resp 16 | Ht 62.5 in | Wt 161.2 lb

## 2014-12-01 DIAGNOSIS — Z1331 Encounter for screening for depression: Secondary | ICD-10-CM

## 2014-12-01 DIAGNOSIS — Z79899 Other long term (current) drug therapy: Secondary | ICD-10-CM

## 2014-12-01 DIAGNOSIS — Z1212 Encounter for screening for malignant neoplasm of rectum: Secondary | ICD-10-CM

## 2014-12-01 DIAGNOSIS — I10 Essential (primary) hypertension: Secondary | ICD-10-CM

## 2014-12-01 DIAGNOSIS — Z9181 History of falling: Secondary | ICD-10-CM

## 2014-12-01 DIAGNOSIS — R7309 Other abnormal glucose: Secondary | ICD-10-CM

## 2014-12-01 DIAGNOSIS — E559 Vitamin D deficiency, unspecified: Secondary | ICD-10-CM

## 2014-12-01 DIAGNOSIS — E785 Hyperlipidemia, unspecified: Secondary | ICD-10-CM

## 2014-12-01 LAB — CBC WITH DIFFERENTIAL/PLATELET
BASOS ABS: 0.1 10*3/uL (ref 0.0–0.1)
Basophils Relative: 1 % (ref 0–1)
Eosinophils Absolute: 0.2 10*3/uL (ref 0.0–0.7)
Eosinophils Relative: 2 % (ref 0–5)
HCT: 43.7 % (ref 36.0–46.0)
Hemoglobin: 14.7 g/dL (ref 12.0–15.0)
LYMPHS ABS: 2.3 10*3/uL (ref 0.7–4.0)
LYMPHS PCT: 26 % (ref 12–46)
MCH: 31.7 pg (ref 26.0–34.0)
MCHC: 33.6 g/dL (ref 30.0–36.0)
MCV: 94.4 fL (ref 78.0–100.0)
MPV: 9.4 fL (ref 8.6–12.4)
Monocytes Absolute: 0.6 10*3/uL (ref 0.1–1.0)
Monocytes Relative: 7 % (ref 3–12)
NEUTROS PCT: 64 % (ref 43–77)
Neutro Abs: 5.6 10*3/uL (ref 1.7–7.7)
PLATELETS: 299 10*3/uL (ref 150–400)
RBC: 4.63 MIL/uL (ref 3.87–5.11)
RDW: 13 % (ref 11.5–15.5)
WBC: 8.7 10*3/uL (ref 4.0–10.5)

## 2014-12-01 NOTE — Progress Notes (Signed)
Patient ID: [name], female   DOB: [birth year], 81 y.o.   MRN: [id number]  Annual Comprehensive Examination  This very nice 81 y.o.WWF presents for complete physical.  Patient has been followed for HTN, asHD  Prediabetes, Hyperlipidemia, and Vitamin D Deficiency.    HTN predates since 1989. Patient's BP has been controlled at home and patient denies any cardiac symptoms as chest pain, palpitations, shortness of breath, dizziness or ankle swelling. In 1994, patient had a PTCA and has done well since. In 12/2013, she was suspected to have a nonfocal TIA with a negative subsequent w/u. Patient does have Stage 3 CKDwith GFR 51 ml/min. Today's BP: 132/84 mmHg    Patient's hyperlipidemia is controlled with diet and medications. Patient denies myalgias or other medication SE's. Last lipids were at goal - Total Chol118; HDL  43; LDL  44; Trig 154 on 08/17/2014.   Patient has prediabetes predating since Oct 2012 with A1c 6.0%  and patient denies reactive hypoglycemic symptoms, visual blurring, diabetic polys, or paresthesias. Last A1c was  6.1% on 08/17/2014.   Finally, patient has history of Vitamin D Deficiency and last Vitamin D was  81 on 04/22/2014.  Medication Sig  . ALPHA LIPOIC ACID PO Take 200 mg by mouth daily.    Marland Kitchen amoxicillin (AMOXIL) 500 MG capsule TAKE 4 CAPSULES BY MOUTH 1-2 HOURS BEFORE DENTAL WORK  . aspirin EC 81 MG tablet Take 81 mg by mouth daily.    Marland Kitchen atorvastatin (LIPITOR) 40 MG tablet Take 10 mg by mouth every other day.    . B-COMPLEX WITH VITAMIN C Take 1 tablet by mouth daily.    . bumetanide (BUMEX) 2 MG tablet Take 1 tablet by mouth  daily for blood pressure  and fluid  . cholecalciferol (VITAMIN D) 1000 UNITS tablet Take  1000 units + 8000 units = 9000 units daily   . dorzolamide (TRUSOPT) 2 % ophth soln 1 drop daily.  . dorzolamide-timolol (COSOPT)  ophth soln   . Iodine, Kelp, (KELP PO) Take 1 tablet by mouth daily.    . isosorbide mononitrate (IMDUR) 30 MG  Take 1  tablet by mouth  every night at bedtime  . Lactobacillus (ACIDOPHILUS PO) Take 1 capsule by mouth daily.    Marland Kitchen latanoprost (XALATAN) 0.005 % ophth soln Place 1 drop into both eyes at bedtime.    Marland Kitchen losartan (COZAAR) 100 MG tablet Take one-half tablet by   mouth twice a day for blood pressure  . meloxicam (MOBIC) 15 MG tablet Take 1 tablet by mouth  daily with food as needed  for pain  . metoprolol (TOPROL-XL) 200 MG 24 hr tablet Take 1 tablet by mouth  every day  . OVER THE COUNTER MEDICATION Take 75 mg by mouth daily. Zinc 75mg    . OVER THE COUNTER MEDICATION Take 1 capsule by mouth every evening. Herbavision. Hold while in hospital   . traMADol (ULTRAM) 50 MG tablet TAKE 1 TABLET BY MOUTH TWICE DAILY AS NEEDED FOR PAIN   Allergies  Allergen Reactions  . Lactose Intolerance (Gi)    Past Medical History  Diagnosis Date  . Meningitis   . Macular degeneration   . Hypertension   . GERD (gastroesophageal reflux disease)   . Hyperlipidemia   . Elevated hemoglobin A1c   . Cataract   . Cancer 1979 mastectomy  . Glaucoma     Right pupil (about 49mm) is larger than left pupil (about 7mm) due to surgery in 1973.  Amblyopia in  right eye.   Health Maintenance  Topic Date Due  . COLONOSCOPY  04/01/1974  . ZOSTAVAX  04/01/1984  . DEXA SCAN  04/01/1989  . TETANUS/TDAP  11/27/2012  . INFLUENZA VACCINE  06/28/2015  . PNEUMOCOCCAL POLYSACCHARIDE VACCINE AGE 68 AND OVER  Completed   Immunization History  Administered Date(s) Administered  . DT 01/21/2014  . Influenza Split 10/13/2013  . Influenza, High Dose Seasonal PF 08/17/2014  . Influenza-Unspecified 11/27/2010  . Pneumococcal-Unspecified 11/27/2000, 11/27/2009  . Td 11/27/2002   Past Surgical History  Procedure Laterality Date  . Abdominal hysterectomy    . Mastectomy    . Coronary stent placement    . Colon resection    . Eye surgery  1973    Dr. Katy Fitch  . Breast surgery Left 1979    mastectomy  . Skin cancer excision  2008      Squamous cell - left thigh   Family History  Problem Relation Age of Onset  . Hypertension Mother   . Hypertension Father   . CVA Father   . Arthritis Sister     Rheumatoid  . Heart attack Brother   . Heart disease Brother   . Diabetes Daughter   . Hypertension Daughter    History  Substance Use Topics  . Smoking status: Never Smoker   . Smokeless tobacco: Not on file  . Alcohol Use: No    ROS Constitutional: Denies fever, chills, weight loss/gain, headaches, insomnia, fatigue, night sweats, and change in appetite. Eyes: Denies redness, blurred vision, diplopia, discharge, itchy, watery eyes.  ENT: Denies discharge, congestion, post nasal drip, epistaxis, sore throat, earache, hearing loss, dental pain, Tinnitus, Vertigo, Sinus pain, snoring.  Cardio: Denies chest pain, palpitations, irregular heartbeat, syncope, dyspnea, diaphoresis, orthopnea, PND, claudication, edema Respiratory: denies cough, dyspnea, DOE, pleurisy, hoarseness, laryngitis, wheezing.  Gastrointestinal: Denies dysphagia, heartburn, reflux, water brash, pain, cramps, nausea, vomiting, bloating, diarrhea, constipation, hematemesis, melena, hematochezia, jaundice, hemorrhoids Genitourinary: Denies dysuria, frequency, urgency, nocturia, hesitancy, discharge, hematuria, flank pain Breast: Breast lumps, nipple discharge, bleeding.  Musculoskeletal: Denies arthralgia, myalgia, stiffness, Jt. Swelling, pain, limp, and strain/sprain. Denies falls. Skin: Denies puritis, rash, hives, warts, acne, eczema, changing in skin lesion Neuro: No weakness, tremor, incoordination, spasms, paresthesia, pain Psychiatric: Denies confusion, memory loss, sensory loss. Denies Depression. Endocrine: Denies change in weight, skin, hair change, nocturia, and paresthesia, diabetic polys, visual blurring, hyper / hypo glycemic episodes.  Heme/Lymph: No excessive bleeding, bruising, enlarged lymph nodes.  Physical Exam  BP 132/84   Pulse 88   Temp 97.3 F   Resp 16  Ht 5' 2.5"  Wt 161 lb 3.2 oz    BMI 29.00  General Appearance: Well nourished and in no apparent distress. Eyes: PERRLA, EOMs, conjunctiva no swelling or erythema, normal fundi and vessels. Sinuses: No frontal/maxillary tenderness ENT/Mouth: EACs patent / TMs  nl. Nares clear without erythema, swelling, mucoid exudates. Oral hygiene is good. No erythema, swelling, or exudate. Tongue normal, non-obstructing. Tonsils not swollen or erythematous. Hearing normal.  Neck: Supple, thyroid normal. No bruits, nodes or JVD. Respiratory: Respiratory effort normal.  BS equal and clear bilateral without rales, rhonci, wheezing or stridor. Cardio: Heart sounds are normal with regular rate and rhythm and no murmurs, rubs or gallops. Peripheral pulses are normal and equal bilaterally without edema. No aortic or femoral bruits. Chest: symmetric with normal excursions and percussion. Breasts: Symmetric, without lumps, nipple discharge, retractions, or fibrocystic changes.  Abdomen: Flat, soft, with bowl sounds. Nontender, no guarding, rebound, hernias,  masses, or organomegaly.  Lymphatics: Non tender without lymphadenopathy.  Genitourinary:  Musculoskeletal: Full ROM all peripheral extremities, joint stability, 5/5 strength, and normal gait. Skin: Warm and dry without rashes, lesions, cyanosis, clubbing or  ecchymosis.  Neuro: Cranial nerves intact, reflexes equal bilaterally. Normal muscle tone, no cerebellar symptoms. Sensation intact.  Pysch: Awake and oriented X 3, normal affect, Insight and Judgment appropriate.   Assessment and Plan  1. Essential hypertension  - Microalbumin / creatinine urine ratio - EKG 12-Lead - [place], RETROPERITNL ABD,  LTD - TSH  2. Hyperlipidemia  - Lipid panel  3. PreDiabetes  - Hemoglobin A1c - Insulin, fasting  4. Vitamin D deficiency  - Vit D  25 hydroxy (rtn osteoporosis monitoring)  5. Screening for rectal cancer  - POC Hemoccult  Bld/Stl (3-Cd Home Screen); Future  6. Depression screen   7. At low risk for fall   8. Medication management  - Urine Microscopic - CBC with Differential - BASIC METABOLIC PANEL WITH GFR - Hepatic function panel - Magnesium   Continue prudent diet as discussed, weight control, BP monitoring, regular exercise, and medications. Discussed med's effects and SE's. Screening labs and tests as requested with regular follow-up as recommended.

## 2014-12-01 NOTE — Patient Instructions (Signed)
Recommend the book "The END of DIETING" by Dr Baker Janus   and the book "The END of DIABETES " by Dr Excell Seltzer  At [email or web address] - get book & Audio CD's      Being diabetic has a  300% increased risk for heart attack, stroke, cancer, and alzheimer- type vascular dementia. It is very important that you work harder with diet by avoiding all foods that are white except chicken & fish. Avoid white rice (brown & wild rice is OK), white potatoes (sweetpotatoes in moderation is OK), White bread or wheat bread or anything made out of white flour like bagels, donuts, rolls, buns, biscuits, cakes, pastries, cookies, pizza crust, and pasta (made from white flour & egg whites) - vegetarian pasta or spinach or wheat pasta is OK. Multigrain breads like Arnold's or [employer], or multigrain sandwich thins or flatbreads.  Diet, exercise and weight loss can reverse and cure diabetes in the early stages.  Diet, exercise and weight loss is very important in the control and prevention of complications of diabetes which affects every system in your body, ie. Brain - dementia/stroke, eyes - glaucoma/blindness, heart - heart attack/heart failure, kidneys - dialysis, stomach - gastric paralysis, intestines - malabsorption, nerves - severe painful neuritis, circulation - gangrene & loss of a leg(s), and finally cancer and Alzheimers.    I recommend avoid fried & greasy foods,  sweets/candy, white rice (brown or wild rice or Quinoa is OK), white potatoes (sweet potatoes are OK) - anything made from white flour - bagels, doughnuts, rolls, buns, biscuits,white and wheat breads, pizza crust and traditional pasta made of white flour & egg white(vegetarian pasta or spinach or wheat pasta is OK).  Multi-grain bread is OK - like multi-grain flat bread or sandwich thins. Avoid alcohol in excess. Exercise is also important.    Eat all the vegetables you want - avoid meat, especially red meat and dairy - especially cheese.  Cheese  is the most concentrated form of trans-fats which is the worst thing to clog up our arteries. Veggie cheese is OK which can be found in the fresh produce section at [employer] or [employer] or [employer]  Preventive Care for Adults A healthy lifestyle and preventive care can promote health and wellness. Preventive health guidelines for women include the following key practices.  A routine yearly physical is a good way to check with your health care provider about your health and preventive screening. It is a chance to share any concerns and updates on your health and to receive a thorough exam.  Visit your dentist for a routine exam and preventive care every 6 months. Brush your teeth twice a day and floss once a day. Good oral hygiene prevents tooth decay and gum disease.  The frequency of eye exams is based on your age, health, family medical history, use of contact lenses, and other factors. Follow your health care provider's recommendations for frequency of eye exams.  Eat a healthy diet. Foods like vegetables, fruits, whole grains, low-fat dairy products, and lean protein foods contain the nutrients you need without too many calories. Decrease your intake of foods high in solid fats, added sugars, and salt. Eat the right amount of calories for you.Get information about a proper diet from your health care provider, if necessary.  Regular physical exercise is one of the most important things you can do for your health. Most adults should get at least 150 minutes of moderate-intensity exercise (any activity that increases  your heart rate and causes you to sweat) each week. In addition, most adults need muscle-strengthening exercises on 2 or more days a week.  Maintain a healthy weight. The body mass index (BMI) is a screening tool to identify possible weight problems. It provides an estimate of body fat based on height and weight. Your health care provider can find your BMI and can help you  achieve or maintain a healthy weight.For adults 20 years and older:  A BMI below 18.5 is considered underweight.  A BMI of 18.5 to 24.9 is normal.  A BMI of 25 to 29.9 is considered overweight.  A BMI of 30 and above is considered obese.  Maintain normal blood lipids and cholesterol levels by exercising and minimizing your intake of saturated fat. Eat a balanced diet with plenty of fruit and vegetables. If your lipid or cholesterol levels are high, you are over 50, or you are at high risk for heart disease, you may need your cholesterol levels checked more frequently.Ongoing high lipid and cholesterol levels should be treated with medicines if diet and exercise are not working.  If you smoke, find out from your health care provider how to quit. If you do not use tobacco, do not start.  Lung cancer screening is recommended for adults aged 55-80 years who are at high risk for developing lung cancer because of a history of smoking. A yearly low-dose CT scan of the lungs is recommended for people who have at least a 30-pack-year history of smoking and are a current smoker or have quit within the past 15 years. A pack year of smoking is smoking an average of 1 pack of cigarettes a day for 1 year (for example: 1 pack a day for 30 years or 2 packs a day for 15 years). Yearly screening should continue until the smoker has stopped smoking for at least 15 years. Yearly screening should be stopped for people who develop a health problem that would prevent them from having lung cancer treatment.  Avoid use of street drugs. Do not share needles with anyone. Ask for help if you need support or instructions about stopping the use of drugs.  High blood pressure causes heart disease and increases the risk of stroke.  Ongoing high blood pressure should be treated with medicines if weight loss and exercise do not work.  If you are 55-79 years old, ask your health care provider if you should take aspirin to  prevent strokes.  Diabetes screening involves taking a blood sample to check your fasting blood sugar level. This should be done once every 3 years, after age 45, if you are within normal weight and without risk factors for diabetes. Testing should be considered at a younger age or be carried out more frequently if you are overweight and have at least 1 risk factor for diabetes.  Breast cancer screening is essential preventive care for women. You should practice "breast self-awareness." This means understanding the normal appearance and feel of your breasts and may include breast self-examination. Any changes detected, no matter how small, should be reported to a health care provider. Women in their 20s and 30s should have a clinical breast exam (CBE) by a health care provider as part of a regular health exam every 1 to 3 years. After age 40, women should have a CBE every year. Starting at age 40, women should consider having a mammogram (breast X-ray test) every year. Women who have a family history of breast cancer should   talk to their health care provider about genetic screening. Women at a high risk of breast cancer should talk to their health care providers about having an MRI and a mammogram every year.  Breast cancer gene (BRCA)-related cancer risk assessment is recommended for women who have family members with BRCA-related cancers. BRCA-related cancers include breast, ovarian, tubal, and peritoneal cancers. Having family members with these cancers may be associated with an increased risk for harmful changes (mutations) in the breast cancer genes BRCA1 and BRCA2. Results of the assessment will determine the need for genetic counseling and BRCA1 and BRCA2 testing.  Routine pelvic exams to screen for cancer are no longer recommended for nonpregnant women who are considered low risk for cancer of the pelvic organs (ovaries, uterus, and vagina) and who do not have symptoms. Ask your health care provider  if a screening pelvic exam is right for you.  If you have had past treatment for cervical cancer or a condition that could lead to cancer, you need Pap tests and screening for cancer for at least 20 years after your treatment. If Pap tests have been discontinued, your risk factors (such as having a new sexual partner) need to be reassessed to determine if screening should be resumed. Some women have medical problems that increase the chance of getting cervical cancer. In these cases, your health care provider may recommend more frequent screening and Pap tests.    Colorectal cancer can be detected and often prevented. Most routine colorectal cancer screening begins at the age of 90 years and continues through age 8 years. However, your health care provider may recommend screening at an earlier age if you have risk factors for colon cancer. On a yearly basis, your health care provider may provide home test kits to check for hidden blood in the stool. Use of a small camera at the end of a tube, to directly examine the colon (sigmoidoscopy or colonoscopy), can detect the earliest forms of colorectal cancer. Talk to your health care provider about this at age 86, when routine screening begins. Direct exam of the colon should be repeated every 5-10 years through age 32 years, unless early forms of pre-cancerous polyps or small growths are found.  Osteoporosis is a disease in which the bones lose minerals and strength with aging. This can result in serious bone fractures or breaks. The risk of osteoporosis can be identified using a bone density scan. Women ages 75 years and over and women at risk for fractures or osteoporosis should discuss screening with their health care providers. Ask your health care provider whether you should take a calcium supplement or vitamin D to reduce the rate of osteoporosis.  Menopause can be associated with physical symptoms and risks. Hormone replacement therapy is available to  decrease symptoms and risks. You should talk to your health care provider about whether hormone replacement therapy is right for you.  Use sunscreen. Apply sunscreen liberally and repeatedly throughout the day. You should seek shade when your shadow is shorter than you. Protect yourself by wearing long sleeves, pants, a wide-brimmed hat, and sunglasses year round, whenever you are outdoors.  Once a month, do a whole body skin exam, using a mirror to look at the skin on your back. Tell your health care provider of new moles, moles that have irregular borders, moles that are larger than a pencil eraser, or moles that have changed in shape or color.  Stay current with required vaccines (immunizations).  Influenza vaccine. All adults  should be immunized every year.  Tetanus, diphtheria, and acellular pertussis (Td, Tdap) vaccine. Pregnant women should receive 1 dose of Tdap vaccine during each pregnancy. The dose should be obtained regardless of the length of time since the last dose. Immunization is preferred during the 27th-36th week of gestation. An adult who has not previously received Tdap or who does not know her vaccine status should receive 1 dose of Tdap. This initial dose should be followed by tetanus and diphtheria toxoids (Td) booster doses every 10 years. Adults with an unknown or incomplete history of completing a 3-dose immunization series with Td-containing vaccines should begin or complete a primary immunization series including a Tdap dose. Adults should receive a Td booster every 10 years.    Zoster vaccine. One dose is recommended for adults aged 44 years or older unless certain conditions are present.    Pneumococcal 13-valent conjugate (PCV13) vaccine. When indicated, a person who is uncertain of her immunization history and has no record of immunization should receive the PCV13 vaccine. An adult aged 74 years or older who has certain medical conditions and has not been previously  immunized should receive 1 dose of PCV13 vaccine. This PCV13 should be followed with a dose of pneumococcal polysaccharide (PPSV23) vaccine. The PPSV23 vaccine dose should be obtained at least 8 weeks after the dose of PCV13 vaccine. An adult aged 66 years or older who has certain medical conditions and previously received 1 or more doses of PPSV23 vaccine should receive 1 dose of PCV13. The PCV13 vaccine dose should be obtained 1 or more years after the last PPSV23 vaccine dose.    Pneumococcal polysaccharide (PPSV23) vaccine. When PCV13 is also indicated, PCV13 should be obtained first. All adults aged 89 years and older should be immunized. An adult younger than age 80 years who has certain medical conditions should be immunized. Any person who resides in a nursing home or long-term care facility should be immunized. An adult smoker should be immunized. People with an immunocompromised condition and certain other conditions should receive both PCV13 and PPSV23 vaccines. People with human immunodeficiency virus (HIV) infection should be immunized as soon as possible after diagnosis. Immunization during chemotherapy or radiation therapy should be avoided. Routine use of PPSV23 vaccine is not recommended for American Indians, [place] Natives, or people younger than 65 years unless there are medical conditions that require PPSV23 vaccine. When indicated, people who have unknown immunization and have no record of immunization should receive PPSV23 vaccine. One-time revaccination 5 years after the first dose of PPSV23 is recommended for people aged 19-64 years who have chronic kidney failure, nephrotic syndrome, asplenia, or immunocompromised conditions. People who received 1-2 doses of PPSV23 before age 34 years should receive another dose of PPSV23 vaccine at age 31 years or later if at least 5 years have passed since the previous dose. Doses of PPSV23 are not needed for people immunized with PPSV23 at or after  age 29 years.   Preventive Services / Frequency  Ages 55 years and over  Blood pressure check.  Lipid and cholesterol check.  Lung cancer screening. / Every year if you are aged 29-80 years and have a 30-pack-year history of smoking and currently smoke or have quit within the past 15 years. Yearly screening is stopped once you have quit smoking for at least 15 years or develop a health problem that would prevent you from having lung cancer treatment.  Clinical breast exam.** / Every year after age 40 years.  BRCA-related cancer risk assessment.** / For women who have family members with a BRCA-related cancer (breast, ovarian, tubal, or peritoneal cancers).  Mammogram.** / Every year beginning at age 40 years and continuing for as long as you are in good health. Consult with your health care provider.  Pap test.** / Every 3 years starting at age 30 years through age 65 or 70 years with 3 consecutive normal Pap tests. Testing can be stopped between 65 and 70 years with 3 consecutive normal Pap tests and no abnormal Pap or HPV tests in the past 10 years.  Fecal occult blood test (FOBT) of stool. / Every year beginning at age 50 years and continuing until age 75 years. You may not need to do this test if you get a colonoscopy every 10 years.  Flexible sigmoidoscopy or colonoscopy.** / Every 5 years for a flexible sigmoidoscopy or every 10 years for a colonoscopy beginning at age 50 years and continuing until age 75 years.  Hepatitis C blood test.** / For all people born from 1945 through 1965 and any individual with known risks for hepatitis C.  Osteoporosis screening.** / A one-time screening for women ages 65 years and over and women at risk for fractures or osteoporosis.  Skin self-exam. / Monthly.  Influenza vaccine. / Every year.  Tetanus, diphtheria, and acellular pertussis (Tdap/Td) vaccine.** / 1 dose of Td every 10 years.  Zoster vaccine.** / 1 dose for adults aged 60 years  or older.  Pneumococcal 13-valent conjugate (PCV13) vaccine.** / Consult your health care provider.  Pneumococcal polysaccharide (PPSV23) vaccine.** / 1 dose for all adults aged 65 years and older. Screening for abdominal aortic aneurysm (AAA)  by ultrasound is recommended for people who have history of high blood pressure or who are current or former smokers. 

## 2014-12-02 ENCOUNTER — Telehealth: Payer: Self-pay | Admitting: *Deleted

## 2014-12-02 LAB — URINALYSIS, MICROSCOPIC ONLY
CRYSTALS: NONE SEEN
Casts: NONE SEEN

## 2014-12-02 LAB — HEMOGLOBIN A1C
Hgb A1c MFr Bld: 5.9 % — ABNORMAL HIGH (ref <5.7)
Mean Plasma Glucose: 123 mg/dL — ABNORMAL HIGH (ref <117)

## 2014-12-02 LAB — BASIC METABOLIC PANEL WITH GFR
BUN: 19 mg/dL (ref 6–23)
CO2: 31 mEq/L (ref 19–32)
Calcium: 9.6 mg/dL (ref 8.4–10.5)
Chloride: 104 mEq/L (ref 96–112)
Creat: 0.87 mg/dL (ref 0.50–1.10)
GFR, EST NON AFRICAN AMERICAN: 59 mL/min — AB
GFR, Est African American: 68 mL/min
Glucose, Bld: 96 mg/dL (ref 70–99)
POTASSIUM: 3.7 meq/L (ref 3.5–5.3)
Sodium: 142 mEq/L (ref 135–145)

## 2014-12-02 LAB — LIPID PANEL
CHOL/HDL RATIO: 2.8 ratio
CHOLESTEROL: 125 mg/dL (ref 0–200)
HDL: 44 mg/dL (ref >39)
LDL Cholesterol: 53 mg/dL (ref 0–99)
TRIGLYCERIDES: 139 mg/dL (ref <150)
VLDL: 28 mg/dL (ref 0–40)

## 2014-12-02 LAB — MICROALBUMIN / CREATININE URINE RATIO
Creatinine, Urine: 99.3 mg/dL
Microalb Creat Ratio: 18.1 mg/g (ref 0.0–30.0)
Microalb, Ur: 1.8 mg/dL (ref <2.0)

## 2014-12-02 LAB — TSH: TSH: 2.901 u[IU]/mL (ref 0.350–4.500)

## 2014-12-02 LAB — HEPATIC FUNCTION PANEL
ALT: 22 U/L (ref 0–35)
AST: 22 U/L (ref 0–37)
Albumin: 3.9 g/dL (ref 3.5–5.2)
Alkaline Phosphatase: 86 U/L (ref 39–117)
BILIRUBIN DIRECT: 0.1 mg/dL (ref 0.0–0.3)
Indirect Bilirubin: 0.6 mg/dL (ref 0.2–1.2)
Total Bilirubin: 0.7 mg/dL (ref 0.2–1.2)
Total Protein: 6.7 g/dL (ref 6.0–8.3)

## 2014-12-02 LAB — INSULIN, FASTING: Insulin fasting, serum: 11.7 u[IU]/mL (ref 2.0–19.6)

## 2014-12-02 LAB — VITAMIN D 25 HYDROXY (VIT D DEFICIENCY, FRACTURES): Vit D, 25-Hydroxy: 85 ng/mL (ref 30–100)

## 2014-12-02 LAB — MAGNESIUM: MAGNESIUM: 2.1 mg/dL (ref 1.5–2.5)

## 2014-12-02 NOTE — Telephone Encounter (Signed)
Patient called and states she is having left neck pain that started yesterday afternoon.  Patient fell in our office when she got over-balanced.  Dr Melford Aase evaluated the patient after her fall and she appeared to be OK.  Patient is having no neurological symptoms today such as headache , weakness or blurred vision.  Per Dr Melford Aase, apply heat to her neck and try Advil or Tylenol.  Patient aware and will call back if she does not improve.

## 2014-12-05 ENCOUNTER — Other Ambulatory Visit: Payer: Self-pay | Admitting: Internal Medicine

## 2014-12-05 MED ORDER — TIZANIDINE HCL 4 MG PO TABS: ORAL_TABLET | ORAL | Status: AC

## 2014-12-23 ENCOUNTER — Emergency Department (HOSPITAL_COMMUNITY)
Admission: EM | Admit: 2014-12-23 | Discharge: 2014-12-23 | Disposition: A | Discharge status: Home / Self Care | Payer: Medicare Other | Attending: Emergency Medicine | Admitting: Emergency Medicine

## 2014-12-23 ENCOUNTER — Other Ambulatory Visit: Payer: Self-pay | Admitting: *Deleted

## 2014-12-23 ENCOUNTER — Telehealth: Payer: Self-pay | Admitting: *Deleted

## 2014-12-23 DIAGNOSIS — Z7982 Long term (current) use of aspirin: Secondary | ICD-10-CM | POA: Insufficient documentation

## 2014-12-23 DIAGNOSIS — Z9861 Coronary angioplasty status: Secondary | ICD-10-CM | POA: Insufficient documentation

## 2014-12-23 DIAGNOSIS — H269 Unspecified cataract: Secondary | ICD-10-CM | POA: Diagnosis not present

## 2014-12-23 DIAGNOSIS — I10 Essential (primary) hypertension: Secondary | ICD-10-CM | POA: Diagnosis not present

## 2014-12-23 DIAGNOSIS — Z792 Long term (current) use of antibiotics: Secondary | ICD-10-CM | POA: Insufficient documentation

## 2014-12-23 DIAGNOSIS — Z8589 Personal history of malignant neoplasm of other organs and systems: Secondary | ICD-10-CM | POA: Insufficient documentation

## 2014-12-23 DIAGNOSIS — H409 Unspecified glaucoma: Secondary | ICD-10-CM | POA: Diagnosis not present

## 2014-12-23 DIAGNOSIS — Z79899 Other long term (current) drug therapy: Secondary | ICD-10-CM | POA: Diagnosis not present

## 2014-12-23 DIAGNOSIS — Z8619 Personal history of other infectious and parasitic diseases: Secondary | ICD-10-CM | POA: Diagnosis not present

## 2014-12-23 DIAGNOSIS — Z8639 Personal history of other endocrine, nutritional and metabolic disease: Secondary | ICD-10-CM | POA: Diagnosis not present

## 2014-12-23 LAB — URINALYSIS, ROUTINE W REFLEX MICROSCOPIC
Bilirubin Urine: NEGATIVE
GLUCOSE, UA: 100 mg/dL — AB
HGB URINE DIPSTICK: NEGATIVE
Ketones, ur: NEGATIVE mg/dL
LEUKOCYTES UA: NEGATIVE
NITRITE: NEGATIVE
Protein, ur: NEGATIVE mg/dL
SPECIFIC GRAVITY, URINE: 1.009 (ref 1.005–1.030)
Urobilinogen, UA: 0.2 mg/dL (ref 0.0–1.0)
pH: 7.5 (ref 5.0–8.0)

## 2014-12-23 LAB — BASIC METABOLIC PANEL
ANION GAP: 6 (ref 5–15)
BUN: 19 mg/dL (ref 6–23)
CALCIUM: 9.4 mg/dL (ref 8.4–10.5)
CO2: 28 mmol/L (ref 19–32)
Chloride: 106 mmol/L (ref 96–112)
Creatinine, Ser: 0.91 mg/dL (ref 0.50–1.10)
GFR calc non Af Amer: 54 mL/min — ABNORMAL LOW (ref >90)
GFR, EST AFRICAN AMERICAN: 62 mL/min — AB (ref >90)
GLUCOSE: 135 mg/dL — AB (ref 70–99)
POTASSIUM: 3.6 mmol/L (ref 3.5–5.1)
SODIUM: 140 mmol/L (ref 135–145)

## 2014-12-23 LAB — CBC
HEMATOCRIT: 40.7 % (ref 36.0–46.0)
Hemoglobin: 13.4 g/dL (ref 12.0–15.0)
MCH: 31.1 pg (ref 26.0–34.0)
MCHC: 32.9 g/dL (ref 30.0–36.0)
MCV: 94.4 fL (ref 78.0–100.0)
Platelets: 214 10*3/uL (ref 150–400)
RBC: 4.31 MIL/uL (ref 3.87–5.11)
RDW: 13.7 % (ref 11.5–15.5)
WBC: 8 10*3/uL (ref 4.0–10.5)

## 2014-12-23 MED ORDER — MINOXIDIL 2.5 MG PO TABS: ORAL_TABLET | ORAL | Status: DC

## 2014-12-23 NOTE — ED Notes (Signed)
PER EMS: pt from home, reports her BP was 191/100 and her PCP told her to come to the ED. Pt A&OX4, denies HA, CP or any other pain. HR-74, O2-97% RA.

## 2014-12-23 NOTE — ED Provider Notes (Signed)
CSN: [id number]     Arrival date & time 12/23/14  1937 History   First MD Initiated Contact with Patient 12/23/14 1938     Chief Complaint  Patient presents with  . Hypertension     (Consider location/radiation/quality/duration/timing/severity/associated sxs/prior Treatment) HPI  Pt presenting with c/o hypertension.  She has been checking her blood pressure today and it has been elevated.  She called her doctor's office and he prescribed minoxidil for her to take daily- she took first dose today at noon.  As blood pressure remained elevated, patient states she was instructed to come to the ED for further evaluation.  No chest pain, no shortness of breath.  No change in vision. No focal weakness.  No fainting.  There are no other associated systemic symptoms, there are no other alleviating or modifying factors.   Past Medical History  Diagnosis Date  . Meningitis   . Macular degeneration   . Hypertension   . GERD (gastroesophageal reflux disease)   . Hyperlipidemia   . Elevated hemoglobin A1c   . Cataract   . Cancer 1979 mastectomy  . Glaucoma     Right pupil (about 41mm) is larger than left pupil (about 43mm) due to surgery in 1973.  Amblyopia in right eye.   Past Surgical History  Procedure Laterality Date  . Abdominal hysterectomy    . Mastectomy    . Coronary stent placement    . Colon resection    . Eye surgery  1973    Dr. Katy Fitch  . Breast surgery Left 1979    mastectomy  . Skin cancer excision  2008     Squamous cell - left thigh   Family History  Problem Relation Age of Onset  . Hypertension Mother   . Hypertension Father   . CVA Father   . Arthritis Sister     Rheumatoid  . Heart attack Brother   . Heart disease Brother   . Diabetes Daughter   . Hypertension Daughter    History  Substance Use Topics  . Smoking status: Never Smoker   . Smokeless tobacco: Not on file  . Alcohol Use: No   OB History    No data available     Review of Systems  ROS  reviewed and all otherwise negative except for mentioned in HPI    Allergies  Lactose intolerance (gi)  Home Medications   Prior to Admission medications   Medication Sig Start Date End Date Taking? Authorizing Provider  ALPHA LIPOIC ACID PO Take 200 mg by mouth daily.     Yes Historical Provider, MD  amoxicillin (AMOXIL) 500 MG capsule TAKE 4 CAPSULES BY MOUTH 1-2 HOURS BEFORE DENTAL WORK 02/09/14  Yes Unk Pinto, MD  aspirin EC 81 MG tablet Take 81 mg by mouth daily.     Yes Historical Provider, MD  B Complex-C (B-COMPLEX WITH VITAMIN C) tablet Take 1 tablet by mouth daily.     Yes Historical Provider, MD  bumetanide (BUMEX) 2 MG tablet Take 1 mg by mouth as needed (for swelling, edema).   Yes Historical Provider, MD  CHOLECALCIFEROL PO Take 9,000 Units by mouth daily.   Yes Historical Provider, MD  diltiazem (CARDIZEM) 120 MG tablet Take 120 mg by mouth 2 (two) times daily.   Yes Historical Provider, MD  dorzolamide-timolol (COSOPT) 22.3-6.8 MG/ML ophthalmic solution Place 1 drop into both eyes 2 (two) times daily. 12/03/14  Yes Historical Provider, MD  Garlic 3903 MG CAPS Take 1,000 mg by  mouth daily.   Yes Historical Provider, MD  Iodine, Kelp, (KELP PO) Take 1 tablet by mouth daily.     Yes Historical Provider, MD  isosorbide mononitrate (IMDUR) 30 MG 24 hr tablet Take 1 tablet by mouth  every night at bedtime 06/05/14  Yes Unk Pinto, MD  Lactobacillus (ACIDOPHILUS) CAPS capsule Take 1 capsule by mouth daily.   Yes Historical Provider, MD  latanoprost (XALATAN) 0.005 % ophthalmic solution Place 1 drop into both eyes at bedtime.     Yes Historical Provider, MD  losartan (COZAAR) 100 MG tablet Take 50 mg by mouth 2 (two) times daily.   Yes Historical Provider, MD  LUTEIN-ZEAXANTHIN PO Take 1 tablet by mouth daily.   Yes Historical Provider, MD  metoprolol (TOPROL-XL) 200 MG 24 hr tablet Take 100 mg by mouth 2 (two) times daily.   Yes Historical Provider, MD  minoxidil (LONITEN)  2.5 MG tablet Take 1 to 2 tablets daily for BP. Patient taking differently: Take 2.5 mg by mouth daily. Take 1 to 2 tablets daily for BP. 12/23/14  Yes Unk Pinto, MD  OVER THE COUNTER MEDICATION Take 1 capsule by mouth every evening. Herbavision. Hold while in hospital    Yes Historical Provider, MD  tiZANidine (ZANAFLEX) 4 MG tablet 1/2 to 1 tablet 3 x day if needed for muscle spasm if needed Patient taking differently: Take 4 mg by mouth 3 (three) times daily.  12/05/14 01/05/15 Yes Unk Pinto, MD  traMADol (ULTRAM) 50 MG tablet Take 50 mg by mouth 4 (four) times daily.   Yes Historical Provider, MD  Vitamins-Lipotropics (B-50 COMPLEX) TABS Take 1 tablet by mouth daily.   Yes Historical Provider, MD  Zinc 50 MG TABS Take 75 mg by mouth daily.   Yes Historical Provider, MD  bumetanide (BUMEX) 2 MG tablet Take 1 tablet by mouth  daily for blood pressure  and fluid 10/08/14   Unk Pinto, MD  losartan (COZAAR) 100 MG tablet Take one-half tablet by   mouth twice a day for blood pressure 08/04/14   Unk Pinto, MD  meloxicam [facility]) 15 MG tablet Take 1 tablet by mouth  daily with food as needed  for pain 08/04/14   Unk Pinto, MD  traMADol (ULTRAM) 50 MG tablet TAKE 1 TABLET BY MOUTH TWICE DAILY AS NEEDED FOR PAIN 11/28/14   Vicie Mutters, PA-C   BP 183/104 mmHg  Pulse 104  Temp(Src) 98.3 F (36.8 C) (Oral)  Resp 19  SpO2 98%  Vitals reviewed Physical Exam  Physical Examination: General appearance - alert, well appearing, and in no distress Mental status - alert, oriented to person, place, and time Eyes - pupils equal and reactive, extraocular eye movements intact Mouth - mucous membranes moist, pharynx normal without lesions Chest - clear to auscultation, no wheezes, rales or rhonchi, symmetric air entry Heart - normal rate, regular rhythm, normal S1, S2, no murmurs, rubs, clicks or gallops Abdomen - soft, nontender, nondistended, no masses or organomegaly Neurological -  alert, oriented x 3, normal speech, no cranial nerve deficit, strength 5/5 in extremiteis x 4,  Extremities - peripheral pulses normal, no pedal edema, no clubbing or cyanosis Skin - normal coloration and turgor, no rashes  ED Course  Procedures (including critical care time) Labs Review Labs Reviewed  BASIC METABOLIC PANEL - Abnormal; Notable for the following:    Glucose, Bld 135 (*)    GFR calc non Af Amer 54 (*)    GFR calc Af Amer 62 (*)  All other components within normal limits  URINALYSIS, ROUTINE W REFLEX MICROSCOPIC - Abnormal; Notable for the following:    Glucose, UA 100 (*)    All other components within normal limits  CBC    Imaging Review No results found.   EKG Interpretation   Date/Time:  Wednesday December 23 2014 83:15:17 EST Ventricular Rate:  70 PR Interval:  152 QRS Duration: 95 QT Interval:  410 QTC Calculation: 442 R Axis:   90 Text Interpretation:  Sinus rhythm Atrial premature complexes Borderline  right axis deviation Nonspecific T abnrm, anterolateral leads No  significant change since last tracing Confirmed by [facility]  MD, Buna Cuppett  [phone]) on 12/23/2014 8:13:34 PM      MDM   Final diagnoses:  Essential hypertension    Pt presenting with hypertension, reassuring exam.  Labs reassuring, ekg reassuring- no signs of end organ damage.Discharged with strict return precautions.  Pt agreeable with plan.    Threasa Beards, MD 12/23/14 [phone]

## 2014-12-23 NOTE — Telephone Encounter (Signed)
Patient's grand-daughter called and reports patient's Bp elevated at 180/100.Per Dr Melford Aase, continue Bumex 1 daily, Metoprolol 200 mg XL 1 daily, and she states she is also taking Diltiazem 120 mg BID.  Per Dr Melford Aase, send new RX for Minoxidil 2.5 mg 1-2 daily and instructed patient to start with 1 daily.

## 2014-12-23 NOTE — Discharge Instructions (Signed)
Return to the ED with any concerns including chest pain, difficulty breathing, weakness of arms or legs, changes in vision or speech, decreased level of alertness/lethargy, or any other alarming symptoms

## 2014-12-24 ENCOUNTER — Ambulatory Visit (INDEPENDENT_AMBULATORY_CARE_PROVIDER_SITE_OTHER): Payer: Medicare Other | Admitting: Physician Assistant

## 2014-12-24 ENCOUNTER — Encounter: Payer: Self-pay | Admitting: Physician Assistant

## 2014-12-24 VITALS — BP 142/80 | HR 60 | Temp 98.0°F | Resp 18 | Ht 62.5 in | Wt 160.0 lb

## 2014-12-24 DIAGNOSIS — I10 Essential (primary) hypertension: Secondary | ICD-10-CM

## 2014-12-24 DIAGNOSIS — Z79899 Other long term (current) drug therapy: Secondary | ICD-10-CM

## 2014-12-24 LAB — BASIC METABOLIC PANEL WITH GFR
BUN: 24 mg/dL — AB (ref 6–23)
CHLORIDE: 104 meq/L (ref 96–112)
CO2: 28 meq/L (ref 19–32)
CREATININE: 0.87 mg/dL (ref 0.50–1.10)
Calcium: 9.3 mg/dL (ref 8.4–10.5)
GFR, EST AFRICAN AMERICAN: 68 mL/min
GFR, Est Non African American: 59 mL/min — ABNORMAL LOW
Glucose, Bld: 88 mg/dL (ref 70–99)
POTASSIUM: 3.7 meq/L (ref 3.5–5.3)
Sodium: 140 mEq/L (ref 135–145)

## 2014-12-24 NOTE — Patient Instructions (Addendum)
-Continue medications as prescribed.  When checking your blood pressure, please sit for 5 minutes with feet touching the floor.  We want blood pressure below 140/90.  Keep appt on 03/10/15   Orthostatic Hypotension Orthostatic hypotension is a sudden drop in blood pressure. It happens when you quickly stand up from a seated or lying position. You may feel dizzy or light-headed. This can last for just a few seconds or for up to a few minutes. It is usually not a serious problem. However, if this happens frequently or gets worse, it can be a sign of something more serious. CAUSES  Different things can cause orthostatic hypotension, including:   Loss of body fluids (dehydration).  Medicines that lower blood pressure.  Sudden changes in posture, such as standing up quickly after you have been sitting or lying down.  Taking too much of your medicine. SIGNS AND SYMPTOMS   Light-headedness or dizziness.   Fainting or near-fainting.   A fast heart rate.   Weakness.   Feeling tired (fatigue).  DIAGNOSIS  Your health care provider may do several things to help diagnose your condition and identify the cause. These may include:   Taking a medical history and doing a physical exam.  Checking your blood pressure. Your health care provider will check your blood pressure when you are:  Lying down.  Sitting.  Standing.  Using tilt table testing. In this test, you lie down on a table that moves from a lying position to a standing position. You will be strapped onto the table. This test monitors your blood pressure and heart rate when you are in different positions. TREATMENT  Treatment will vary depending on the cause. Possible treatments include:   Changing the dosage of your medicines.  Wearing compression stockings on your lower legs.  Standing up slowly after sitting or lying down.  Eating more salt.  Eating frequent, small meals.  In some cases, getting IV  fluids.  Taking medicine to enhance fluid retention. HOME CARE INSTRUCTIONS  Only take over-the-counter or prescription medicines as directed by your health care provider.  Follow your health care provider's instructions for changing the dosage of your current medicines.  Do not stop or adjust your medicine on your own.  Stand up slowly after sitting or lying down. This allows your body to adjust to the different position.  Wear compression stockings as directed.  Eat extra salt as directed.  Do not add extra salt to your diet unless directed to by your health care provider.  Eat frequent, small meals.  Avoid standing suddenly after eating.  Avoid hot showers or excessive heat as directed by your health care provider.  Keep all follow-up appointments. SEEK MEDICAL CARE IF:  You continue to feel dizzy or light-headed after standing.  You feel groggy or confused.  You feel cold, clammy, or sick to your stomach (nauseous).  You have blurred vision.  You feel short of breath. SEEK IMMEDIATE MEDICAL CARE IF:   You faint after standing.  You have chest pain.  You have difficulty breathing.   You lose feeling or movement in your arms or legs.   You have slurred speech or difficulty talking, or you are unable to talk.  MAKE SURE YOU:   Understand these instructions.  Will watch your condition.  Will get help right away if you are not doing well or get worse. Document Released: 11/03/2002 Document Revised: 11/18/2013 Document Reviewed: 09/05/2013 [facility] Patient Information 2015 [place], [place]. This information is  not intended to replace advice given to you by your health care provider. Make sure you discuss any questions you have with your health care provider.

## 2014-12-24 NOTE — Progress Notes (Signed)
HPI  A Caucasian 81 y.o.female presents to the office today due to recent ED visit on 12/23/14 for elevated blood pressure.  Patient had BP's around 206/100 and 180/106 yesterday and called Dr. Melford Aase and he told her granddaughter to continue medications as prescribed and to start taking Minoxidil 2.5mg - 1 tablet daily.  Patient states she was taking those meds as prescribed and took the Minoxidil at noon on 12/23/14, but her BP was still elevated, so Dr. Melford Aase told her to go to ED so they can monitor her. Her BP there was 183/104. The EKG showed: "Text Interpretation: Sinus rhythm Atrial premature complexes Borderline  right axis deviation Nonspecific T abnrm, anterolateral leads No significant change since last tracing".  EKG on 12/01/14 shows Atrial Fibrillation?.  She denies CP, palpitations, SOB, change in speech/vision, weakness, syncope, headaches, dizziness and lightheadedness.  Review of Systems  Constitutional: Negative.  Negative for fever, chills, malaise/fatigue and diaphoresis.  HENT: Negative.   Eyes: Negative.   Respiratory: Negative.  Negative for cough, sputum production, shortness of breath and wheezing.   Cardiovascular: Negative.  Negative for chest pain, palpitations and leg swelling.  Gastrointestinal: Negative.  Negative for nausea, vomiting, abdominal pain, diarrhea and constipation.  Genitourinary: Negative.  Negative for dysuria, urgency and frequency.  Neurological: Negative.  Negative for dizziness and speech change.  Psychiatric/Behavioral: Negative.    Past Medical History-  Past Medical History  Diagnosis Date  . Meningitis   . Macular degeneration   . Hypertension   . GERD (gastroesophageal reflux disease)   . Hyperlipidemia   . Elevated hemoglobin A1c   . Cataract   . Cancer 1979 mastectomy  . Glaucoma     Right pupil (about 60mm) is larger than left pupil (about 34mm) due to surgery in 1973.  Amblyopia in right eye.   Medications-  Current  Outpatient Prescriptions on File Prior to Visit  Medication Sig Dispense Refill  . ALPHA LIPOIC ACID PO Take 200 mg by mouth daily.      Marland Kitchen amoxicillin (AMOXIL) 500 MG capsule TAKE 4 CAPSULES BY MOUTH 1-2 HOURS BEFORE DENTAL WORK 28 capsule 0  . aspirin EC 81 MG tablet Take 81 mg by mouth daily.      . B Complex-C (B-COMPLEX WITH VITAMIN C) tablet Take 1 tablet by mouth daily.      . bumetanide (BUMEX) 2 MG tablet Take 1 tablet by mouth  daily for blood pressure  and fluid 90 tablet 1  . bumetanide (BUMEX) 2 MG tablet Take 1 mg by mouth as needed (for swelling, edema).    . CHOLECALCIFEROL PO Take 9,000 Units by mouth daily.    Marland Kitchen diltiazem (CARDIZEM) 120 MG tablet Take 120 mg by mouth 2 (two) times daily.    . dorzolamide-timolol (COSOPT) 22.3-6.8 MG/ML ophthalmic solution Place 1 drop into both eyes 2 (two) times daily.  0  . Garlic 0240 MG CAPS Take 1,000 mg by mouth daily.    . Iodine, Kelp, (KELP PO) Take 1 tablet by mouth daily.      . isosorbide mononitrate (IMDUR) 30 MG 24 hr tablet Take 1 tablet by mouth  every night at bedtime 90 tablet 1  . Lactobacillus (ACIDOPHILUS) CAPS capsule Take 1 capsule by mouth daily.    Marland Kitchen latanoprost (XALATAN) 0.005 % ophthalmic solution Place 1 drop into both eyes at bedtime.      Marland Kitchen losartan (COZAAR) 100 MG tablet Take one-half tablet by   mouth twice a day for blood pressure  90 tablet 2  . losartan (COZAAR) 100 MG tablet Take 50 mg by mouth 2 (two) times daily.    . LUTEIN-ZEAXANTHIN PO Take 1 tablet by mouth daily.    . meloxicam (MOBIC) 15 MG tablet Take 1 tablet by mouth  daily with food as needed  for pain 90 tablet 2  . metoprolol (TOPROL-XL) 200 MG 24 hr tablet Take 100 mg by mouth 2 (two) times daily.    . minoxidil (LONITEN) 2.5 MG tablet Take 1 to 2 tablets daily for BP. (Patient taking differently: Take 2.5 mg by mouth daily. Take 1 to 2 tablets daily for BP.) 60 tablet 1  . OVER THE COUNTER MEDICATION Take 1 capsule by mouth every evening.  Herbavision. Hold while in hospital     . tiZANidine (ZANAFLEX) 4 MG tablet 1/2 to 1 tablet 3 x day if needed for muscle spasm if needed (Patient taking differently: Take 4 mg by mouth 3 (three) times daily. ) 90 tablet 0  . traMADol (ULTRAM) 50 MG tablet TAKE 1 TABLET BY MOUTH TWICE DAILY AS NEEDED FOR PAIN 60 tablet 0  . traMADol (ULTRAM) 50 MG tablet Take 50 mg by mouth 4 (four) times daily.    . Vitamins-Lipotropics (B-50 COMPLEX) TABS Take 1 tablet by mouth daily.    . Zinc 50 MG TABS Take 75 mg by mouth daily.     No current facility-administered medications on file prior to visit.   Allergies-  Allergies  Allergen Reactions  . Lactose Intolerance (Gi) Other (See Comments)    unknown   Physical Exam BP 142/80 mmHg  Pulse 60  Temp(Src) 98 F (36.7 C) (Temporal)  Resp 18  Ht 5' 2.5" (1.588 m)  Wt 160 lb (72.576 kg)  BMI 28.78 kg/m2  SpO2 99%  Orthostatic BP- 168/74 lying down, 162/80 sitting, 156/78 standing Wt Readings from Last 3 Encounters:  12/24/14 160 lb (72.576 kg)  12/01/14 161 lb 3.2 oz (73.12 kg)  10/29/14 158 lb (71.668 kg)  Vitals Reviewed. General Appearance: Well nourished, in no apparent distress and had pleasant demeanor. Eyes: Right pupil is larger (about 74mm) than the left pupil (about 76mm) due to Amblyopia. Patient had Glaucoma surgery in 1973 and had bilateral iridectomies.  EOMI. Conjunctiva is pink without edema, erythema or yellowing.  No scleral icterus. Throat: Oral pharynx is pink and moist. No erythema, edema or tenderness in pharynx or posterior pharynx Mucosa is intact and without lesions. Tonsils are at +1 station bilaterally and do not have exudate.    Uvula is midline and not swollen. Neck: Supple,  JVD,  carotid bruits,  LAD,  thyromegaly or thyroid masses.  Trachea is midline.  Full range of motion in neck intact Respiratory: CTAB,  r/r/w or stridor.  No increased effort of breathing. Cardio: RRR.   m/r/g.  S1S2nl.   Abdomen:  Symmetrical, soft, nontender, and rounded.  +BS nl x4.   Extremities:  C/C/E in upper and lower extremities. Pulses B/L +2  Skin: Warm, dry, intact without rashes, lesions, ecchymosis, yellowing, cyanosis. Capillary refill equal bilaterally in hands and feet.   Neuro: Alert and oriented X3, cooperative.  Mood and affect appropriate to situation.  CN II-XII grossly intact.  Patient walks slow steady gait.  Psych: Insight and Judgment appropriate.   Assessment and Plan 1. Essential hypertension Continue medications as prescribed.  Monitor blood pressure at home. Please let us know if above 140/90.  Reminder to go to the ER if any CP, SOB,  nausea, dizziness, severe HA, changes vision/speech, left arm numbness and tingling, and jaw pain - BASIC METABOLIC PANEL WITH GFR  2. Medication management Will monitor kidney and liver function.  - BASIC METABOLIC PANEL WITH GFR  Discussed medication effects and SE's.  Pt agreed to treatment plan. Please keep your follow up appt on 03/10/15.  Bradshaw Minihan, Stephani Police, PA-C 11:44 AM [facility]

## 2014-12-27 ENCOUNTER — Inpatient Hospital Stay (HOSPITAL_COMMUNITY): Payer: Medicare Other

## 2014-12-27 ENCOUNTER — Emergency Department (HOSPITAL_COMMUNITY): Payer: Medicare Other

## 2014-12-27 ENCOUNTER — Encounter (HOSPITAL_COMMUNITY): Payer: Self-pay | Admitting: Physical Medicine and Rehabilitation

## 2014-12-27 ENCOUNTER — Inpatient Hospital Stay (HOSPITAL_COMMUNITY)
Admission: EM | Admit: 2014-12-27 | Discharge: 2014-12-30 | DRG: 884 | Disposition: A | Discharge status: Skilled Nursing Facility | Payer: Medicare Other | Attending: Internal Medicine | Admitting: Internal Medicine

## 2014-12-27 DIAGNOSIS — Z833 Family history of diabetes mellitus: Secondary | ICD-10-CM

## 2014-12-27 DIAGNOSIS — Z8249 Family history of ischemic heart disease and other diseases of the circulatory system: Secondary | ICD-10-CM

## 2014-12-27 DIAGNOSIS — Z90710 Acquired absence of both cervix and uterus: Secondary | ICD-10-CM

## 2014-12-27 DIAGNOSIS — H409 Unspecified glaucoma: Secondary | ICD-10-CM | POA: Diagnosis present

## 2014-12-27 DIAGNOSIS — Z8661 Personal history of infections of the central nervous system: Secondary | ICD-10-CM

## 2014-12-27 DIAGNOSIS — I10 Essential (primary) hypertension: Secondary | ICD-10-CM | POA: Diagnosis present

## 2014-12-27 DIAGNOSIS — Z66 Do not resuscitate: Secondary | ICD-10-CM | POA: Diagnosis present

## 2014-12-27 DIAGNOSIS — E119 Type 2 diabetes mellitus without complications: Secondary | ICD-10-CM | POA: Diagnosis present

## 2014-12-27 DIAGNOSIS — G9340 Encephalopathy, unspecified: Secondary | ICD-10-CM | POA: Diagnosis present

## 2014-12-27 DIAGNOSIS — K219 Gastro-esophageal reflux disease without esophagitis: Secondary | ICD-10-CM | POA: Diagnosis present

## 2014-12-27 DIAGNOSIS — I4891 Unspecified atrial fibrillation: Secondary | ICD-10-CM | POA: Diagnosis present

## 2014-12-27 DIAGNOSIS — E785 Hyperlipidemia, unspecified: Secondary | ICD-10-CM | POA: Diagnosis present

## 2014-12-27 DIAGNOSIS — Z8673 Personal history of transient ischemic attack (TIA), and cerebral infarction without residual deficits: Secondary | ICD-10-CM | POA: Diagnosis not present

## 2014-12-27 DIAGNOSIS — I4892 Unspecified atrial flutter: Secondary | ICD-10-CM | POA: Diagnosis present

## 2014-12-27 DIAGNOSIS — F0390 Unspecified dementia without behavioral disturbance: Secondary | ICD-10-CM | POA: Diagnosis not present

## 2014-12-27 DIAGNOSIS — R197 Diarrhea, unspecified: Secondary | ICD-10-CM

## 2014-12-27 DIAGNOSIS — Z955 Presence of coronary angioplasty implant and graft: Secondary | ICD-10-CM | POA: Diagnosis not present

## 2014-12-27 DIAGNOSIS — Z7982 Long term (current) use of aspirin: Secondary | ICD-10-CM | POA: Diagnosis not present

## 2014-12-27 DIAGNOSIS — R4182 Altered mental status, unspecified: Secondary | ICD-10-CM | POA: Diagnosis present

## 2014-12-27 DIAGNOSIS — Z79899 Other long term (current) drug therapy: Secondary | ICD-10-CM

## 2014-12-27 DIAGNOSIS — Z823 Family history of stroke: Secondary | ICD-10-CM

## 2014-12-27 DIAGNOSIS — I248 Other forms of acute ischemic heart disease: Secondary | ICD-10-CM | POA: Diagnosis present

## 2014-12-27 DIAGNOSIS — I2510 Atherosclerotic heart disease of native coronary artery without angina pectoris: Secondary | ICD-10-CM | POA: Diagnosis present

## 2014-12-27 DIAGNOSIS — H3530 Unspecified macular degeneration: Secondary | ICD-10-CM | POA: Diagnosis present

## 2014-12-27 DIAGNOSIS — E876 Hypokalemia: Secondary | ICD-10-CM | POA: Diagnosis present

## 2014-12-27 LAB — COMPREHENSIVE METABOLIC PANEL
ALK PHOS: 79 U/L (ref 39–117)
ALT: 25 U/L (ref 0–35)
AST: 25 U/L (ref 0–37)
Albumin: 3.6 g/dL (ref 3.5–5.2)
Anion gap: 9 (ref 5–15)
BUN: 11 mg/dL (ref 6–23)
CO2: 26 mmol/L (ref 19–32)
CREATININE: 0.76 mg/dL (ref 0.50–1.10)
Calcium: 9.5 mg/dL (ref 8.4–10.5)
Chloride: 105 mmol/L (ref 96–112)
GFR calc Af Amer: 84 mL/min — ABNORMAL LOW (ref >90)
GFR calc non Af Amer: 72 mL/min — ABNORMAL LOW (ref >90)
Glucose, Bld: 135 mg/dL — ABNORMAL HIGH (ref 70–99)
Potassium: 3.8 mmol/L (ref 3.5–5.1)
SODIUM: 140 mmol/L (ref 135–145)
Total Bilirubin: 0.8 mg/dL (ref 0.3–1.2)
Total Protein: 6.5 g/dL (ref 6.0–8.3)

## 2014-12-27 LAB — CBC WITH DIFFERENTIAL/PLATELET
BASOS ABS: 0 10*3/uL (ref 0.0–0.1)
Basophils Relative: 0 % (ref 0–1)
Eosinophils Absolute: 0 10*3/uL (ref 0.0–0.7)
Eosinophils Relative: 1 % (ref 0–5)
HEMATOCRIT: 39.8 % (ref 36.0–46.0)
Hemoglobin: 13.5 g/dL (ref 12.0–15.0)
LYMPHS ABS: 0.8 10*3/uL (ref 0.7–4.0)
Lymphocytes Relative: 12 % (ref 12–46)
MCH: 31.8 pg (ref 26.0–34.0)
MCHC: 33.9 g/dL (ref 30.0–36.0)
MCV: 93.6 fL (ref 78.0–100.0)
MONO ABS: 0.5 10*3/uL (ref 0.1–1.0)
Monocytes Relative: 7 % (ref 3–12)
NEUTROS PCT: 80 % — AB (ref 43–77)
Neutro Abs: 5.8 10*3/uL (ref 1.7–7.7)
Platelets: 217 10*3/uL (ref 150–400)
RBC: 4.25 MIL/uL (ref 3.87–5.11)
RDW: 13.8 % (ref 11.5–15.5)
WBC: 7.1 10*3/uL (ref 4.0–10.5)

## 2014-12-27 LAB — URINALYSIS, ROUTINE W REFLEX MICROSCOPIC
Bilirubin Urine: NEGATIVE
GLUCOSE, UA: NEGATIVE mg/dL
Ketones, ur: NEGATIVE mg/dL
Leukocytes, UA: NEGATIVE
Nitrite: NEGATIVE
PH: 7 (ref 5.0–8.0)
Protein, ur: NEGATIVE mg/dL
SPECIFIC GRAVITY, URINE: 1.006 (ref 1.005–1.030)
Urobilinogen, UA: 0.2 mg/dL (ref 0.0–1.0)

## 2014-12-27 LAB — CBC
HEMATOCRIT: 43.9 % (ref 36.0–46.0)
HEMOGLOBIN: 14.9 g/dL (ref 12.0–15.0)
MCH: 31.8 pg (ref 26.0–34.0)
MCHC: 33.9 g/dL (ref 30.0–36.0)
MCV: 93.8 fL (ref 78.0–100.0)
PLATELETS: 267 10*3/uL (ref 150–400)
RBC: 4.68 MIL/uL (ref 3.87–5.11)
RDW: 13.8 % (ref 11.5–15.5)
WBC: 7.4 10*3/uL (ref 4.0–10.5)

## 2014-12-27 LAB — CREATININE, SERUM
CREATININE: 0.72 mg/dL (ref 0.50–1.10)
GFR calc non Af Amer: 73 mL/min — ABNORMAL LOW (ref >90)
GFR, EST AFRICAN AMERICAN: 85 mL/min — AB (ref >90)

## 2014-12-27 LAB — VITAMIN B12: VITAMIN B 12: 711 pg/mL (ref 211–911)

## 2014-12-27 LAB — RAPID URINE DRUG SCREEN, HOSP PERFORMED
Amphetamines: NOT DETECTED
BARBITURATES: NOT DETECTED
Benzodiazepines: NOT DETECTED
Cocaine: NOT DETECTED
OPIATES: NOT DETECTED
Tetrahydrocannabinol: NOT DETECTED

## 2014-12-27 LAB — GLUCOSE, CAPILLARY
GLUCOSE-CAPILLARY: 147 mg/dL — AB (ref 70–99)
Glucose-Capillary: 122 mg/dL — ABNORMAL HIGH (ref 70–99)

## 2014-12-27 LAB — TSH: TSH: 1.206 u[IU]/mL (ref 0.350–4.500)

## 2014-12-27 LAB — URINE MICROSCOPIC-ADD ON

## 2014-12-27 LAB — AMMONIA: Ammonia: 16 umol/L (ref 11–32)

## 2014-12-27 LAB — TROPONIN I
Troponin I: 0.04 ng/mL — ABNORMAL HIGH (ref <0.031)
Troponin I: 0.1 ng/mL — ABNORMAL HIGH (ref <0.031)

## 2014-12-27 LAB — MAGNESIUM: Magnesium: 2.3 mg/dL (ref 1.5–2.5)

## 2014-12-27 MED ORDER — ONDANSETRON HCL 4 MG PO TABS: 4.0000 mg | ORAL_TABLET | Freq: Four times a day (QID) | ORAL | Status: DC | PRN

## 2014-12-27 MED ORDER — DORZOLAMIDE HCL-TIMOLOL MAL 2-0.5 % OP SOLN
1.0000 [drp] | Freq: Two times a day (BID) | OPHTHALMIC | Status: DC
  Administered 2014-12-27 – 2014-12-30 (×6): 1 [drp] via OPHTHALMIC
  Filled 2014-12-27: qty 10

## 2014-12-27 MED ORDER — GADOBENATE DIMEGLUMINE 529 MG/ML IV SOLN
15.0000 mL | Freq: Once | INTRAVENOUS | Status: AC | PRN
  Administered 2014-12-27: 15 mL via INTRAVENOUS

## 2014-12-27 MED ORDER — SODIUM CHLORIDE 0.9 % IV SOLN: 250.0000 mL | INTRAVENOUS | Status: DC | PRN

## 2014-12-27 MED ORDER — MINOXIDIL 2.5 MG PO TABS: 2.5000 mg | ORAL_TABLET | Freq: Every day | ORAL | Status: DC

## 2014-12-27 MED ORDER — ASPIRIN EC 81 MG PO TBEC
81.0000 mg | DELAYED_RELEASE_TABLET | Freq: Every day | ORAL | Status: DC
Start: 2014-12-28 — End: 2014-12-30
  Administered 2014-12-28 – 2014-12-30 (×3): 81 mg via ORAL
  Filled 2014-12-27 (×3): qty 1

## 2014-12-27 MED ORDER — BUMETANIDE 2 MG PO TABS: 2.0000 mg | ORAL_TABLET | Freq: Every day | ORAL | Status: DC

## 2014-12-27 MED ORDER — ALUM & MAG HYDROXIDE-SIMETH 200-200-20 MG/5ML PO SUSP: 30.0000 mL | Freq: Four times a day (QID) | ORAL | Status: DC | PRN

## 2014-12-27 MED ORDER — SODIUM CHLORIDE 0.9 % IJ SOLN: 3.0000 mL | INTRAMUSCULAR | Status: DC | PRN

## 2014-12-27 MED ORDER — LOSARTAN POTASSIUM 50 MG PO TABS
50.0000 mg | ORAL_TABLET | Freq: Two times a day (BID) | ORAL | Status: DC
Start: 2014-12-27 — End: 2014-12-27
  Filled 2014-12-27: qty 1

## 2014-12-27 MED ORDER — VITAMIN D3 25 MCG (1000 UNIT) PO TABS
2000.0000 [IU] | ORAL_TABLET | Freq: Every day | ORAL | Status: DC
  Administered 2014-12-28 – 2014-12-30 (×3): 2000 [IU] via ORAL
  Filled 2014-12-27 (×3): qty 2

## 2014-12-27 MED ORDER — ACETAMINOPHEN 325 MG PO TABS: 650.0000 mg | ORAL_TABLET | Freq: Four times a day (QID) | ORAL | Status: DC | PRN

## 2014-12-27 MED ORDER — SODIUM CHLORIDE 0.9 % IJ SOLN
3.0000 mL | Freq: Two times a day (BID) | INTRAMUSCULAR | Status: DC
  Administered 2014-12-29: 3 mL via INTRAVENOUS

## 2014-12-27 MED ORDER — PANTOPRAZOLE SODIUM 40 MG PO TBEC
40.0000 mg | DELAYED_RELEASE_TABLET | Freq: Every day | ORAL | Status: DC
  Administered 2014-12-27 – 2014-12-30 (×4): 40 mg via ORAL
  Filled 2014-12-27 (×5): qty 1

## 2014-12-27 MED ORDER — ENOXAPARIN SODIUM 40 MG/0.4ML ~~LOC~~ SOLN
40.0000 mg | SUBCUTANEOUS | Status: DC
  Administered 2014-12-27 – 2014-12-29 (×3): 40 mg via SUBCUTANEOUS
  Filled 2014-12-27 (×4): qty 0.4

## 2014-12-27 MED ORDER — SENNOSIDES-DOCUSATE SODIUM 8.6-50 MG PO TABS: 1.0000 | ORAL_TABLET | Freq: Every evening | ORAL | Status: DC | PRN

## 2014-12-27 MED ORDER — STROKE: EARLY STAGES OF RECOVERY BOOK
Freq: Once | Status: AC
  Administered 2014-12-27: 15:00:00
  Filled 2014-12-27: qty 1

## 2014-12-27 MED ORDER — SODIUM CHLORIDE 0.9 % IJ SOLN
3.0000 mL | Freq: Two times a day (BID) | INTRAMUSCULAR | Status: DC
  Administered 2014-12-27 – 2014-12-30 (×4): 3 mL via INTRAVENOUS

## 2014-12-27 MED ORDER — ACETAMINOPHEN 650 MG RE SUPP: 650.0000 mg | Freq: Four times a day (QID) | RECTAL | Status: DC | PRN

## 2014-12-27 MED ORDER — SODIUM CHLORIDE 0.9 % IV SOLN
INTRAVENOUS | Status: DC
  Administered 2014-12-27: 16:00:00 via INTRAVENOUS
  Filled 2014-12-27: qty 1000

## 2014-12-27 MED ORDER — ONDANSETRON HCL 4 MG/2ML IJ SOLN: 4.0000 mg | Freq: Four times a day (QID) | INTRAMUSCULAR | Status: DC | PRN

## 2014-12-27 MED ORDER — METOPROLOL SUCCINATE ER 100 MG PO TB24
100.0000 mg | ORAL_TABLET | Freq: Two times a day (BID) | ORAL | Status: DC
  Administered 2014-12-27 – 2014-12-30 (×6): 100 mg via ORAL
  Filled 2014-12-27 (×7): qty 1

## 2014-12-27 MED ORDER — DILTIAZEM HCL 60 MG PO TABS
120.0000 mg | ORAL_TABLET | Freq: Two times a day (BID) | ORAL | Status: DC
  Administered 2014-12-27 – 2014-12-29 (×4): 120 mg via ORAL
  Filled 2014-12-27 (×5): qty 2

## 2014-12-27 MED ORDER — BACID PO TABS
1.0000 | ORAL_TABLET | Freq: Every day | ORAL | Status: DC
  Administered 2014-12-28 – 2014-12-30 (×3): 1 via ORAL
  Filled 2014-12-27 (×4): qty 1

## 2014-12-27 MED ORDER — ISOSORBIDE MONONITRATE ER 30 MG PO TB24
30.0000 mg | ORAL_TABLET | Freq: Every day | ORAL | Status: DC
  Filled 2014-12-27: qty 1

## 2014-12-27 NOTE — ED Notes (Signed)
Patient transported to CT 

## 2014-12-27 NOTE — ED Notes (Signed)
Pt on bedpan at the time.

## 2014-12-27 NOTE — H&P (Addendum)
[facility] History and Physical                                                                                    [name], is a 81 y.o. female  MRN: [id number]   DOB - 02/25/1922  Admit Date - 12/27/2014  Outpatient Primary MD for the patient is [name], MD  With History of -  Past Medical History  Diagnosis Date  . Meningitis   . Macular degeneration   . Hypertension   . GERD (gastroesophageal reflux disease)   . Hyperlipidemia   . Elevated hemoglobin A1c   . Cataract   . Cancer 1979 mastectomy  . Glaucoma     Right pupil (about 23mm) is larger than left pupil (about 61mm) due to surgery in 1973.  Amblyopia in right eye.      Past Surgical History  Procedure Laterality Date  . Abdominal hysterectomy    . Mastectomy    . Coronary stent placement    . Colon resection    . Eye surgery  1973    Dr. Katy Fitch  . Breast surgery Left 1979    mastectomy  . Skin cancer excision  2008     Squamous cell - left thigh    in for   Chief Complaint  Patient presents with  . Altered Mental Status     HPI  [name]  is a 81 y.o. female, with a past medical history of TIA, CAD status post PCI in the 1990s, meningitis, hypertension, hyperlipidemia, and breast cancer. He presents to the emergency department with altered mental status. In reviewing the notes I see that the patient has had difficulty with elevated blood pressure lately and has been seen in the emergency department and by her PCP with blood pressures in the 206/100-180/106 range. The patient tells me her PCP has adjusted her blood pressure medications recently. It is difficult to gather history from [name] as she is having trouble with word finding, and she is mildly confused. The EDP mentioned she was licking the newspaper and spitting on her furniture prior to coming in. She does not have dementia or previous history of such difficulties. The patient clearly tells me is that she has  urinary frequency. She denies any pain or foul odor in her urine. Her granddaughter, [name], stands outside the room crying. She tells me that she is her grandmother40 only caretaker and that she is unable to take her home. [name] will be admitted for altered mental status and stroke rule out.  Review of Systems   I am unable to get a definitive ROS from this pleasantly confused patient, but she denies pain.  She denies SOB.  She denies changes in bowel habits.  She tells me she hasn't been eating well lately because she lives alone.  Then she advises me to talk with her PCP for any further information.  He   Social History History  Substance Use Topics  . Smoking status: Never Smoker   . Smokeless tobacco: Never Used  . Alcohol Use: No    Family History Family History  Problem Relation Age of Onset  .  Hypertension Mother   . Hypertension Father   . CVA Father   . Arthritis Sister     Rheumatoid  . Heart attack Brother   . Heart disease Brother   . Diabetes Daughter   . Hypertension Daughter     Prior to Admission medications   Medication Sig Start Date End Date Taking? Authorizing Provider  ALPHA LIPOIC ACID PO Take 200 mg by mouth daily.      Historical Provider, MD  amoxicillin (AMOXIL) 500 MG capsule TAKE 4 CAPSULES BY MOUTH 1-2 HOURS BEFORE DENTAL WORK 02/09/14   Unk Pinto, MD  aspirin EC 81 MG tablet Take 81 mg by mouth daily.      Historical Provider, MD  B Complex-C (B-COMPLEX WITH VITAMIN C) tablet Take 1 tablet by mouth daily.      Historical Provider, MD  bumetanide (BUMEX) 2 MG tablet Take 1 tablet by mouth  daily for blood pressure  and fluid 10/08/14   Unk Pinto, MD  bumetanide (BUMEX) 2 MG tablet Take 1 mg by mouth as needed (for swelling, edema).    Historical Provider, MD  CHOLECALCIFEROL PO Take 9,000 Units by mouth daily.    Historical Provider, MD  diltiazem (CARDIZEM) 120 MG tablet Take 120 mg by mouth 2 (two) times daily.    Historical  Provider, MD  dorzolamide-timolol (COSOPT) 22.3-6.8 MG/ML ophthalmic solution Place 1 drop into both eyes 2 (two) times daily. 12/03/14   Historical Provider, MD  Garlic 4098 MG CAPS Take 1,000 mg by mouth daily.    Historical Provider, MD  Iodine, Kelp, (KELP PO) Take 1 tablet by mouth daily.      Historical Provider, MD  isosorbide mononitrate (IMDUR) 30 MG 24 hr tablet Take 1 tablet by mouth  every night at bedtime 06/05/14   Unk Pinto, MD  Lactobacillus (ACIDOPHILUS) CAPS capsule Take 1 capsule by mouth daily.    Historical Provider, MD  latanoprost (XALATAN) 0.005 % ophthalmic solution Place 1 drop into both eyes at bedtime.      Historical Provider, MD  losartan (COZAAR) 100 MG tablet Take one-half tablet by   mouth twice a day for blood pressure 08/04/14   Unk Pinto, MD  losartan (COZAAR) 100 MG tablet Take 50 mg by mouth 2 (two) times daily.    Historical Provider, MD  LUTEIN-ZEAXANTHIN PO Take 1 tablet by mouth daily.    Historical Provider, MD  meloxicam (MOBIC) 15 MG tablet Take 1 tablet by mouth  daily with food as needed  for pain 08/04/14   Unk Pinto, MD  metoprolol (TOPROL-XL) 200 MG 24 hr tablet Take 100 mg by mouth 2 (two) times daily.    Historical Provider, MD  minoxidil (LONITEN) 2.5 MG tablet Take 1 to 2 tablets daily for BP. Patient taking differently: Take 2.5 mg by mouth daily. Take 1 to 2 tablets daily for BP. 12/23/14   Unk Pinto, MD  OVER THE COUNTER MEDICATION Take 1 capsule by mouth every evening. Herbavision. Hold while in hospital     Historical Provider, MD  tiZANidine (ZANAFLEX) 4 MG tablet 1/2 to 1 tablet 3 x day if needed for muscle spasm if needed Patient taking differently: Take 4 mg by mouth 3 (three) times daily.  12/05/14 01/05/15  Unk Pinto, MD  traMADol (ULTRAM) 50 MG tablet TAKE 1 TABLET BY MOUTH TWICE DAILY AS NEEDED FOR PAIN 11/28/14   Vicie Mutters, PA-C  traMADol (ULTRAM) 50 MG tablet Take 50 mg by mouth 4 (four) times  daily.     Historical Provider, MD  Vitamins-Lipotropics (B-50 COMPLEX) TABS Take 1 tablet by mouth daily.    Historical Provider, MD  Zinc 50 MG TABS Take 75 mg by mouth daily.    Historical Provider, MD    Allergies  Allergen Reactions  . Lactose Intolerance (Gi) Other (See Comments)    unknown    Physical Exam  Vitals  Blood pressure 176/83, pulse 84, temperature 98.4 F (36.9 C), temperature source Oral, resp. rate 20, SpO2 96 %.   General: Well-developed, well-nourished, elderly female, lying in bed in NAD, granddaughter at bedside (crying)  Psych:  Pleasant, giggling, cooperative, mildly confused. Well-groomed.  Neuro:  Has trouble word finding, and expressing herself.  Strength and sensation appear to be symetric.  CN grossly in tact.  ENT:  Right pupil is enlarged in comparison to left (per family this is old), Conjunctivae clear, PER. Moist oral mucosa without erythema or exudates.  Neck:  Supple, No lymphadenopathy appreciated  Respiratory:  Symmetrical chest wall movement, Good air movement bilaterally, CTAB.  Cardiac:  RRR, No Murmurs, 1+ LE edema noted, no JVD.    Abdomen:  Positive bowel sounds, Soft, Non tender, Non distended,  No masses appreciated  Skin:  No Cyanosis, Normal Skin Turgor, No Skin Rash or Bruise.  Extremities:  Able to move all 4. 5/5 strength in each,  no effusions.  Data Review  CBC  Recent Labs Lab 12/23/14 1949 12/27/14 1003  WBC 8.0 7.1  HGB 13.4 13.5  HCT 40.7 39.8  PLT 214 217  MCV 94.4 93.6  MCH 31.1 31.8  MCHC 32.9 33.9  RDW 13.7 13.8  LYMPHSABS  --  0.8  MONOABS  --  0.5  EOSABS  --  0.0  BASOSABS  --  0.0    Chemistries   Recent Labs Lab 12/23/14 1949 12/24/14 1220 12/27/14 1003  NA 140 140 140  K 3.6 3.7 3.8  CL 106 104 105  CO2 28 28 26   GLUCOSE 135* 88 135*  BUN 19 24* 11  CREATININE 0.91 0.87 0.76  CALCIUM 9.4 9.3 9.5  AST  --   --  25  ALT  --   --  25  ALKPHOS  --   --  79  BILITOT  --   --  0.8     Cardiac Enzymes  Recent Labs Lab 12/27/14 1003  TROPONINI <0.03    Urinalysis    Component Value Date/Time   COLORURINE YELLOW 12/27/2014 1020   APPEARANCEUR CLEAR 12/27/2014 1020   LABSPEC 1.006 12/27/2014 1020   PHURINE 7.0 12/27/2014 1020   GLUCOSEU NEGATIVE 12/27/2014 1020   HGBUR SMALL* 12/27/2014 1020   BILIRUBINUR NEGATIVE 12/27/2014 1020   KETONESUR NEGATIVE 12/27/2014 1020   PROTEINUR NEGATIVE 12/27/2014 1020   UROBILINOGEN 0.2 12/27/2014 1020   NITRITE NEGATIVE 12/27/2014 1020   LEUKOCYTESUR NEGATIVE 12/27/2014 1020    Imaging results:   Dg Chest 2 View  12/27/2014   CLINICAL DATA:  Pt presents to department from home for evaluation of altered mental status. Family states she is "not acting normal." states she has been very confused. History of TIA. Pt denies pain upon arrival to ED. Pt is alert to self, but confused about place and situation  EXAM: CHEST  2 VIEW  COMPARISON:  11/18/2011  FINDINGS: Cardiac silhouette is mildly enlarged. Aorta is mildly uncoiled. No mediastinal or hilar masses.  No lung consolidation or edema. No pleural effusion or pneumothorax.  Status post left  mastectomy, stable.  Bony thorax is demineralized but grossly intact.  IMPRESSION: No acute cardiopulmonary disease.   Electronically Signed   By: Lajean Manes M.D.   On: 12/27/2014 11:48   Ct Head Wo Contrast  12/27/2014   CLINICAL DATA:  Hx of Altered mental status Pt is alert and oriented at time of scanPt denies hx of stroke, seizures, Pt states she fell 2 weeks ago and injured LT Leg  Hx SW:FUXNATFTDDUKGURKYHCW degeneration HTNCA skin, breast Hx CB:[id number] stent placement Hysterectomy  EXAM: CT HEAD WITHOUT CONTRAST  TECHNIQUE: Contiguous axial images were obtained from the base of the skull through the vertex without intravenous contrast.  COMPARISON:  Brain MRI, 08/06/2010 and head CT, 08/05/2010.  FINDINGS: Ventricles normal configuration. There is ventricular and sulcal  enlargement 6 with moderate atrophy. There are no parenchymal masses or mass effect. There is no evidence of a recent transcortical infarct. Few small foci of hypoattenuation are noted in the deep white matter invasive. This suggests a small lacunar infarcts. There is patchy white matter hypoattenuation elsewhere consistent mild chronic microvascular ischemic change.  There are no extra-axial masses or abnormal fluid collections.  There is no intracranial hemorrhage.  Small mucous retention cyst in the left maxillary sinus. Mild ethmoid sinus mucosal thickening. Clear mastoid air cells.  IMPRESSION: 1. No acute intracranial abnormalities. 2. Atrophy and mild chronic microvascular ischemic change. No significant change from the prior studies.   Electronically Signed   By: Lajean Manes M.D.   On: 12/27/2014 12:06    My personal review of EKG: NSR, No ST changes noted..  Later in the day a second EKG showed Aflutter with variable A-V block.   Assessment & Plan  Principal Problem:   Acute encephalopathy Active Problems:   Hypertension   GERD (gastroesophageal reflux disease)   Hyperlipidemia   Glaucoma   Word finding difficulty  Acute encephalopathy Patient has low-grade fever, but no other signs of infection. Her UA is clear, chest x-ray is clear, white count is normal.   I'm unable to gather whether or not she had some underlying signs of dementia previously. B-12, RPR, RBC folate, TSH are pending. She seems to be following most of my commands, but seems to have difficulty with finding words, hence likely acute CVA. CT head is negative, await MRI brain, and neurology evaluation.   Afib with RVR Initial EKG showed sinus rhythm, upon arriving to the floor, patient was noted to be tachycardic- an EKG showed Aflutter with variable A-V block.CHA2DS2-VASc of at least 5, suspect will need initiation of anticoagulation, we will defer initiation timing to cardiology/neurology Cardiology was consulted  (patient has previously seen [place])  HTN Will allow permissive HTN due to suspected stroke. Will hold diuretics, ARB, IMDUR, minoxidil.  Continue cardizem and Toprol in light of  fibrillation/flutter with RVR   Glaucoma Continue eye drops.  GERD PPI   DVT Prophylaxis: Lovenox  AM Labs Ordered, also please review Full Orders  Family Communication:   Spoke with granddaughter and primary caretaker, [name] [phone]) Will likely discharge patient to skilled nursing facility.  Code Status:  DO NOT RESUSCITATE  Condition:  Guarded  Time spent in minutes : 60   York, Bobby Rumpf PA-C on 12/27/2014 at 2:41 PM  Between 7am to 7pm - Pager - [phone]  After 7pm go to [email or web address] - password [email or web address]  And look for the night coverage person covering me after hours  [facility]  Attending Patient was seen, examined,treatment plan was discussed  with the  Advance Practice Provider.  I have directly reviewed the clinical findings, lab, imaging studies and management of this patient in detail. I have made the necessary changes to the above noted documentation, and agree with the documentation, as recorded by the Advance Practice Provider.   Very pleasant 81 year old female brought in for confusion-but on further evaluation seems to be following commands, but has some dysarthria and mostly word finding difficulty. Suspect acute CVA, not known when last seen normal at this time as patient lives alone. Initially found to be in sinus rhythm, however when transferred upstairs, found to be in atrial flutter. Will need initiation of anticoagulation, we'll defer timing to both neurology and cardiology. We will attempt rate control with both Cardizem and metoprolol. Await further input from cardiology and neurology, monitor in telemetry.  Nena Alexander MD [facility].

## 2014-12-27 NOTE — Progress Notes (Signed)
Patient trasfered from ED to 5W25 via stretcher; alert and oriented x 4; no complaints of pain; IV saline locked in [place]; skin intact. Orient patient to room and unit; watch safety video; gave patient care guide; instructed how to use the call bell and  fall risk precautions. Will continue to monitor the patient.

## 2014-12-27 NOTE — Consult Note (Signed)
CARDIOLOGY CONSULT NOTE  Patient ID: KIERRAH Wang  MRN: [id number]  DOB: May 22, 1922  Admit date: 12/27/2014 Date of Consult: 12/27/2014  Primary Physician: [name], MD  Reason for Consultation: Atrial flutter  History of Present Illness: [name] is a 81 y.o. female with a medical history of CAD s/p PCI in the 90s, hypertension, TIA, meningitis, who presented earlier today with altered mental status.  See H&P from earlier today for full details of her presentation.  We are being consulted for new onset atrial flutter.  The onset was around 4p when she was stood up to check orthostatic vitals and became tachycardic, but the tachycardia persisted and she was in atrial flutter on telemetry.  She is DNR.  On my history she is chest pain free, without SOB.  Confusion is improved.   Past Medical History  Diagnosis Date  . Meningitis   . Macular degeneration   . Hypertension   . GERD (gastroesophageal reflux disease)   . Hyperlipidemia   . Elevated hemoglobin A1c   . Cataract   . Cancer 1979 mastectomy  . Glaucoma     Right pupil (about 68mm) is larger than left pupil (about 1mm) due to surgery in 1973.  Amblyopia in right eye.    Past Surgical History  Procedure Laterality Date  . Abdominal hysterectomy    . Mastectomy    . Coronary stent placement    . Colon resection    . Eye surgery  1973    Dr. Katy Fitch  . Breast surgery Left 1979    mastectomy  . Skin cancer excision  2008     Squamous cell - left thigh     Home Meds: Prior to Admission medications   Medication Sig Start Date End Date Taking? Authorizing Provider  ALPHA LIPOIC ACID PO Take 200 mg by mouth daily.      Historical Provider, MD  amoxicillin (AMOXIL) 500 MG capsule TAKE 4 CAPSULES BY MOUTH 1-2 HOURS BEFORE DENTAL WORK 02/09/14   Unk Pinto, MD  aspirin EC 81 MG tablet Take 81 mg by mouth daily.      Historical Provider, MD  B Complex-C (B-COMPLEX WITH VITAMIN C) tablet Take 1  tablet by mouth daily.      Historical Provider, MD  bumetanide (BUMEX) 2 MG tablet Take 1 tablet by mouth  daily for blood pressure  and fluid 10/08/14   Unk Pinto, MD  bumetanide (BUMEX) 2 MG tablet Take 1 mg by mouth as needed (for swelling, edema).    Historical Provider, MD  CHOLECALCIFEROL PO Take 9,000 Units by mouth daily.    Historical Provider, MD  diltiazem (CARDIZEM) 120 MG tablet Take 120 mg by mouth 2 (two) times daily.    Historical Provider, MD  dorzolamide-timolol (COSOPT) 22.3-6.8 MG/ML ophthalmic solution Place 1 drop into both eyes 2 (two) times daily. 12/03/14   Historical Provider, MD  Garlic 0630 MG CAPS Take 1,000 mg by mouth daily.    Historical Provider, MD  Iodine, Kelp, (KELP PO) Take 1 tablet by mouth daily.      Historical Provider, MD  isosorbide mononitrate (IMDUR) 30 MG 24 hr tablet Take 1 tablet by mouth  every night at bedtime 06/05/14   Unk Pinto, MD  Lactobacillus (ACIDOPHILUS) CAPS capsule Take 1 capsule by mouth daily.    Historical Provider, MD  latanoprost (XALATAN) 0.005 % ophthalmic solution Place 1 drop into both eyes at bedtime.      Historical Provider,  MD  losartan (COZAAR) 100 MG tablet Take one-half tablet by   mouth twice a day for blood pressure 08/04/14   Unk Pinto, MD  losartan (COZAAR) 100 MG tablet Take 50 mg by mouth 2 (two) times daily.    Historical Provider, MD  LUTEIN-ZEAXANTHIN PO Take 1 tablet by mouth daily.    Historical Provider, MD  meloxicam (MOBIC) 15 MG tablet Take 1 tablet by mouth  daily with food as needed  for pain 08/04/14   Unk Pinto, MD  metoprolol (TOPROL-XL) 200 MG 24 hr tablet Take 100 mg by mouth 2 (two) times daily.    Historical Provider, MD  minoxidil (LONITEN) 2.5 MG tablet Take 1 to 2 tablets daily for BP. Patient taking differently: Take 2.5 mg by mouth daily. Take 1 to 2 tablets daily for BP. 12/23/14   Unk Pinto, MD  OVER THE COUNTER MEDICATION Take 1 capsule by mouth every evening.  Herbavision. Hold while in hospital     Historical Provider, MD  tiZANidine (ZANAFLEX) 4 MG tablet 1/2 to 1 tablet 3 x day if needed for muscle spasm if needed Patient taking differently: Take 4 mg by mouth 3 (three) times daily.  12/05/14 01/05/15  Unk Pinto, MD  traMADol (ULTRAM) 50 MG tablet TAKE 1 TABLET BY MOUTH TWICE DAILY AS NEEDED FOR PAIN 11/28/14   Vicie Mutters, PA-C  traMADol (ULTRAM) 50 MG tablet Take 50 mg by mouth 4 (four) times daily.    Historical Provider, MD  Vitamins-Lipotropics (B-50 COMPLEX) TABS Take 1 tablet by mouth daily.    Historical Provider, MD  Zinc 50 MG TABS Take 75 mg by mouth daily.    Historical Provider, MD    Current Medications: . [START ON 12/28/2014] aspirin EC  81 mg Oral Daily  . [START ON 12/28/2014] cholecalciferol  2,000 Units Oral Daily  . diltiazem  120 mg Oral BID  . dorzolamide-timolol  1 drop Both Eyes BID  . enoxaparin (LOVENOX) injection  40 mg Subcutaneous Q24H  . [START ON 12/28/2014] lactobacillus acidophilus  1 tablet Oral Daily  . metoprolol  100 mg Oral BID  . pantoprazole  40 mg Oral Daily  . sodium chloride  3 mL Intravenous Q12H  . sodium chloride  3 mL Intravenous Q12H     Allergies:    Allergies  Allergen Reactions  . Lactose Intolerance (Gi) Other (See Comments)    unknown    Social History:   The patient  reports that she has never smoked. She has never used smokeless tobacco. She reports that she does not drink alcohol.    Family History:   The patient's family history includes Arthritis in her sister; CVA in her father; Diabetes in her daughter; Heart attack in her brother; Heart disease in her brother; Hypertension in her daughter, father, and mother.   ROS:  Please see the history of present illness.  All other systems reviewed and negative.   Vital Signs: Blood pressure 97/76, pulse 118, temperature 98.4 F (36.9 C), temperature source Oral, resp. rate 20, SpO2 95 %.   PHYSICAL EXAM: General:  Well nourished,  well developed, in no acute distress HEENT: normal Lymph: no adenopathy Neck: no JVD Endocrine:  No thryomegaly Cardiac:  normal S1, S2; RRR; no murmur Lungs:  clear to auscultation bilaterally, no wheezing, rhonchi or rales Abd: soft, nontender, no hepatomegaly Ext: no edema Musculoskeletal:  No deformities, BUE and BLE strength normal and equal Skin: warm and dry Neuro:  CNs 2-12 intact,  no focal abnormalities noted Psych:  Normal affect   EKG:  1626 ECG shows atypical atrial flutter with variable block, rate of 130.  47mm ST depression noted lateral precordial leads.  Labs:  Recent Labs  12/27/14 1003 12/27/14 1556  TROPONINI <0.03 0.04*   Lab Results  Component Value Date   WBC 7.4 12/27/2014   HGB 14.9 12/27/2014   HCT 43.9 12/27/2014   MCV 93.8 12/27/2014   PLT 267 12/27/2014    Recent Labs Lab 12/27/14 1003 12/27/14 1556  NA 140  --   K 3.8  --   CL 105  --   CO2 26  --   BUN 11  --   CREATININE 0.76 0.72  CALCIUM 9.5  --   PROT 6.5  --   BILITOT 0.8  --   ALKPHOS 79  --   ALT 25  --   AST 25  --   GLUCOSE 135*  --    Lab Results  Component Value Date   CHOL 125 12/01/2014   HDL 44 12/01/2014   LDLCALC 53 12/01/2014   TRIG 139 12/01/2014   No results found for: DDIMER  Radiology/Studies:  Dg Chest 2 View  12/27/2014   CLINICAL DATA:  Pt presents to department from home for evaluation of altered mental status. Family states she is "not acting normal." states she has been very confused. History of TIA. Pt denies pain upon arrival to ED. Pt is alert to self, but confused about place and situation  EXAM: CHEST  2 VIEW  COMPARISON:  11/18/2011  FINDINGS: Cardiac silhouette is mildly enlarged. Aorta is mildly uncoiled. No mediastinal or hilar masses.  No lung consolidation or edema. No pleural effusion or pneumothorax.  Status post left mastectomy, stable.  Bony thorax is demineralized but grossly intact.  IMPRESSION: No acute cardiopulmonary disease.    Electronically Signed   By: Lajean Manes M.D.   On: 12/27/2014 11:48   Ct Head Wo Contrast  12/27/2014   CLINICAL DATA:  Hx of Altered mental status Pt is alert and oriented at time of scanPt denies hx of stroke, seizures, Pt states she fell 2 weeks ago and injured LT Leg  Hx JJ:HERDEYCXKGYJEHUDJSHF degeneration HTNCA skin, breast Hx W[place]:OVZCHYIFOYDXA stent placement Hysterectomy  EXAM: CT HEAD WITHOUT CONTRAST  TECHNIQUE: Contiguous axial images were obtained from the base of the skull through the vertex without intravenous contrast.  COMPARISON:  Brain MRI, 08/06/2010 and head CT, 08/05/2010.  FINDINGS: Ventricles normal configuration. There is ventricular and sulcal enlargement 6 with moderate atrophy. There are no parenchymal masses or mass effect. There is no evidence of a recent transcortical infarct. Few small foci of hypoattenuation are noted in the deep white matter invasive. This suggests a small lacunar infarcts. There is patchy white matter hypoattenuation elsewhere consistent mild chronic microvascular ischemic change.  There are no extra-axial masses or abnormal fluid collections.  There is no intracranial hemorrhage.  Small mucous retention cyst in the left maxillary sinus. Mild ethmoid sinus mucosal thickening. Clear mastoid air cells.  IMPRESSION: 1. No acute intracranial abnormalities. 2. Atrophy and mild chronic microvascular ischemic change. No significant change from the prior studies.   Electronically Signed   By: Lajean Manes M.D.   On: 12/27/2014 12:06   Echo from 2009 with normal LVEF, no significant valvular disease.  ASSESSMENT AND PLAN:  1. Atrial Flutter - Converted back to sinus on my exam and on telemetry.  - Already on significant AV nodal blockade (  metoprolol XL 100mg  BID and diltiazem 120mg  BID).  Continue these for now. - Could consider amiodarone if flutter becomes a recurrent issue. - TTE already ordered for tomorrow as part of stroke workup. - If she is found to  have had an embolic stroke on cardiac MRI, given her age, could consider using lower dose apixaban (2.5mg  BID) for anticoagulation.   - Continue to trend troponin.  Start anticoagulation with fondaparinux 2.5mg  daily. Continue aspirin 81mg . Prior PCI but likely just medical management here.   Cleotis Lema 12/27/2014 5:54 PM

## 2014-12-27 NOTE — ED Notes (Signed)
Pt presents to department via [facility] from home for evaluation of altered mental status. Family states she is "not acting normal." states she has been very confused. History of TIA. Pt denies pain upon arrival to ED. Pt is alert to self, but confused about place and situation. CBG 133, 20g L hand.

## 2014-12-27 NOTE — Consult Note (Signed)
Neurology Consultation Reason for Consult: Altered mental status Referring Physician: Tera Helper, S  CC: Altered mental status  History is obtained from: Patient  HPI: [name] is a 81 y.o. female who presents after having been noted by family to be not herself. She states that she just didn't feel right in the head earlier today. She does not have any new deficits that she is aware of. She does not have a known history of dementia. She apparently was feeling "lousy." She has been acting out of sorts and leaving things around the house which she would not normally do.   ROS: A 14 point ROS was performed and is negative except as noted in the HPI.   Past Medical History  Diagnosis Date  . Meningitis   . Macular degeneration   . Hypertension   . GERD (gastroesophageal reflux disease)   . Hyperlipidemia   . Elevated hemoglobin A1c   . Cataract   . Cancer 1979 mastectomy  . Glaucoma     Right pupil (about 57mm) is larger than left pupil (about 47mm) due to surgery in 1973.  Amblyopia in right eye.    Family History: No history of similar  Social History: Lives with granddaughter  Exam: Current vital signs: BP 97/76 mmHg  Pulse 118  Temp(Src) 98.4 F (36.9 C) (Oral)  Resp 20  SpO2 95% Vital signs in last 24 hours: Temp:  [98.4 F (36.9 C)-99.9 F (37.7 C)] 98.4 F (36.9 C) (01/31 1415) Pulse Rate:  [79-137] 118 (01/31 1633) Resp:  [19-23] 20 (01/31 1415) BP: (97-188)/(73-100) 97/76 mmHg (01/31 1545) SpO2:  [95 %-100 %] 95 % (01/31 1545)   Physical Exam  Constitutional: Appears elderly Psych: Affect appropriate to situation Eyes: No scleral injection HENT: No OP obstrucion Head: Normocephalic.  Cardiovascular: Normal rate and regular rhythm.  Respiratory: Effort normal and breath sounds normal to anterior ascultation GI: Soft.  No distension. There is no tenderness.  Skin: WDI  Neuro: Mental Status: Patient is awake, alert, oriented to person only. 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 Pupils are equal, round, and reactive to light.   III,IV, VI: EOMI without ptosis or diploplia.  V: Facial sensation is symmetric to temperature VII: Facial movement is symmetric.  VIII: hearing is intact to voice X: Uvula elevates symmetrically XI: Shoulder shrug is symmetric. XII: tongue is midline without atrophy or fasciculations.  Motor: Tone is normal. Bulk is normal. 5/5 strength was present in all four extremities.  Sensory: Sensation is symmetric to light touch and temperature in the arms and legs. Deep Tendon Reflexes: 2+ and symmetric in the biceps and patellae.  Plantars: Toes are downgoing bilaterally.  Cerebellar: FNF and HKS are intact bilaterally   I have reviewed labs in epic and the results pertinent to this consultation are:  UDS-negative I have reviewed the images obtained: CT head-negative  Impression: 81 year old female with mild pleasant confusion. I suspect that she might have some underlying dementia with a mild worsening. There is no clear source of infection though would be responsible, but she does report feeling "lousy." It is unclear to me at this time what exactly is causing her worsening but further workup is indicated.  Recommendations: 1) ammonia, TSH 2) MRI brain 3) neurology will continue to follow   Roland Rack, MD [facility] [phone]  If 7pm-  7am, please page neurology on call as listed in [place].

## 2014-12-27 NOTE — Evaluation (Signed)
Physical Therapy Evaluation Patient Details Name: [name] MRN: [id number] DOB: [birth year] Today's Date: 12/27/2014   History of Present Illness  Pt presents from home with AMS. PMH: glaucoma left eye, breast cancer, HTN, meningitis, TIA  Clinical Impression  Pt admitted with above diagnosis. Pt currently with functional limitations due to the deficits listed below (see PT Problem List). Pt confused on eval and having difficulty problem solving and following commands which is decreasing her safety with mobility. In addition, pt orthostatic (see vitals below) with mild dizziness. Pt will benefit from skilled PT to increase their independence and safety with mobility to allow discharge to the venue listed below.       Follow Up Recommendations SNF;Supervision/Assistance - 24 hour    Equipment Recommendations  None recommended by PT    Recommendations for Other Services OT consult , SLP consult    Precautions / Restrictions Precautions Precautions: Fall Precaution Comments: pt fell last fall as well as 2 wks ago, left leg still sore from most recent fall at Dr [facility] office Restrictions Weight Bearing Restrictions: No Other Position/Activity Restrictions: blindness left eye      Mobility  Bed Mobility Overal bed mobility: Needs Assistance Bed Mobility: Supine to Sit;Sit to Supine     Supine to sit: Min assist Sit to supine: Min assist   General bed mobility comments: pt initiates sitting but needed min A to achieve full upright. Pt able to return to supine with supervision but was unable to problem solve scooting up in bed and required +2 max A to get to [facility]  Transfers Overall transfer level: Needs assistance Equipment used: Rolling walker (2 wheeled);1 person hand held assist Transfers: Sit to/from [facility] Sit to Stand: Min assist Stand pivot transfers: Min assist       General transfer comment: min A for power up and to steady with pivot  transfer. Pt unsafe with RW, picking it up off floor with pivot. Pt safer today with HHA. Bilateral knees maintained in flexion and pt fatigued quickly in standing for perineal care.  Ambulation/Gait             General Gait Details: NT due to pt to go to MRI  Stairs            Wheelchair Mobility    Modified Rankin (Stroke Patients Only)       Balance Overall balance assessment: Needs assistance Sitting-balance support: No upper extremity supported;Feet supported Sitting balance-Leahy Scale: Fair     Standing balance support: Bilateral upper extremity supported;During functional activity Standing balance-Leahy Scale: Poor Standing balance comment: pt stand with trunk flexion, needs UE support to prevent fall today                             Pertinent Vitals/Pain Pain Assessment: No/denies pain  BP supine 155/89, sitting 139/95, Standing 97/76    Home Living Family/patient expects to be discharged to:: Private residence Living Arrangements: Alone Available Help at Discharge: Family;Available PRN/intermittently;Neighbor Type of Home: House Home Access: Stairs to enter Entrance Stairs-Rails: Right Entrance Stairs-Number of Steps: 3 Home Layout: One level Home Equipment: [place] - 2 wheels;Bedside commode Additional Comments: pt's neightbor often brings her food. Her grandaughter and grandson check on her regularly as well. Pt does not drive    Prior Function Level of Independence: Independent with assistive device(s)         Comments: pt confused today so difficult to get  accurate history but she reports that she rarely uses her RW within her home except first thing in the morning and uses it regularly out of her home.      Hand Dominance        Extremity/Trunk Assessment   Upper Extremity Assessment: Overall WFL for tasks assessed           Lower Extremity Assessment: RLE deficits/detail;LLE deficits/detail;Difficult to assess due  to impaired cognition RLE Deficits / Details: hip flex 3+/5, knee ext 3+/5, difficult to assess fully because pt had trouble following directional cues LLE Deficits / Details: hip flex 3/5, knee ext/ flex 3/5, weaker than left with these mvmts but this is the leg that has been sore since she fell the other day. With hip abd/ add, pt equal right and left  Cervical / Trunk Assessment: Kyphotic  Communication   Communication: Expressive difficulties  Cognition Arousal/Alertness: Awake/alert Behavior During Therapy: WFL for tasks assessed/performed Overall Cognitive Status: Impaired/Different from baseline Area of Impairment: Problem solving;Memory;Safety/judgement     Memory: Decreased short-term memory Following Commands: Follows multi-step commands inconsistently;Follows multi-step commands with increased time;Follows one step commands with increased time Safety/Judgement: Decreased awareness of deficits;Decreased awareness of safety   Problem Solving: Difficulty sequencing;Requires verbal cues;Requires tactile cues General Comments: pt having word finding difficulties as well as jumbling words up as she says them. She is somewhat aware of this and comments that she is just not herself and that her brain is just not right today. Her timeline is hard to follow and she has some trouble following multistep commands. With neurologist, could spell "world" but not spell it bkwds, and could tell that there are 4 quarters in a dollar but not how many there would be in $2.75.    General Comments      Exercises        Assessment/Plan    PT Assessment Patient needs continued PT services  PT Diagnosis Difficulty walking;Abnormality of gait;Generalized weakness;Altered mental status   PT Problem List Decreased strength;Decreased activity tolerance;Decreased balance;Decreased mobility;Decreased coordination;Decreased cognition;Decreased knowledge of precautions;Decreased safety awareness  PT  Treatment Interventions DME instruction;Gait training;Functional mobility training;Therapeutic activities;Therapeutic exercise;Balance training;Patient/family education;Cognitive remediation;Neuromuscular re-education   PT Goals (Current goals can be found in the Care Plan section) Acute Rehab PT Goals Patient Stated Goal: I'll do what I need to get better PT Goal Formulation: With patient Time For Goal Achievement: 01/10/15 Potential to Achieve Goals: Good    Frequency Min 3X/week   Barriers to discharge Decreased caregiver support pt lives alone    Co-evaluation               End of Session Equipment Utilized During Treatment: Gait belt Activity Tolerance: Patient tolerated treatment well Patient left: in bed;with bed alarm set;with call bell/phone within reach Nurse Communication: Mobility status         Time: 1530-1605 PT Time Calculation (min) (ACUTE ONLY): 35 min   Charges:   PT Evaluation $Initial PT Evaluation Tier I: 1 Procedure PT Treatments $Therapeutic Activity: 8-22 mins   PT G Codes:      Leighton Roach, PT  Acute Rehab Services  [place], [place] 12/27/2014, 4:13 PM

## 2014-12-27 NOTE — ED Provider Notes (Signed)
CSN: [id number]     Arrival date & time 12/27/14  0930 History   First MD Initiated Contact with Patient 12/27/14 [phone]     Chief Complaint  Patient presents with  . Altered Mental Status     Patient is a 81 y.o. female presenting with altered mental status. The history is provided by the patient, the EMS personnel and a relative. No language interpreter was used.  Altered Mental Status  [name] is here for evaluation of altered mental status. Level V Caveat due to confusion. Per EMS and family report patient has been confused and not acting like herself since 11:00 last night. They report that she has been leaving things around the house and sitting on the furniture which is very unusual for her. Patient reports feeling "lousy" but has no other complaints. She denies any headaches, chest pain, abdominal pain, vomiting, cough, dysuria. She was seen in the emergency department 2 days ago for evaluation of elevated blood pressures.  Past Medical History  Diagnosis Date  . Meningitis   . Macular degeneration   . Hypertension   . GERD (gastroesophageal reflux disease)   . Hyperlipidemia   . Elevated hemoglobin A1c   . Cataract   . Cancer 1979 mastectomy  . Glaucoma     Right pupil (about 34mm) is larger than left pupil (about 41mm) due to surgery in 1973.  Amblyopia in right eye.   Past Surgical History  Procedure Laterality Date  . Abdominal hysterectomy    . Mastectomy    . Coronary stent placement    . Colon resection    . Eye surgery  1973    Dr. Katy Fitch  . Breast surgery Left 1979    mastectomy  . Skin cancer excision  2008     Squamous cell - left thigh   Family History  Problem Relation Age of Onset  . Hypertension Mother   . Hypertension Father   . CVA Father   . Arthritis Sister     Rheumatoid  . Heart attack Brother   . Heart disease Brother   . Diabetes Daughter   . Hypertension Daughter    History  Substance Use Topics  . Smoking status: Never Smoker    . Smokeless tobacco: Not on file  . Alcohol Use: No   OB History    No data available     Review of Systems  All other systems reviewed and are negative.     Allergies  Lactose intolerance (gi)  Home Medications   Prior to Admission medications   Medication Sig Start Date End Date Taking? Authorizing Provider  ALPHA LIPOIC ACID PO Take 200 mg by mouth daily.      Historical Provider, MD  amoxicillin (AMOXIL) 500 MG capsule TAKE 4 CAPSULES BY MOUTH 1-2 HOURS BEFORE DENTAL WORK 02/09/14   Unk Pinto, MD  aspirin EC 81 MG tablet Take 81 mg by mouth daily.      Historical Provider, MD  B Complex-C (B-COMPLEX WITH VITAMIN C) tablet Take 1 tablet by mouth daily.      Historical Provider, MD  bumetanide (BUMEX) 2 MG tablet Take 1 tablet by mouth  daily for blood pressure  and fluid 10/08/14   Unk Pinto, MD  bumetanide (BUMEX) 2 MG tablet Take 1 mg by mouth as needed (for swelling, edema).    Historical Provider, MD  CHOLECALCIFEROL PO Take 9,000 Units by mouth daily.    Historical Provider, MD  diltiazem (CARDIZEM) 120 MG  tablet Take 120 mg by mouth 2 (two) times daily.    Historical Provider, MD  dorzolamide-timolol (COSOPT) 22.3-6.8 MG/ML ophthalmic solution Place 1 drop into both eyes 2 (two) times daily. 12/03/14   Historical Provider, MD  Garlic 0867 MG CAPS Take 1,000 mg by mouth daily.    Historical Provider, MD  Iodine, Kelp, (KELP PO) Take 1 tablet by mouth daily.      Historical Provider, MD  isosorbide mononitrate (IMDUR) 30 MG 24 hr tablet Take 1 tablet by mouth  every night at bedtime 06/05/14   Unk Pinto, MD  Lactobacillus (ACIDOPHILUS) CAPS capsule Take 1 capsule by mouth daily.    Historical Provider, MD  latanoprost (XALATAN) 0.005 % ophthalmic solution Place 1 drop into both eyes at bedtime.      Historical Provider, MD  losartan (COZAAR) 100 MG tablet Take one-half tablet by   mouth twice a day for blood pressure 08/04/14   Unk Pinto, MD  losartan  (COZAAR) 100 MG tablet Take 50 mg by mouth 2 (two) times daily.    Historical Provider, MD  LUTEIN-ZEAXANTHIN PO Take 1 tablet by mouth daily.    Historical Provider, MD  meloxicam (MOBIC) 15 MG tablet Take 1 tablet by mouth  daily with food as needed  for pain 08/04/14   Unk Pinto, MD  metoprolol (TOPROL-XL) 200 MG 24 hr tablet Take 100 mg by mouth 2 (two) times daily.    Historical Provider, MD  minoxidil (LONITEN) 2.5 MG tablet Take 1 to 2 tablets daily for BP. Patient taking differently: Take 2.5 mg by mouth daily. Take 1 to 2 tablets daily for BP. 12/23/14   Unk Pinto, MD  OVER THE COUNTER MEDICATION Take 1 capsule by mouth every evening. Herbavision. Hold while in hospital     Historical Provider, MD  tiZANidine (ZANAFLEX) 4 MG tablet 1/2 to 1 tablet 3 x day if needed for muscle spasm if needed Patient taking differently: Take 4 mg by mouth 3 (three) times daily.  12/05/14 01/05/15  Unk Pinto, MD  traMADol (ULTRAM) 50 MG tablet TAKE 1 TABLET BY MOUTH TWICE DAILY AS NEEDED FOR PAIN 11/28/14   Vicie Mutters, PA-C  traMADol (ULTRAM) 50 MG tablet Take 50 mg by mouth 4 (four) times daily.    Historical Provider, MD  Vitamins-Lipotropics (B-50 COMPLEX) TABS Take 1 tablet by mouth daily.    Historical Provider, MD  Zinc 50 MG TABS Take 75 mg by mouth daily.    Historical Provider, MD   BP 188/80 mmHg  Temp(Src) 99.2 F (37.3 C) (Oral)  Resp 21  SpO2 100% Physical Exam  Constitutional: She appears well-developed and well-nourished.  HENT:  Head: Normocephalic and atraumatic.  Eyes:  Right pupil dilated and irregular, left pupil 2-3 mm and reactive  Cardiovascular: Normal rate and regular rhythm.   No murmur heard. Pulmonary/Chest: Effort normal and breath sounds normal. No respiratory distress.  Abdominal: Soft. There is no tenderness. There is no rebound and no guarding.  Musculoskeletal: She exhibits no edema or tenderness.  Neurological: She is alert.  Pleasantly confused  and disoriented to time MAE symmetrically and strongly  Skin: Skin is warm and dry.  Psychiatric: She has a normal mood and affect. Her behavior is normal.  Nursing note and vitals reviewed.   ED Course  Procedures (including critical care time) Labs Review Labs Reviewed  COMPREHENSIVE METABOLIC PANEL - Abnormal; Notable for the following:    Glucose, Bld 135 (*)    GFR calc  non Af Amer 72 (*)    GFR calc Af Amer 84 (*)    All other components within normal limits  CBC WITH DIFFERENTIAL/PLATELET - Abnormal; Notable for the following:    Neutrophils Relative % 80 (*)    All other components within normal limits  URINALYSIS, ROUTINE W REFLEX MICROSCOPIC - Abnormal; Notable for the following:    Hgb urine dipstick SMALL (*)    All other components within normal limits  GLUCOSE, CAPILLARY - Abnormal; Notable for the following:    Glucose-Capillary 147 (*)    All other components within normal limits  URINE CULTURE  TROPONIN I  URINE MICROSCOPIC-ADD ON  CBC  URINE RAPID DRUG SCREEN (HOSP PERFORMED)  CREATININE, SERUM  TSH  TROPONIN I  TROPONIN I  TROPONIN I  VITAMIN B12  RPR  FOLATE RBC  HEMOGLOBIN A1C  COMPREHENSIVE METABOLIC PANEL  CBC  LIPID PANEL    Imaging Review Dg Chest 2 View  12/27/2014   CLINICAL DATA:  Pt presents to department from home for evaluation of altered mental status. Family states she is "not acting normal." states she has been very confused. History of TIA. Pt denies pain upon arrival to ED. Pt is alert to self, but confused about place and situation  EXAM: CHEST  2 VIEW  COMPARISON:  11/18/2011  FINDINGS: Cardiac silhouette is mildly enlarged. Aorta is mildly uncoiled. No mediastinal or hilar masses.  No lung consolidation or edema. No pleural effusion or pneumothorax.  Status post left mastectomy, stable.  Bony thorax is demineralized but grossly intact.  IMPRESSION: No acute cardiopulmonary disease.   Electronically Signed   By: Lajean Manes M.D.    On: 12/27/2014 11:48   Ct Head Wo Contrast  12/27/2014   CLINICAL DATA:  Hx of Altered mental status Pt is alert and oriented at time of scanPt denies hx of stroke, seizures, Pt states she fell 2 weeks ago and injured LT Leg  Hx TK:[id number] degeneration HTNCA skin, breast Hx HC:[id number] stent placement Hysterectomy  EXAM: CT HEAD WITHOUT CONTRAST  TECHNIQUE: Contiguous axial images were obtained from the base of the skull through the vertex without intravenous contrast.  COMPARISON:  Brain MRI, 08/06/2010 and head CT, 08/05/2010.  FINDINGS: Ventricles normal configuration. There is ventricular and sulcal enlargement 6 with moderate atrophy. There are no parenchymal masses or mass effect. There is no evidence of a recent transcortical infarct. Few small foci of hypoattenuation are noted in the deep white matter invasive. This suggests a small lacunar infarcts. There is patchy white matter hypoattenuation elsewhere consistent mild chronic microvascular ischemic change.  There are no extra-axial masses or abnormal fluid collections.  There is no intracranial hemorrhage.  Small mucous retention cyst in the left maxillary sinus. Mild ethmoid sinus mucosal thickening. Clear mastoid air cells.  IMPRESSION: 1. No acute intracranial abnormalities. 2. Atrophy and mild chronic microvascular ischemic change. No significant change from the prior studies.   Electronically Signed   By: Lajean Manes M.D.   On: 12/27/2014 12:06     EKG Interpretation   Date/Time:  Sunday December 27 2014 09:42:53 EST Ventricular Rate:  77 PR Interval:  141 QRS Duration: 88 QT Interval:  395 QTC Calculation: 447 R Axis:   91 Text Interpretation:  Sinus rhythm Right axis deviation Confirmed by Hazle Coca [phone]) on 12/27/2014 9:45:26 AM      MDM   Final diagnoses:  Acute encephalopathy    Patient here for evaluation of acute confusion that  started last night. Patient is encephalopathic on exam but there is no  focal neurologic deficits. Additional history available from grandson after patient's initial evaluation. Grandson states that she's been very confused and not like herself since last night. He states that she's been licking the newspaper as well as Kleenex and at times has nonsensical speech. She has no history of dementia or confusion and is typically very aware of what is going on around her. Patient denies any recollection of the events that her grandson is discussing. Concern for acute delirium. Discussed with medicine regarding admission for further evaluation.    Quintella Reichert, MD 12/27/14 1714

## 2014-12-27 NOTE — ED Notes (Signed)
Patient transported to X-ray 

## 2014-12-27 NOTE — H&P (Deleted)
[facility] History and Physical                                                                                    [name], is a 81 y.o. female  MRN: [id number]   DOB - Dec 17, 1921  Admit Date - 12/27/2014  Outpatient Primary MD for the patient is [name], MD  With History of -  Past Medical History  Diagnosis Date  . Meningitis   . Macular degeneration   . Hypertension   . GERD (gastroesophageal reflux disease)   . Hyperlipidemia   . Elevated hemoglobin A1c   . Cataract   . Cancer 1979 mastectomy  . Glaucoma     Right pupil (about 27mm) is larger than left pupil (about 93mm) due to surgery in 1973.  Amblyopia in right eye.      Past Surgical History  Procedure Laterality Date  . Abdominal hysterectomy    . Mastectomy    . Coronary stent placement    . Colon resection    . Eye surgery  1973    Dr. Katy Fitch  . Breast surgery Left 1979    mastectomy  . Skin cancer excision  2008     Squamous cell - left thigh    in for   Chief Complaint  Patient presents with  . Altered Mental Status     HPI    [name]  is a 81 y.o. female,   Review of Systems   In addition to the HPI above,  No Fever-chills, No Headache, No changes with Vision or hearing, No problems swallowing food or Liquids, No Chest pain, Cough or Shortness of Breath, No Abdominal pain, No Nausea or Vomiting, Bowel movements are regular, No Blood in stool or Urine, No dysuria, No new skin rashes or bruises, No new joints pains-aches,  No new weakness, tingling, numbness in any extremity, No recent weight gain or loss, No polyuria, polydypsia or polyphagia, A full 10 point Review of Systems was done, except as stated above, all other Review of Systems were negative.  Social History History  Substance Use Topics  . Smoking status: Never Smoker   . Smokeless tobacco: Never Used  . Alcohol Use: No    Family History Family History  Problem Relation Age of Onset   . Hypertension Mother   . Hypertension Father   . CVA Father   . Arthritis Sister     Rheumatoid  . Heart attack Brother   . Heart disease Brother   . Diabetes Daughter   . Hypertension Daughter     Prior to Admission medications   Medication Sig Start Date End Date Taking? Authorizing Provider  ALPHA LIPOIC ACID PO Take 200 mg by mouth daily.      Historical Provider, MD  amoxicillin (AMOXIL) 500 MG capsule TAKE 4 CAPSULES BY MOUTH 1-2 HOURS BEFORE DENTAL WORK 02/09/14   Unk Pinto, MD  aspirin EC 81 MG tablet Take 81 mg by mouth daily.      Historical Provider, MD  B Complex-C (B-COMPLEX WITH VITAMIN C) tablet Take 1 tablet by mouth daily.      Historical Provider, MD  bumetanide (BUMEX) 2 MG tablet Take 1 tablet by mouth  daily for blood pressure  and fluid 10/08/14   Unk Pinto, MD  bumetanide (BUMEX) 2 MG tablet Take 1 mg by mouth as needed (for swelling, edema).    Historical Provider, MD  CHOLECALCIFEROL PO Take 9,000 Units by mouth daily.    Historical Provider, MD  diltiazem (CARDIZEM) 120 MG tablet Take 120 mg by mouth 2 (two) times daily.    Historical Provider, MD  dorzolamide-timolol (COSOPT) 22.3-6.8 MG/ML ophthalmic solution Place 1 drop into both eyes 2 (two) times daily. 12/03/14   Historical Provider, MD  Garlic 9604 MG CAPS Take 1,000 mg by mouth daily.    Historical Provider, MD  Iodine, Kelp, (KELP PO) Take 1 tablet by mouth daily.      Historical Provider, MD  isosorbide mononitrate (IMDUR) 30 MG 24 hr tablet Take 1 tablet by mouth  every night at bedtime 06/05/14   Unk Pinto, MD  Lactobacillus (ACIDOPHILUS) CAPS capsule Take 1 capsule by mouth daily.    Historical Provider, MD  latanoprost (XALATAN) 0.005 % ophthalmic solution Place 1 drop into both eyes at bedtime.      Historical Provider, MD  losartan (COZAAR) 100 MG tablet Take one-half tablet by   mouth twice a day for blood pressure 08/04/14   Unk Pinto, MD  losartan (COZAAR) 100 MG tablet  Take 50 mg by mouth 2 (two) times daily.    Historical Provider, MD  LUTEIN-ZEAXANTHIN PO Take 1 tablet by mouth daily.    Historical Provider, MD  meloxicam (MOBIC) 15 MG tablet Take 1 tablet by mouth  daily with food as needed  for pain 08/04/14   Unk Pinto, MD  metoprolol (TOPROL-XL) 200 MG 24 hr tablet Take 100 mg by mouth 2 (two) times daily.    Historical Provider, MD  minoxidil (LONITEN) 2.5 MG tablet Take 1 to 2 tablets daily for BP. Patient taking differently: Take 2.5 mg by mouth daily. Take 1 to 2 tablets daily for BP. 12/23/14   Unk Pinto, MD  OVER THE COUNTER MEDICATION Take 1 capsule by mouth every evening. Herbavision. Hold while in hospital     Historical Provider, MD  tiZANidine (ZANAFLEX) 4 MG tablet 1/2 to 1 tablet 3 x day if needed for muscle spasm if needed Patient taking differently: Take 4 mg by mouth 3 (three) times daily.  12/05/14 01/05/15  Unk Pinto, MD  traMADol (ULTRAM) 50 MG tablet TAKE 1 TABLET BY MOUTH TWICE DAILY AS NEEDED FOR PAIN 11/28/14   Vicie Mutters, PA-C  traMADol (ULTRAM) 50 MG tablet Take 50 mg by mouth 4 (four) times daily.    Historical Provider, MD  Vitamins-Lipotropics (B-50 COMPLEX) TABS Take 1 tablet by mouth daily.    Historical Provider, MD  Zinc 50 MG TABS Take 75 mg by mouth daily.    Historical Provider, MD    Allergies  Allergen Reactions  . Lactose Intolerance (Gi) Other (See Comments)    unknown    Physical Exam  Vitals  Blood pressure 176/83, pulse 84, temperature 98.4 F (36.9 C), temperature source Oral, resp. rate 20, SpO2 96 %.   General:  lying in bed in NAD,   Psych:  Normal affect and insight, Not Suicidal or Homicidal, Awake Alert, Oriented X 3.  Neuro:   No F.N deficits, ALL C.Nerves Intact, Strength 5/5 all 4 extremities, Sensation intact all 4 extremities.  ENT:  Ears and Eyes appear Normal, Conjunctivae clear, PER. Moist oral  mucosa without erythema or exudates.  Neck:  Supple, No lymphadenopathy  appreciated  Respiratory:  Symmetrical chest wall movement, Good air movement bilaterally, CTAB.  Cardiac:  RRR, No Murmurs, no LE edema noted, no JVD.    Abdomen:  Positive bowel sounds, Soft, Non tender, Non distended,  No masses appreciated  Skin:  No Cyanosis, Normal Skin Turgor, No Skin Rash or Bruise.  Extremities:  Able to move all 4. 5/5 strength in each,  no effusions.  Data Review  CBC  Recent Labs Lab 12/23/14 1949 12/27/14 1003  WBC 8.0 7.1  HGB 13.4 13.5  HCT 40.7 39.8  PLT 214 217  MCV 94.4 93.6  MCH 31.1 31.8  MCHC 32.9 33.9  RDW 13.7 13.8  LYMPHSABS  --  0.8  MONOABS  --  0.5  EOSABS  --  0.0  BASOSABS  --  0.0    Chemistries   Recent Labs Lab 12/23/14 1949 12/24/14 1220 12/27/14 1003  NA 140 140 140  K 3.6 3.7 3.8  CL 106 104 105  CO2 28 28 26   GLUCOSE 135* 88 135*  BUN 19 24* 11  CREATININE 0.91 0.87 0.76  CALCIUM 9.4 9.3 9.5  AST  --   --  25  ALT  --   --  25  ALKPHOS  --   --  79  BILITOT  --   --  0.8    estimated creatinine clearance is 44.1 mL/min (by C-G formula based on Cr of 0.76).  No results for input(s): TSH, T4TOTAL, T3FREE, THYROIDAB in the last 72 hours.  Invalid input(s): FREET3  Coagulation profile No results for input(s): INR, PROTIME in the last 168 hours.  No results for input(s): DDIMER in the last 72 hours.  Cardiac Enzymes  Recent Labs Lab 12/27/14 1003  TROPONINI <0.03    Invalid input(s): POCBNP  Urinalysis    Component Value Date/Time   COLORURINE YELLOW 12/27/2014 1020   APPEARANCEUR CLEAR 12/27/2014 1020   LABSPEC 1.006 12/27/2014 1020   PHURINE 7.0 12/27/2014 1020   GLUCOSEU NEGATIVE 12/27/2014 1020   HGBUR SMALL* 12/27/2014 1020   BILIRUBINUR NEGATIVE 12/27/2014 1020   KETONESUR NEGATIVE 12/27/2014 1020   PROTEINUR NEGATIVE 12/27/2014 1020   UROBILINOGEN 0.2 12/27/2014 1020   NITRITE NEGATIVE 12/27/2014 1020   LEUKOCYTESUR NEGATIVE 12/27/2014 1020    Imaging results:   Dg  Chest 2 View  12/27/2014   CLINICAL DATA:  Pt presents to department from home for evaluation of altered mental status. Family states she is "not acting normal." states she has been very confused. History of TIA. Pt denies pain upon arrival to ED. Pt is alert to self, but confused about place and situation  EXAM: CHEST  2 VIEW  COMPARISON:  11/18/2011  FINDINGS: Cardiac silhouette is mildly enlarged. Aorta is mildly uncoiled. No mediastinal or hilar masses.  No lung consolidation or edema. No pleural effusion or pneumothorax.  Status post left mastectomy, stable.  Bony thorax is demineralized but grossly intact.  IMPRESSION: No acute cardiopulmonary disease.   Electronically Signed   By: Lajean Manes M.D.   On: 12/27/2014 11:48   Ct Head Wo Contrast  12/27/2014   CLINICAL DATA:  Hx of Altered mental status Pt is alert and oriented at time of scanPt denies hx of stroke, seizures, Pt states she fell 2 weeks ago and injured LT Leg  Hx ZO:[id number] degeneration HTNCA skin, breast Hx [id number] stent placement Hysterectomy  EXAM: CT HEAD WITHOUT CONTRAST  TECHNIQUE: Contiguous axial images were obtained from the base of the skull through the vertex without intravenous contrast.  COMPARISON:  Brain MRI, 08/06/2010 and head CT, 08/05/2010.  FINDINGS: Ventricles normal configuration. There is ventricular and sulcal enlargement 6 with moderate atrophy. There are no parenchymal masses or mass effect. There is no evidence of a recent transcortical infarct. Few small foci of hypoattenuation are noted in the deep white matter invasive. This suggests a small lacunar infarcts. There is patchy white matter hypoattenuation elsewhere consistent mild chronic microvascular ischemic change.  There are no extra-axial masses or abnormal fluid collections.  There is no intracranial hemorrhage.  Small mucous retention cyst in the left maxillary sinus. Mild ethmoid sinus mucosal thickening. Clear mastoid air cells.   IMPRESSION: 1. No acute intracranial abnormalities. 2. Atrophy and mild chronic microvascular ischemic change. No significant change from the prior studies.   Electronically Signed   By: Lajean Manes M.D.   On: 12/27/2014 12:06    My personal review of EKG: NSR, No ST changes noted.   Assessment & Plan  Active Problems:   Acute encephalopathy   Altered mental status    DVT Prophylaxis:  AM Labs Ordered, also please review Full Orders  Family Communication:     Code Status:    Condition:    Time spent in minutes : 60   York, Sharnette Kitamura L PA-C on 12/27/2014 at 2:39 PM  Between 7am to 7pm - Pager - [phone]  After 7pm go to [email or web address] - password [email or web address]  And look for the night coverage person covering me after hours  [facility]

## 2014-12-28 DIAGNOSIS — I4892 Unspecified atrial flutter: Secondary | ICD-10-CM

## 2014-12-28 DIAGNOSIS — I10 Essential (primary) hypertension: Secondary | ICD-10-CM

## 2014-12-28 DIAGNOSIS — R4789 Other speech disturbances: Secondary | ICD-10-CM

## 2014-12-28 LAB — COMPREHENSIVE METABOLIC PANEL
ALBUMIN: 3.2 g/dL — AB (ref 3.5–5.2)
ALT: 23 U/L (ref 0–35)
AST: 24 U/L (ref 0–37)
Alkaline Phosphatase: 72 U/L (ref 39–117)
Anion gap: 5 (ref 5–15)
BILIRUBIN TOTAL: 1 mg/dL (ref 0.3–1.2)
BUN: 8 mg/dL (ref 6–23)
CO2: 28 mmol/L (ref 19–32)
Calcium: 9 mg/dL (ref 8.4–10.5)
Chloride: 109 mmol/L (ref 96–112)
Creatinine, Ser: 0.67 mg/dL (ref 0.50–1.10)
GFR calc Af Amer: 87 mL/min — ABNORMAL LOW (ref >90)
GFR, EST NON AFRICAN AMERICAN: 75 mL/min — AB (ref >90)
Glucose, Bld: 135 mg/dL — ABNORMAL HIGH (ref 70–99)
Potassium: 3.2 mmol/L — ABNORMAL LOW (ref 3.5–5.1)
Sodium: 142 mmol/L (ref 135–145)
TOTAL PROTEIN: 6.1 g/dL (ref 6.0–8.3)

## 2014-12-28 LAB — URINE CULTURE: Colony Count: 30000

## 2014-12-28 LAB — TROPONIN I: Troponin I: 0.06 ng/mL — ABNORMAL HIGH (ref <0.031)

## 2014-12-28 LAB — CBC
HCT: 40.3 % (ref 36.0–46.0)
HEMOGLOBIN: 13.5 g/dL (ref 12.0–15.0)
MCH: 31.5 pg (ref 26.0–34.0)
MCHC: 33.5 g/dL (ref 30.0–36.0)
MCV: 94.2 fL (ref 78.0–100.0)
PLATELETS: 222 10*3/uL (ref 150–400)
RBC: 4.28 MIL/uL (ref 3.87–5.11)
RDW: 13.9 % (ref 11.5–15.5)
WBC: 6.9 10*3/uL (ref 4.0–10.5)

## 2014-12-28 LAB — HEMOGLOBIN A1C
Hgb A1c MFr Bld: 6 % — ABNORMAL HIGH (ref 4.8–5.6)
MEAN PLASMA GLUCOSE: 126 mg/dL

## 2014-12-28 LAB — GLUCOSE, CAPILLARY
GLUCOSE-CAPILLARY: 133 mg/dL — AB (ref 70–99)
GLUCOSE-CAPILLARY: 122 mg/dL — AB (ref 70–99)
GLUCOSE-CAPILLARY: 112 mg/dL — AB (ref 70–99)
Glucose-Capillary: 134 mg/dL — ABNORMAL HIGH (ref 70–99)

## 2014-12-28 LAB — LIPID PANEL
Cholesterol: 163 mg/dL (ref 0–200)
HDL: 38 mg/dL — ABNORMAL LOW (ref >39)
LDL Cholesterol: 102 mg/dL — ABNORMAL HIGH (ref 0–99)
TRIGLYCERIDES: 113 mg/dL (ref <150)
Total CHOL/HDL Ratio: 4.3 RATIO
VLDL: 23 mg/dL (ref 0–40)

## 2014-12-28 LAB — FOLATE RBC
Folate, Hemolysate: >620 ng/mL
HEMATOCRIT: 45.3 % (ref 34.0–46.6)

## 2014-12-28 MED ORDER — POTASSIUM CHLORIDE CRYS ER 20 MEQ PO TBCR
40.0000 meq | EXTENDED_RELEASE_TABLET | Freq: Once | ORAL | Status: AC
  Administered 2014-12-28: 40 meq via ORAL
  Filled 2014-12-28: qty 2

## 2014-12-28 NOTE — Clinical Social Work Note (Signed)
Message left with granddaughter in attempt to assess patient for SNF placement.   Liz Beach MSW, [place], [place], [phone]

## 2014-12-28 NOTE — Progress Notes (Signed)
NEURO HOSPITALIST PROGRESS NOTE   SUBJECTIVE:                                                                                                                        Uneventful night. In good spirits, no neurological complains. MRI/MRA brain reviewed by myself showed no acute abnormality or proximal branch occlusion or hemodynamically significantstenosis identified within the intracranial circulation. New onset atrial flutter that converted back to SR.  OBJECTIVE:                                                                                                                           Vital signs in last 24 hours: Temp:  [97.5 F (36.4 C)-99.9 F (37.7 C)] 97.6 F (36.4 C) (02/01 0630) Pulse Rate:  [72-137] 72 (02/01 0630) Resp:  [18-23] 18 (02/01 0630) BP: (97-180)/(70-100) 107/70 mmHg (02/01 0642) SpO2:  [93 %-100 %] 97 % (02/01 0630)  Intake/Output from previous day: 01/31 0701 - 02/01 0700 In: 338.3 [P.O.:100; I.V.:238.3] Out: 2250 [Urine:2250] Intake/Output this shift: Total I/O In: 320 [P.O.:320] Out: 100 [Urine:100] Nutritional status: DIET SOFT  Past Medical History  Diagnosis Date  . Meningitis   . Macular degeneration   . Hypertension   . GERD (gastroesophageal reflux disease)   . Hyperlipidemia   . Elevated hemoglobin A1c   . Cataract   . Cancer 1979 mastectomy  . Glaucoma     Right pupil (about 4mm) is larger than left pupil (about 32mm) due to surgery in 1973.  Amblyopia in right eye.    Physical exam: pleasant female in no apparent distress. Head: normocephalic. Neck: supple, no bruits, no JVD. Cardiac: no murmurs. Lungs: clear. Abdomen: soft, no tender, no mass. Extremities: no edema. Skin: no rash  Neurologic Exam:  Mental Status: Patient is awake, alert, oriented to person only. 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 Pupils are equal, round, and reactive to light.  III,IV, VI: EOMI without ptosis or diploplia.  V: Facial sensation is symmetric to temperature VII: Facial movement is symmetric.  VIII: hearing is intact to voice X:  Uvula elevates symmetrically XI: Shoulder shrug is symmetric. XII: tongue is midline without atrophy or fasciculations.  Motor: Tone is normal. Bulk is normal. 5/5 strength was present in all four extremities.  Sensory: Sensation is symmetric to light touch and temperature in the arms and legs. Deep Tendon Reflexes: 2+ and symmetric in the biceps and patellae.  Plantars: Toes are downgoing bilaterally.  Cerebellar: FNF and HKS are intact bilaterally  Lab Results: Lab Results  Component Value Date/Time   CHOL 163 12/28/2014 03:33 AM   Lipid Panel  Recent Labs  12/28/14 0333  CHOL 163  TRIG 113  HDL 38*  CHOLHDL 4.3  VLDL 23  LDLCALC 102*    Studies/Results: Dg Chest 2 View  12/27/2014   CLINICAL DATA:  Pt presents to department from home for evaluation of altered mental status. Family states she is "not acting normal." states she has been very confused. History of TIA. Pt denies pain upon arrival to ED. Pt is alert to self, but confused about place and situation  EXAM: CHEST  2 VIEW  COMPARISON:  11/18/2011  FINDINGS: Cardiac silhouette is mildly enlarged. Aorta is mildly uncoiled. No mediastinal or hilar masses.  No lung consolidation or edema. No pleural effusion or pneumothorax.  Status post left mastectomy, stable.  Bony thorax is demineralized but grossly intact.  IMPRESSION: No acute cardiopulmonary disease.   Electronically Signed   By: Lajean Manes M.D.   On: 12/27/2014 11:48   Ct Head Wo Contrast  12/27/2014   CLINICAL DATA:  Hx of Altered mental status Pt is alert and oriented at time of scanPt denies hx of stroke, seizures, Pt states she fell 2 weeks ago and injured LT Leg  Hx  DJ:[id number] degeneration HTNCA skin, breast Hx JH:ERDEYCXKGYJEH stent placement Hysterectomy  EXAM: CT HEAD WITHOUT CONTRAST  TECHNIQUE: Contiguous axial images were obtained from the base of the skull through the vertex without intravenous contrast.  COMPARISON:  Brain MRI, 08/06/2010 and head CT, 08/05/2010.  FINDINGS: Ventricles normal configuration. There is ventricular and sulcal enlargement 6 with moderate atrophy. There are no parenchymal masses or mass effect. There is no evidence of a recent transcortical infarct. Few small foci of hypoattenuation are noted in the deep white matter invasive. This suggests a small lacunar infarcts. There is patchy white matter hypoattenuation elsewhere consistent mild chronic microvascular ischemic change.  There are no extra-axial masses or abnormal fluid collections.  There is no intracranial hemorrhage.  Small mucous retention cyst in the left maxillary sinus. Mild ethmoid sinus mucosal thickening. Clear mastoid air cells.  IMPRESSION: 1. No acute intracranial abnormalities. 2. Atrophy and mild chronic microvascular ischemic change. No significant change from the prior studies.   Electronically Signed   By: Lajean Manes M.D.   On: 12/27/2014 12:06   Mr Jeri Cos UD Contrast  12/27/2014   CLINICAL DATA:  Initial evaluation for mild confusion. History of meningitis, hyperlipidemia. Remote breast cancer.  EXAM: MRI HEAD WITHOUT AND WITH CONTRAST  MRA HEAD WITHOUT CONTRAST  TECHNIQUE: Multiplanar, multiecho pulse sequences of the brain and surrounding structures were obtained without and with intravenous contrast. Angiographic images of the head were obtained using MRA technique without contrast.  Multiplanar, multiecho pulse sequences of the brain and surrounding structures were obtained without AND WITH intravenous contrast. Angiographic images of the head were obtained using MRA technique without contrast.  CONTRAST:  5mL MULTIHANCE GADOBENATE DIMEGLUMINE  529 MG/ML IV SOLN  COMPARISON:  Prior CT from earlier the same day.  FINDINGS: MRI HEAD FINDINGS  Diffuse prominence of the CSF containing spaces is compatible with generalized cerebral atrophy. Attention confluent T2/FLAIR hyperdensity within the periventricular deep white matter both cerebral hemispheres most consistent with chronic small vessel ischemic changes. Small vessel changes present within the pons as well.  No abnormal foci of restricted diffusion to suggest acute intracranial infarct. Gray-white matter differentiation maintained. No acute or chronic intracranial hemorrhage.  No mass lesion or midline shift. No abnormal enhancement on post-contrast sequences. Ventricular prominence related to global parenchymal volume loss present without hydrocephalus. No extra-axial fluid collection.  Craniocervical junction within normal limits. Pituitary gland normal. No acute abnormality seen about the orbits.  Scattered mucoperiosteal thickening ethmoid air cells. No air-fluid level to suggest active sinus infection. No mastoid effusion.  Bone marrow signal intensity normal. Scalp soft tissues within normal limits.  MRA HEAD FINDINGS  ANTERIOR CIRCULATION:  Visualized distal cervical segments of the internal carotid arteries are widely patent with antegrade flow. The petrous, cavernous, and supra clinoid segments are widely patent. A1 segments appear to be codominant and patent bilaterally. Anterior communicating artery and anterior cerebral arteries widely patent.  M1 segments well opacified without proximal branch occlusion or hemodynamically significant stenosis. Mild multi focal distal branch irregularity present within the MCA branches bilaterally.  POSTERIOR CIRCULATION:  Left vertebral artery is dominant and widely patent to the level of the vertebrobasilar junction. Diminutive right vertebral artery divides at the right posterior inferior cerebellar artery with a a small hypoplastic branch ascending towards  the vertebrobasilar junction. Posterior inferior cerebral arteries well opacified along their proximal portions. Basilar artery tortuous but widely patent without significant stenosis.  Superior cerebellar arteries are patent proximally, but not well evaluated distally. Right P1 segment well opacified. Multi focal irregularity present within the right P2 segment without focal high-grade stenosis. There is fetal origin of the left posterior cerebral artery with a widely patent left posterior communicating artery. Mild distal branch irregularity present within the left P2 segment without significant stenosis.  IMPRESSION: MRI HEAD IMPRESSION:  1. No acute intracranial infarct or other abnormality identified within the brain. No abnormal enhancement. 2. Generalized cerebral atrophy with chronic microvascular ischemic disease involving the supratentorial white matter and pons.  MRA HEAD IMPRESSION:  1. No proximal branch occlusion or hemodynamically significant stenosis identified within the intracranial circulation. 2. Fetal origin of the left posterior cerebral artery with widely patent left posterior communicating artery. 3. Mild distal branch atherosclerotic irregularity involving the MCA and PCA branches bilaterally.   Electronically Signed   By: Jeannine Boga M.D.   On: 12/27/2014 21:52   Mr Jodene Nam Head/brain Wo Cm  12/27/2014   CLINICAL DATA:  Initial evaluation for mild confusion. History of meningitis, hyperlipidemia. Remote breast cancer.  EXAM: MRI HEAD WITHOUT AND WITH CONTRAST  MRA HEAD WITHOUT CONTRAST  TECHNIQUE: Multiplanar, multiecho pulse sequences of the brain and surrounding structures were obtained without and with intravenous contrast. Angiographic images of the head were obtained using MRA technique without contrast.  Multiplanar, multiecho pulse sequences of the brain and surrounding structures were obtained without AND WITH intravenous contrast. Angiographic images of the head were  obtained using MRA technique without contrast.  CONTRAST:  40mL MULTIHANCE GADOBENATE DIMEGLUMINE 529 MG/ML IV SOLN  COMPARISON:  Prior CT from earlier the same day.  FINDINGS: MRI HEAD FINDINGS  Diffuse prominence of the CSF containing spaces is compatible with generalized cerebral atrophy. Attention confluent T2/FLAIR hyperdensity within the periventricular deep white matter both cerebral hemispheres most consistent  with chronic small vessel ischemic changes. Small vessel changes present within the pons as well.  No abnormal foci of restricted diffusion to suggest acute intracranial infarct. Gray-white matter differentiation maintained. No acute or chronic intracranial hemorrhage.  No mass lesion or midline shift. No abnormal enhancement on post-contrast sequences. Ventricular prominence related to global parenchymal volume loss present without hydrocephalus. No extra-axial fluid collection.  Craniocervical junction within normal limits. Pituitary gland normal. No acute abnormality seen about the orbits.  Scattered mucoperiosteal thickening ethmoid air cells. No air-fluid level to suggest active sinus infection. No mastoid effusion.  Bone marrow signal intensity normal. Scalp soft tissues within normal limits.  MRA HEAD FINDINGS  ANTERIOR CIRCULATION:  Visualized distal cervical segments of the internal carotid arteries are widely patent with antegrade flow. The petrous, cavernous, and supra clinoid segments are widely patent. A1 segments appear to be codominant and patent bilaterally. Anterior communicating artery and anterior cerebral arteries widely patent.  M1 segments well opacified without proximal branch occlusion or hemodynamically significant stenosis. Mild multi focal distal branch irregularity present within the MCA branches bilaterally.  POSTERIOR CIRCULATION:  Left vertebral artery is dominant and widely patent to the level of the vertebrobasilar junction. Diminutive right vertebral artery divides at  the right posterior inferior cerebellar artery with a a small hypoplastic branch ascending towards the vertebrobasilar junction. Posterior inferior cerebral arteries well opacified along their proximal portions. Basilar artery tortuous but widely patent without significant stenosis.  Superior cerebellar arteries are patent proximally, but not well evaluated distally. Right P1 segment well opacified. Multi focal irregularity present within the right P2 segment without focal high-grade stenosis. There is fetal origin of the left posterior cerebral artery with a widely patent left posterior communicating artery. Mild distal branch irregularity present within the left P2 segment without significant stenosis.  IMPRESSION: MRI HEAD IMPRESSION:  1. No acute intracranial infarct or other abnormality identified within the brain. No abnormal enhancement. 2. Generalized cerebral atrophy with chronic microvascular ischemic disease involving the supratentorial white matter and pons.  MRA HEAD IMPRESSION:  1. No proximal branch occlusion or hemodynamically significant stenosis identified within the intracranial circulation. 2. Fetal origin of the left posterior cerebral artery with widely patent left posterior communicating artery. 3. Mild distal branch atherosclerotic irregularity involving the MCA and PCA branches bilaterally.   Electronically Signed   By: Jeannine Boga M.D.   On: 12/27/2014 21:52    MEDICATIONS                                                                                                                        Scheduled: . aspirin EC  81 mg Oral Daily  . cholecalciferol  2,000 Units Oral Daily  . diltiazem  120 mg Oral BID  . dorzolamide-timolol  1 drop Both Eyes BID  . enoxaparin (LOVENOX) injection  40 mg Subcutaneous Q24H  . lactobacillus acidophilus  1 tablet Oral Daily  . metoprolol  100 mg Oral BID  . pantoprazole  40  mg Oral Daily  . sodium chloride  3 mL Intravenous Q12H  .  sodium chloride  3 mL Intravenous Q12H    ASSESSMENT/PLAN:                                                                                                            81 year old female with mild confusion.  Non focal neuro-exam and unremarkable MRI/MRA brain as well as unimpressive serologies and UA.  UDS negative. Concern for underlying dementia. She will need outpatient neurological evaluation for possible dementia. No further inpatient neurological testing at this time. Will sign off.   Dorian Pod, MD [facility] [phone]  12/28/2014, 9:46 AM

## 2014-12-28 NOTE — Progress Notes (Deleted)
2D Echocardiogram Complete.  12/28/2014   [place], RDCS

## 2014-12-28 NOTE — Progress Notes (Signed)
Patient Name: [name] Date of Encounter: 12/28/2014     Principal Problem:   Acute encephalopathy Active Problems:   Hypertension   GERD (gastroesophageal reflux disease)   Hyperlipidemia   Glaucoma   Word finding difficulty    SUBJECTIVE  Pleasantly demented. Denies CP or SOB. Ramble about living alone and broken water heater.   CURRENT MEDS . aspirin EC  81 mg Oral Daily  . cholecalciferol  2,000 Units Oral Daily  . diltiazem  120 mg Oral BID  . dorzolamide-timolol  1 drop Both Eyes BID  . enoxaparin (LOVENOX) injection  40 mg Subcutaneous Q24H  . lactobacillus acidophilus  1 tablet Oral Daily  . metoprolol  100 mg Oral BID  . pantoprazole  40 mg Oral Daily  . sodium chloride  3 mL Intravenous Q12H  . sodium chloride  3 mL Intravenous Q12H    OBJECTIVE  Filed Vitals:   12/28/14 0630 12/28/14 0630 12/28/14 0642 12/28/14 1028  BP: 107/70 177/76 107/70 130/72  Pulse:  72  80  Temp:  97.6 F (36.4 C)  98.4 F (36.9 C)  TempSrc:  Oral    Resp:  18  16  SpO2:  97%  99%    Intake/Output Summary (Last 24 hours) at 12/28/14 1148 Last data filed at 12/28/14 1039  Gross per 24 hour  Intake 998.33 ml  Output   1675 ml  Net -676.67 ml   There were no vitals filed for this visit.  PHYSICAL EXAM  General: Pleasant, NAD. Pleasantly demented Neuro: Alert and oriented X 3. Moves all extremities spontaneously. Psych: Normal affect. HEENT:  Normal  Neck: Supple without bruits or JVD. Lungs:  Resp regular and unlabored, CTA. Heart: RRR no s3, s4, or murmurs. Abdomen: Soft, non-tender, non-distended, BS + x 4.  Extremities: No clubbing, cyanosis or edema. DP/PT/Radials 2+ and equal bilaterally.  Accessory Clinical Findings  CBC  Recent Labs  12/27/14 1003 12/27/14 1556 12/28/14 0333  WBC 7.1 7.4 6.9  NEUTROABS 5.8  --   --   HGB 13.5 14.9 13.5  HCT 39.8 43.9 40.3  MCV 93.6 93.8 94.2  PLT 217 267 892   Basic Metabolic Panel  Recent Labs  12/27/14 1003 12/27/14 1556 12/27/14 1910 12/28/14 0333  NA 140  --   --  142  K 3.8  --   --  3.2*  CL 105  --   --  109  CO2 26  --   --  28  GLUCOSE 135*  --   --  135*  BUN 11  --   --  8  CREATININE 0.76 0.72  --  0.67  CALCIUM 9.5  --   --  9.0  MG  --   --  2.3  --    Liver Function Tests  Recent Labs  12/27/14 1003 12/28/14 0333  AST 25 24  ALT 25 23  ALKPHOS 79 72  BILITOT 0.8 1.0  PROT 6.5 6.1  ALBUMIN 3.6 3.2*   No results for input(s): LIPASE, AMYLASE in the last 72 hours. Cardiac Enzymes  Recent Labs  12/27/14 1556 12/27/14 1910 12/28/14 0333  TROPONINI 0.04* 0.10* 0.06*   BNP Invalid input(s): POCBNP D-Dimer No results for input(s): DDIMER in the last 72 hours. Hemoglobin A1C No results for input(s): HGBA1C in the last 72 hours. Fasting Lipid Panel  Recent Labs  12/28/14 0333  CHOL 163  HDL 38*  LDLCALC 102*  TRIG 113  CHOLHDL 4.3  Thyroid Function Tests  Recent Labs  12/27/14 1556  TSH 1.206    TELE  NSR  Radiology/Studies  Dg Chest 2 View  12/27/2014   CLINICAL DATA:  Pt presents to department from home for evaluation of altered mental status. Family states she is "not acting normal." states she has been very confused. History of TIA. Pt denies pain upon arrival to ED. Pt is alert to self, but confused about place and situation  EXAM: CHEST  2 VIEW  COMPARISON:  11/18/2011  FINDINGS: Cardiac silhouette is mildly enlarged. Aorta is mildly uncoiled. No mediastinal or hilar masses.  No lung consolidation or edema. No pleural effusion or pneumothorax.  Status post left mastectomy, stable.  Bony thorax is demineralized but grossly intact.  IMPRESSION: No acute cardiopulmonary disease.   Electronically Signed   By: Lajean Manes M.D.   On: 12/27/2014 11:48   Ct Head Wo Contrast  12/27/2014   CLINICAL DATA:  Hx of Altered mental status Pt is alert and oriented at time of scanPt denies hx of stroke, seizures, Pt states she fell 2 weeks  ago and injured LT Leg  Hx QB:[id number] degeneration HTNCA skin, breast Hx EQ:[id number] stent placement Hysterectomy  EXAM: CT HEAD WITHOUT CONTRAST  TECHNIQUE: Contiguous axial images were obtained from the base of the skull through the vertex without intravenous contrast.  COMPARISON:  Brain MRI, 08/06/2010 and head CT, 08/05/2010.  FINDINGS: Ventricles normal configuration. There is ventricular and sulcal enlargement 6 with moderate atrophy. There are no parenchymal masses or mass effect. There is no evidence of a recent transcortical infarct. Few small foci of hypoattenuation are noted in the deep white matter invasive. This suggests a small lacunar infarcts. There is patchy white matter hypoattenuation elsewhere consistent mild chronic microvascular ischemic change.  There are no extra-axial masses or abnormal fluid collections.  There is no intracranial hemorrhage.  Small mucous retention cyst in the left maxillary sinus. Mild ethmoid sinus mucosal thickening. Clear mastoid air cells.  IMPRESSION: 1. No acute intracranial abnormalities. 2. Atrophy and mild chronic microvascular ischemic change. No significant change from the prior studies.   Electronically Signed   By: Lajean Manes M.D.   On: 12/27/2014 12:06   Mr Jeri Cos GX Contrast  12/27/2014   CLINICAL DATA:  Initial evaluation for mild confusion. History of meningitis, hyperlipidemia. Remote breast cancer.  EXAM: MRI HEAD WITHOUT AND WITH CONTRAST  MRA HEAD WITHOUT CONTRAST  TECHNIQUE: Multiplanar, multiecho pulse sequences of the brain and surrounding structures were obtained without and with intravenous contrast. Angiographic images of the head were obtained using MRA technique without contrast.  Multiplanar, multiecho pulse sequences of the brain and surrounding structures were obtained without AND WITH intravenous contrast. Angiographic images of the head were obtained using MRA technique without contrast.  CONTRAST:  44mL  MULTIHANCE GADOBENATE DIMEGLUMINE 529 MG/ML IV SOLN  COMPARISON:  Prior CT from earlier the same day.  FINDINGS: MRI HEAD FINDINGS  Diffuse prominence of the CSF containing spaces is compatible with generalized cerebral atrophy. Attention confluent T2/FLAIR hyperdensity within the periventricular deep white matter both cerebral hemispheres most consistent with chronic small vessel ischemic changes. Small vessel changes present within the pons as well.  No abnormal foci of restricted diffusion to suggest acute intracranial infarct. Gray-white matter differentiation maintained. No acute or chronic intracranial hemorrhage.  No mass lesion or midline shift. No abnormal enhancement on post-contrast sequences. Ventricular prominence related to global parenchymal volume loss present without hydrocephalus. No extra-axial fluid collection.  Craniocervical junction within normal limits. Pituitary gland normal. No acute abnormality seen about the orbits.  Scattered mucoperiosteal thickening ethmoid air cells. No air-fluid level to suggest active sinus infection. No mastoid effusion.  Bone marrow signal intensity normal. Scalp soft tissues within normal limits.  MRA HEAD FINDINGS  ANTERIOR CIRCULATION:  Visualized distal cervical segments of the internal carotid arteries are widely patent with antegrade flow. The petrous, cavernous, and supra clinoid segments are widely patent. A1 segments appear to be codominant and patent bilaterally. Anterior communicating artery and anterior cerebral arteries widely patent.  M1 segments well opacified without proximal branch occlusion or hemodynamically significant stenosis. Mild multi focal distal branch irregularity present within the MCA branches bilaterally.  POSTERIOR CIRCULATION:  Left vertebral artery is dominant and widely patent to the level of the vertebrobasilar junction. Diminutive right vertebral artery divides at the right posterior inferior cerebellar artery with a a small  hypoplastic branch ascending towards the vertebrobasilar junction. Posterior inferior cerebral arteries well opacified along their proximal portions. Basilar artery tortuous but widely patent without significant stenosis.  Superior cerebellar arteries are patent proximally, but not well evaluated distally. Right P1 segment well opacified. Multi focal irregularity present within the right P2 segment without focal high-grade stenosis. There is fetal origin of the left posterior cerebral artery with a widely patent left posterior communicating artery. Mild distal branch irregularity present within the left P2 segment without significant stenosis.  IMPRESSION: MRI HEAD IMPRESSION:  1. No acute intracranial infarct or other abnormality identified within the brain. No abnormal enhancement. 2. Generalized cerebral atrophy with chronic microvascular ischemic disease involving the supratentorial white matter and pons.  MRA HEAD IMPRESSION:  1. No proximal branch occlusion or hemodynamically significant stenosis identified within the intracranial circulation. 2. Fetal origin of the left posterior cerebral artery with widely patent left posterior communicating artery. 3. Mild distal branch atherosclerotic irregularity involving the MCA and PCA branches bilaterally.   Electronically Signed   By: Jeannine Boga M.D.   On: 12/27/2014 21:52   Mr Jodene Nam Head/brain Wo Cm  12/27/2014   CLINICAL DATA:  Initial evaluation for mild confusion. History of meningitis, hyperlipidemia. Remote breast cancer.  EXAM: MRI HEAD WITHOUT AND WITH CONTRAST  MRA HEAD WITHOUT CONTRAST  TECHNIQUE: Multiplanar, multiecho pulse sequences of the brain and surrounding structures were obtained without and with intravenous contrast. Angiographic images of the head were obtained using MRA technique without contrast.  Multiplanar, multiecho pulse sequences of the brain and surrounding structures were obtained without AND WITH intravenous contrast.  Angiographic images of the head were obtained using MRA technique without contrast.  CONTRAST:  51mL MULTIHANCE GADOBENATE DIMEGLUMINE 529 MG/ML IV SOLN  COMPARISON:  Prior CT from earlier the same day.  FINDINGS: MRI HEAD FINDINGS  Diffuse prominence of the CSF containing spaces is compatible with generalized cerebral atrophy. Attention confluent T2/FLAIR hyperdensity within the periventricular deep white matter both cerebral hemispheres most consistent with chronic small vessel ischemic changes. Small vessel changes present within the pons as well.  No abnormal foci of restricted diffusion to suggest acute intracranial infarct. Gray-white matter differentiation maintained. No acute or chronic intracranial hemorrhage.  No mass lesion or midline shift. No abnormal enhancement on post-contrast sequences. Ventricular prominence related to global parenchymal volume loss present without hydrocephalus. No extra-axial fluid collection.  Craniocervical junction within normal limits. Pituitary gland normal. No acute abnormality seen about the orbits.  Scattered mucoperiosteal thickening ethmoid air cells. No air-fluid level to suggest active sinus infection. No mastoid effusion.  Bone marrow signal intensity normal. Scalp soft tissues within normal limits.  MRA HEAD FINDINGS  ANTERIOR CIRCULATION:  Visualized distal cervical segments of the internal carotid arteries are widely patent with antegrade flow. The petrous, cavernous, and supra clinoid segments are widely patent. A1 segments appear to be codominant and patent bilaterally. Anterior communicating artery and anterior cerebral arteries widely patent.  M1 segments well opacified without proximal branch occlusion or hemodynamically significant stenosis. Mild multi focal distal branch irregularity present within the MCA branches bilaterally.  POSTERIOR CIRCULATION:  Left vertebral artery is dominant and widely patent to the level of the vertebrobasilar junction.  Diminutive right vertebral artery divides at the right posterior inferior cerebellar artery with a a small hypoplastic branch ascending towards the vertebrobasilar junction. Posterior inferior cerebral arteries well opacified along their proximal portions. Basilar artery tortuous but widely patent without significant stenosis.  Superior cerebellar arteries are patent proximally, but not well evaluated distally. Right P1 segment well opacified. Multi focal irregularity present within the right P2 segment without focal high-grade stenosis. There is fetal origin of the left posterior cerebral artery with a widely patent left posterior communicating artery. Mild distal branch irregularity present within the left P2 segment without significant stenosis.  IMPRESSION: MRI HEAD IMPRESSION:  1. No acute intracranial infarct or other abnormality identified within the brain. No abnormal enhancement. 2. Generalized cerebral atrophy with chronic microvascular ischemic disease involving the supratentorial white matter and pons.  MRA HEAD IMPRESSION:  1. No proximal branch occlusion or hemodynamically significant stenosis identified within the intracranial circulation. 2. Fetal origin of the left posterior cerebral artery with widely patent left posterior communicating artery. 3. Mild distal branch atherosclerotic irregularity involving the MCA and PCA branches bilaterally.   Electronically Signed   By: Jeannine Boga M.D.   On: 12/27/2014 21:52    ASSESSMENT AND PLAN  Karimah LAETITIA SCHNEPF is a 81 y.o. female with a medical history of CAD s/p PCI in the 90s, hypertension, TIA, meningitis, who presented 12/27/14 with altered mental status. Cardiology was consulted last night for new onset atrial flutter.  New onset Atrial Flutter -- Spontaneously converted back to sinus rhythm  -- Already on significant AV nodal blockade (metoprolol XL 100mg  BID and diltiazem 120mg  BID). Continue these for now. -- Could consider  amiodarone if flutter becomes a recurrent issue. -- TTE pending  -- CHADSVASC score at least 7: (HTN 1, age 89, female 70, TIA 2, HTN 1) Started on anticoagulation with fondaparinux 2.5mg  daily by fellow last night   CAD- s/p remote stenting -- Troponin slightly elevated and down trending 0.10--> 0.06. Likely demand ischemia in the setting of Aflutter. -- Continue aspirin 81mg . Prior PCI but likely just medical management here.  AMS- Non focal neuro-exam and unremarkable MRI/MRA brain as well as unimpressive serologies and UA.  UDS negative. Neuro signed off.  -- Concern for underlying dementia.  Hypokalemia- supplement.  HTN- well controlled on current regimen  SHE IS A DNR    Signed, Eileen Stanford PA-C  Pager [phone]   Patient seen and examined. Agree with assessment and plan. Suspect demand ischemia secondary to AFlutter; now maintaining NSR. With age 12, as long as she maintains sinus rhythm may not be candidate for long term anticoagulation with 2 falls in the past 6 - 8 months.    Troy Sine, MD, [facility] 12/28/2014 12:34 PM

## 2014-12-28 NOTE — Progress Notes (Signed)
*  PRELIMINARY RESULTS* Vascular Ultrasound Carotid Duplex (Doppler) has been completed.  Preliminary findings: Bilateral:  1-39% ICA stenosis.  Vertebral artery flow is antegrade.      Landry Mellow, RDMS, RVT  12/28/2014, 3:50 PM

## 2014-12-28 NOTE — Progress Notes (Signed)
2D Echocardiogram Complete.  12/28/2014   [place], RDCS

## 2014-12-28 NOTE — Progress Notes (Signed)
OT Cancellation Note  Patient Details Name: [name] MRN: [id number] DOB: 04-08-1922   Cancelled Treatment:    Reason Eval/Treat Not Completed: OT screened. Pt is Medicare and current D/C plan is SNF. Spoke with nurse and pt planning to go to SNF tomorrow or Wednesday. No apparent immediate acute care OT needs, therefore will defer OT to SNF. If OT eval is needed please call Acute Rehab Dept. at [phone] or text page OT at [phone].    Benito Mccreedy OTR/L [phone] 12/28/2014, 12:04 PM

## 2014-12-28 NOTE — Progress Notes (Signed)
Patient with a hemorrhoid that was bleeding tonight after a BM. Night RN made aware and will continue to monitor.

## 2014-12-28 NOTE — Progress Notes (Signed)
[facility] PROGRESS NOTE  [name] BLT:[id number] DOB: 04/14/1922 DOA: 12/27/2014 PCP: [name], MD  Assessment/Plan: 1-Acute encephalopathy -Might be related to dementia.  -MRI negative. Chest x ray negative for infection.  -No fever, no leukocytosis.  -Ammonia level at 16, TSH at 1.2. B 12 ; 711,  -RPR, Hb A1 c pending.  -urine culture pending.   Afib with RVR Now sinus rhythm.  On aspirin.  Follow cardiology recommendation for anticoagulation.  Continue with metoprolol and Cardizem.  Mild elevated troponin.  ECHO pending.   HTN On metoprolol, and Cardizem.   Hypokalemia; replete with 40 meq times one.   Glaucoma Continue eye drops.  Code Status: DNR Family Communication: care discussed with patient Disposition Plan: Need SNF   Consultants:  Cardiology  Neurology  Procedures:  ECHO; pending    Antibiotics:  none  HPI/Subjective: Alert, very pleasant. No complaints.    Objective: Filed Vitals:   12/28/14 1028  BP: 130/72  Pulse: 80  Temp: 98.4 F (36.9 C)  Resp: 16    Intake/Output Summary (Last 24 hours) at 12/28/14 1347 Last data filed at 12/28/14 1039  Gross per 24 hour  Intake 998.33 ml  Output   1675 ml  Net -676.67 ml   There were no vitals filed for this visit.  Exam:   General:  Alert in no distress. Conversant, pleasantly confuse.   Cardiovascular: S 1, S 2 RRR  Respiratory: CTA  Abdomen: BS present, soft, distended.   Musculoskeletal: no edema  Neuro non focal.   Data Reviewed: Basic Metabolic Panel:  Recent Labs Lab 12/23/14 1949 12/24/14 1220 12/27/14 1003 12/27/14 1556 12/27/14 1910 12/28/14 0333  NA 140 140 140  --   --  142  K 3.6 3.7 3.8  --   --  3.2*  CL 106 104 105  --   --  109  CO2 28 28 26   --   --  28  GLUCOSE 135* 88 135*  --   --  135*  BUN 19 24* 11  --   --  8  CREATININE 0.91 0.87 0.76 0.72  --  0.67  CALCIUM 9.4 9.3 9.5  --   --  9.0  MG  --   --   --    --  2.3  --    Liver Function Tests:  Recent Labs Lab 12/27/14 1003 12/28/14 0333  AST 25 24  ALT 25 23  ALKPHOS 79 72  BILITOT 0.8 1.0  PROT 6.5 6.1  ALBUMIN 3.6 3.2*   No results for input(s): LIPASE, AMYLASE in the last 168 hours.  Recent Labs Lab 12/27/14 1910  AMMONIA 16   CBC:  Recent Labs Lab 12/23/14 1949 12/27/14 1003 12/27/14 1556 12/28/14 0333  WBC 8.0 7.1 7.4 6.9  NEUTROABS  --  5.8  --   --   HGB 13.4 13.5 14.9 13.5  HCT 40.7 39.8 43.9 40.3  MCV 94.4 93.6 93.8 94.2  PLT 214 217 267 222   Cardiac Enzymes:  Recent Labs Lab 12/27/14 1003 12/27/14 1556 12/27/14 1910 12/28/14 0333  TROPONINI <0.03 0.04* 0.10* 0.06*   BNP (last 3 results) No results for input(s): PROBNP in the last 8760 hours. CBG:  Recent Labs Lab 12/27/14 1647 12/27/14 2114 12/28/14 0806 12/28/14 1210  GLUCAP 147* 122* 134* 133*    No results found for this or any previous visit (from the past 240 hour(s)).   Studies: Dg Chest 2 View  12/27/2014  CLINICAL DATA:  Pt presents to department from home for evaluation of altered mental status. Family states she is "not acting normal." states she has been very confused. History of TIA. Pt denies pain upon arrival to ED. Pt is alert to self, but confused about place and situation  EXAM: CHEST  2 VIEW  COMPARISON:  11/18/2011  FINDINGS: Cardiac silhouette is mildly enlarged. Aorta is mildly uncoiled. No mediastinal or hilar masses.  No lung consolidation or edema. No pleural effusion or pneumothorax.  Status post left mastectomy, stable.  Bony thorax is demineralized but grossly intact.  IMPRESSION: No acute cardiopulmonary disease.   Electronically Signed   By: Lajean Manes M.D.   On: 12/27/2014 11:48   Ct Head Wo Contrast  12/27/2014   CLINICAL DATA:  Hx of Altered mental status Pt is alert and oriented at time of scanPt denies hx of stroke, seizures, Pt states she fell 2 weeks ago and injured LT Leg  Hx PO:[id number]  degeneration HTNCA skin, breast Hx PP:[id number] stent placement Hysterectomy  EXAM: CT HEAD WITHOUT CONTRAST  TECHNIQUE: Contiguous axial images were obtained from the base of the skull through the vertex without intravenous contrast.  COMPARISON:  Brain MRI, 08/06/2010 and head CT, 08/05/2010.  FINDINGS: Ventricles normal configuration. There is ventricular and sulcal enlargement 6 with moderate atrophy. There are no parenchymal masses or mass effect. There is no evidence of a recent transcortical infarct. Few small foci of hypoattenuation are noted in the deep white matter invasive. This suggests a small lacunar infarcts. There is patchy white matter hypoattenuation elsewhere consistent mild chronic microvascular ischemic change.  There are no extra-axial masses or abnormal fluid collections.  There is no intracranial hemorrhage.  Small mucous retention cyst in the left maxillary sinus. Mild ethmoid sinus mucosal thickening. Clear mastoid air cells.  IMPRESSION: 1. No acute intracranial abnormalities. 2. Atrophy and mild chronic microvascular ischemic change. No significant change from the prior studies.   Electronically Signed   By: Lajean Manes M.D.   On: 12/27/2014 12:06   Mr Jeri Cos DX Contrast  12/27/2014   CLINICAL DATA:  Initial evaluation for mild confusion. History of meningitis, hyperlipidemia. Remote breast cancer.  EXAM: MRI HEAD WITHOUT AND WITH CONTRAST  MRA HEAD WITHOUT CONTRAST  TECHNIQUE: Multiplanar, multiecho pulse sequences of the brain and surrounding structures were obtained without and with intravenous contrast. Angiographic images of the head were obtained using MRA technique without contrast.  Multiplanar, multiecho pulse sequences of the brain and surrounding structures were obtained without AND WITH intravenous contrast. Angiographic images of the head were obtained using MRA technique without contrast.  CONTRAST:  63mL MULTIHANCE GADOBENATE DIMEGLUMINE 529 MG/ML IV SOLN   COMPARISON:  Prior CT from earlier the same day.  FINDINGS: MRI HEAD FINDINGS  Diffuse prominence of the CSF containing spaces is compatible with generalized cerebral atrophy. Attention confluent T2/FLAIR hyperdensity within the periventricular deep white matter both cerebral hemispheres most consistent with chronic small vessel ischemic changes. Small vessel changes present within the pons as well.  No abnormal foci of restricted diffusion to suggest acute intracranial infarct. Gray-white matter differentiation maintained. No acute or chronic intracranial hemorrhage.  No mass lesion or midline shift. No abnormal enhancement on post-contrast sequences. Ventricular prominence related to global parenchymal volume loss present without hydrocephalus. No extra-axial fluid collection.  Craniocervical junction within normal limits. Pituitary gland normal. No acute abnormality seen about the orbits.  Scattered mucoperiosteal thickening ethmoid air cells. No air-fluid level to suggest active  sinus infection. No mastoid effusion.  Bone marrow signal intensity normal. Scalp soft tissues within normal limits.  MRA HEAD FINDINGS  ANTERIOR CIRCULATION:  Visualized distal cervical segments of the internal carotid arteries are widely patent with antegrade flow. The petrous, cavernous, and supra clinoid segments are widely patent. A1 segments appear to be codominant and patent bilaterally. Anterior communicating artery and anterior cerebral arteries widely patent.  M1 segments well opacified without proximal branch occlusion or hemodynamically significant stenosis. Mild multi focal distal branch irregularity present within the MCA branches bilaterally.  POSTERIOR CIRCULATION:  Left vertebral artery is dominant and widely patent to the level of the vertebrobasilar junction. Diminutive right vertebral artery divides at the right posterior inferior cerebellar artery with a a small hypoplastic branch ascending towards the  vertebrobasilar junction. Posterior inferior cerebral arteries well opacified along their proximal portions. Basilar artery tortuous but widely patent without significant stenosis.  Superior cerebellar arteries are patent proximally, but not well evaluated distally. Right P1 segment well opacified. Multi focal irregularity present within the right P2 segment without focal high-grade stenosis. There is fetal origin of the left posterior cerebral artery with a widely patent left posterior communicating artery. Mild distal branch irregularity present within the left P2 segment without significant stenosis.  IMPRESSION: MRI HEAD IMPRESSION:  1. No acute intracranial infarct or other abnormality identified within the brain. No abnormal enhancement. 2. Generalized cerebral atrophy with chronic microvascular ischemic disease involving the supratentorial white matter and pons.  MRA HEAD IMPRESSION:  1. No proximal branch occlusion or hemodynamically significant stenosis identified within the intracranial circulation. 2. Fetal origin of the left posterior cerebral artery with widely patent left posterior communicating artery. 3. Mild distal branch atherosclerotic irregularity involving the MCA and PCA branches bilaterally.   Electronically Signed   By: Jeannine Boga M.D.   On: 12/27/2014 21:52   Mr Jodene Nam Head/brain Wo Cm  12/27/2014   CLINICAL DATA:  Initial evaluation for mild confusion. History of meningitis, hyperlipidemia. Remote breast cancer.  EXAM: MRI HEAD WITHOUT AND WITH CONTRAST  MRA HEAD WITHOUT CONTRAST  TECHNIQUE: Multiplanar, multiecho pulse sequences of the brain and surrounding structures were obtained without and with intravenous contrast. Angiographic images of the head were obtained using MRA technique without contrast.  Multiplanar, multiecho pulse sequences of the brain and surrounding structures were obtained without AND WITH intravenous contrast. Angiographic images of the head were obtained  using MRA technique without contrast.  CONTRAST:  17mL MULTIHANCE GADOBENATE DIMEGLUMINE 529 MG/ML IV SOLN  COMPARISON:  Prior CT from earlier the same day.  FINDINGS: MRI HEAD FINDINGS  Diffuse prominence of the CSF containing spaces is compatible with generalized cerebral atrophy. Attention confluent T2/FLAIR hyperdensity within the periventricular deep white matter both cerebral hemispheres most consistent with chronic small vessel ischemic changes. Small vessel changes present within the pons as well.  No abnormal foci of restricted diffusion to suggest acute intracranial infarct. Gray-white matter differentiation maintained. No acute or chronic intracranial hemorrhage.  No mass lesion or midline shift. No abnormal enhancement on post-contrast sequences. Ventricular prominence related to global parenchymal volume loss present without hydrocephalus. No extra-axial fluid collection.  Craniocervical junction within normal limits. Pituitary gland normal. No acute abnormality seen about the orbits.  Scattered mucoperiosteal thickening ethmoid air cells. No air-fluid level to suggest active sinus infection. No mastoid effusion.  Bone marrow signal intensity normal. Scalp soft tissues within normal limits.  MRA HEAD FINDINGS  ANTERIOR CIRCULATION:  Visualized distal cervical segments of the internal carotid  arteries are widely patent with antegrade flow. The petrous, cavernous, and supra clinoid segments are widely patent. A1 segments appear to be codominant and patent bilaterally. Anterior communicating artery and anterior cerebral arteries widely patent.  M1 segments well opacified without proximal branch occlusion or hemodynamically significant stenosis. Mild multi focal distal branch irregularity present within the MCA branches bilaterally.  POSTERIOR CIRCULATION:  Left vertebral artery is dominant and widely patent to the level of the vertebrobasilar junction. Diminutive right vertebral artery divides at the right  posterior inferior cerebellar artery with a a small hypoplastic branch ascending towards the vertebrobasilar junction. Posterior inferior cerebral arteries well opacified along their proximal portions. Basilar artery tortuous but widely patent without significant stenosis.  Superior cerebellar arteries are patent proximally, but not well evaluated distally. Right P1 segment well opacified. Multi focal irregularity present within the right P2 segment without focal high-grade stenosis. There is fetal origin of the left posterior cerebral artery with a widely patent left posterior communicating artery. Mild distal branch irregularity present within the left P2 segment without significant stenosis.  IMPRESSION: MRI HEAD IMPRESSION:  1. No acute intracranial infarct or other abnormality identified within the brain. No abnormal enhancement. 2. Generalized cerebral atrophy with chronic microvascular ischemic disease involving the supratentorial white matter and pons.  MRA HEAD IMPRESSION:  1. No proximal branch occlusion or hemodynamically significant stenosis identified within the intracranial circulation. 2. Fetal origin of the left posterior cerebral artery with widely patent left posterior communicating artery. 3. Mild distal branch atherosclerotic irregularity involving the MCA and PCA branches bilaterally.   Electronically Signed   By: Jeannine Boga M.D.   On: 12/27/2014 21:52    Scheduled Meds: . aspirin EC  81 mg Oral Daily  . cholecalciferol  2,000 Units Oral Daily  . diltiazem  120 mg Oral BID  . dorzolamide-timolol  1 drop Both Eyes BID  . enoxaparin (LOVENOX) injection  40 mg Subcutaneous Q24H  . lactobacillus acidophilus  1 tablet Oral Daily  . metoprolol  100 mg Oral BID  . pantoprazole  40 mg Oral Daily  . sodium chloride  3 mL Intravenous Q12H  . sodium chloride  3 mL Intravenous Q12H   Continuous Infusions:   Principal Problem:   Acute encephalopathy Active Problems:    Hypertension   GERD (gastroesophageal reflux disease)   Hyperlipidemia   Glaucoma   Word finding difficulty   Atrial flutter, unspecified    Time spent: 35 minutes.     Niel Hummer A  [facility] Pager [phone]. If 7PM-7AM, please contact night-coverage at [email or web address], password [facility] 12/28/2014, 1:47 PM  LOS: 1 day

## 2014-12-29 DIAGNOSIS — H409 Unspecified glaucoma: Secondary | ICD-10-CM

## 2014-12-29 DIAGNOSIS — I10 Essential (primary) hypertension: Secondary | ICD-10-CM

## 2014-12-29 LAB — HEMOGLOBIN A1C
Hgb A1c MFr Bld: 5.8 % — ABNORMAL HIGH (ref 4.8–5.6)
Mean Plasma Glucose: 120 mg/dL

## 2014-12-29 LAB — BASIC METABOLIC PANEL
ANION GAP: 9 (ref 5–15)
BUN: 19 mg/dL (ref 6–23)
CO2: 23 mmol/L (ref 19–32)
Calcium: 9.3 mg/dL (ref 8.4–10.5)
Chloride: 110 mmol/L (ref 96–112)
Creatinine, Ser: 0.77 mg/dL (ref 0.50–1.10)
GFR calc non Af Amer: 72 mL/min — ABNORMAL LOW (ref >90)
GFR, EST AFRICAN AMERICAN: 83 mL/min — AB (ref >90)
Glucose, Bld: 138 mg/dL — ABNORMAL HIGH (ref 70–99)
Potassium: 3.3 mmol/L — ABNORMAL LOW (ref 3.5–5.1)
SODIUM: 142 mmol/L (ref 135–145)

## 2014-12-29 LAB — RPR: RPR Ser Ql: REACTIVE — AB

## 2014-12-29 LAB — GLUCOSE, CAPILLARY
GLUCOSE-CAPILLARY: 117 mg/dL — AB (ref 70–99)
GLUCOSE-CAPILLARY: 131 mg/dL — AB (ref 70–99)
Glucose-Capillary: 125 mg/dL — ABNORMAL HIGH (ref 70–99)
Glucose-Capillary: 129 mg/dL — ABNORMAL HIGH (ref 70–99)

## 2014-12-29 LAB — RPR, QUANT+TP ABS (REFLEX)
Rapid Plasma Reagin, Quant: 1:1 {titer} — ABNORMAL HIGH
TREPONEMA PALLIDUM AB: NEGATIVE

## 2014-12-29 MED ORDER — POTASSIUM CHLORIDE CRYS ER 20 MEQ PO TBCR
40.0000 meq | EXTENDED_RELEASE_TABLET | Freq: Once | ORAL | Status: AC
  Administered 2014-12-29: 40 meq via ORAL
  Filled 2014-12-29: qty 2

## 2014-12-29 MED ORDER — DILTIAZEM HCL 90 MG PO TABS
90.0000 mg | ORAL_TABLET | Freq: Three times a day (TID) | ORAL | Status: DC
  Administered 2014-12-29 – 2014-12-30 (×2): 90 mg via ORAL
  Filled 2014-12-29 (×5): qty 1

## 2014-12-29 MED ORDER — CLOPIDOGREL BISULFATE 75 MG PO TABS
75.0000 mg | ORAL_TABLET | Freq: Every day | ORAL | Status: DC
  Administered 2014-12-29 – 2014-12-30 (×2): 75 mg via ORAL
  Filled 2014-12-29 (×2): qty 1

## 2014-12-29 NOTE — Progress Notes (Signed)
Patient had 3 loose stools in the last 24 hrs. C-dif protocol initiated. MD notified. Will continue to monitor.

## 2014-12-29 NOTE — Care Management Note (Signed)
    Page 1 of 1   12/30/2014     3:46:26 PM CARE MANAGEMENT NOTE 12/30/2014  Patient:  [name], [name]   Account Number:  [id number]  Date Initiated:  12/29/2014  Documentation initiated by:  Tomi Bamberger  Subjective/Objective Assessment:   dx acute encephalopathy  admit- lives alone.     Action/Plan:   pt eval- rec snf   Anticipated DC Date:  12/30/2014   Anticipated DC Plan:  SKILLED NURSING FACILITY  In-house referral  Clinical Social Worker      DC Planning Services  CM consult      Choice offered to / List presented to:             Status of service:  Completed, signed off Medicare Important Message given?  YES (If response is "NO", the following Medicare IM given date fields will be blank) Date Medicare IM given:  12/29/2014 Medicare IM given by:  Tomi Bamberger Date Additional Medicare IM given:   Additional Medicare IM given by:    Discharge Disposition:  [place]  Per UR Regulation:  Reviewed for med. necessity/level of care/duration of stay  If discussed at [place] of Stay Meetings, dates discussed:    Comments:  12/30/14 [place], BSN [phone] patient is for dc to snf today, CSW following.

## 2014-12-29 NOTE — Progress Notes (Signed)
[facility] PROGRESS NOTE  [name] UMP:[id number] DOB: 1922-05-11 DOA: 12/27/2014 PCP: [name], MD  Assessment/Plan: 1-Acute encephalopathy -Might be related to progression of dementia with possible TIA.  -MRI negative. Chest x ray negative for infection.  -No fever, no leukocytosis.  -Ammonia level at 16, TSH at 1.2. B 12= 711,  -RPR penidng, Hb A1 c 6.0.  -urine culture no growth  Afib with RVR Intermittently converting to sinus rhythm   On aspirin and plavix now as per cardiology rec's not a candidate for anticoagulation given age and fall hx (CHADs scor 6-7) Continue with metoprolol and Cardizem (last one dose adjusted to 90mg  TID).  Mild elevated troponin; no plans for further work up currently. ECHO w/o wall motion abnormalities and grade 1 diastolic HF.  HTN On metoprolol, and Cardizem.  BP is well controlled   Hypokalemia; continue repletion. -goal is for K > 4.   Glaucoma Continue eye drops.  Diabetes: -A1C 6.0 -continue diet control  Deconditioning: will need SNF at discharge for rehab and care  Code Status: DNR Family Communication: no family at bedside Disposition Plan: Need SNF   Consultants:  Cardiology  Neurology  Procedures:  ECHO;  - Left ventricle: The cavity size was normal. Systolic function was normal. The estimated ejection fraction was in the range of 55% to 60%. Wall motion was normal; there were no regional wall motion abnormalities. Doppler parameters are consistent with abnormal left ventricular relaxation (grade 1 diastolic dysfunction). - Aortic valve: There was trivial regurgitation.  Antibiotics:  none  HPI/Subjective: Alert, very pleasant. No fever. Denies CP or SOB    Objective: Filed Vitals:   12/29/14 1834  BP: 130/86  Pulse: 99  Temp: 97.8 F (36.6 C)  Resp: 24    Intake/Output Summary (Last 24 hours) at 12/29/14 1847 Last data filed at 12/29/14 1714  Gross per 24  hour  Intake    980 ml  Output   1750 ml  Net   -770 ml   Filed Weights   12/28/14 1445  Weight: 72.167 kg (159 lb 1.6 oz)    Exam:   General:  Alert, awake and oriented X 2;  in no distress. Conversant, pleasantly confuse intermittently.   Cardiovascular: atrial flutter with tachycardia and rate in 120-140's; no rubs or gallops   Respiratory: CTA bilaterally  Abdomen: BS present, soft, distended.   Musculoskeletal: no edema  Neuro non focal.   Data Reviewed: Basic Metabolic Panel:  Recent Labs Lab 12/23/14 1949 12/24/14 1220 12/27/14 1003 12/27/14 1556 12/27/14 1910 12/28/14 0333 12/29/14 0535  NA 140 140 140  --   --  142 142  K 3.6 3.7 3.8  --   --  3.2* 3.3*  CL 106 104 105  --   --  109 110  CO2 28 28 26   --   --  28 23  GLUCOSE 135* 88 135*  --   --  135* 138*  BUN 19 24* 11  --   --  8 19  CREATININE 0.91 0.87 0.76 0.72  --  0.67 0.77  CALCIUM 9.4 9.3 9.5  --   --  9.0 9.3  MG  --   --   --   --  2.3  --   --    Liver Function Tests:  Recent Labs Lab 12/27/14 1003 12/28/14 0333  AST 25 24  ALT 25 23  ALKPHOS 79 72  BILITOT 0.8 1.0  PROT 6.5 6.1  ALBUMIN 3.6  3.2*    Recent Labs Lab 01-20-2015 1910  AMMONIA 16   CBC:  Recent Labs Lab 12/23/14 1949 01-20-2015 1003 January 20, 2015 1556 12/28/14 0333  WBC 8.0 7.1 7.4 6.9  NEUTROABS  --  5.8  --   --   HGB 13.4 13.5 14.9 13.5  HCT 40.7 39.8 43.9  45.3 40.3  MCV 94.4 93.6 93.8 94.2  PLT 214 217 267 222   Cardiac Enzymes:  Recent Labs Lab 20-Jan-2015 1003 20-Jan-2015 1556 01-20-2015 1910 12/28/14 0333  TROPONINI <0.03 0.04* 0.10* 0.06*   CBG:  Recent Labs Lab 12/28/14 1652 12/28/14 2104 12/29/14 0810 12/29/14 1151 12/29/14 1657  GLUCAP 122* 112* 125* 129* 117*    Recent Results (from the past 240 hour(s))  Urine culture     Status: None   Collection Time: 01-20-2015  2:30 PM  Result Value Ref Range Status   Specimen Description URINE, RANDOM  Final   Special Requests NONE  Final    Colony Count   Final    30,000 COLONIES/ML Performed at [facility]    Culture   Final    Multiple bacterial morphotypes present, none predominant. Suggest appropriate recollection if clinically indicated. Performed at [facility]    Report Status 12/28/2014 FINAL  Final     Studies: Mr Kizzie Fantasia Contrast  January 20, 2015   CLINICAL DATA:  Initial evaluation for mild confusion. History of meningitis, hyperlipidemia. Remote breast cancer.  EXAM: MRI HEAD WITHOUT AND WITH CONTRAST  MRA HEAD WITHOUT CONTRAST  TECHNIQUE: Multiplanar, multiecho pulse sequences of the brain and surrounding structures were obtained without and with intravenous contrast. Angiographic images of the head were obtained using MRA technique without contrast.  Multiplanar, multiecho pulse sequences of the brain and surrounding structures were obtained without AND WITH intravenous contrast. Angiographic images of the head were obtained using MRA technique without contrast.  CONTRAST:  7mL MULTIHANCE GADOBENATE DIMEGLUMINE 529 MG/ML IV SOLN  COMPARISON:  Prior CT from earlier the same day.  FINDINGS: MRI HEAD FINDINGS  Diffuse prominence of the CSF containing spaces is compatible with generalized cerebral atrophy. Attention confluent T2/FLAIR hyperdensity within the periventricular deep white matter both cerebral hemispheres most consistent with chronic small vessel ischemic changes. Small vessel changes present within the pons as well.  No abnormal foci of restricted diffusion to suggest acute intracranial infarct. Gray-white matter differentiation maintained. No acute or chronic intracranial hemorrhage.  No mass lesion or midline shift. No abnormal enhancement on post-contrast sequences. Ventricular prominence related to global parenchymal volume loss present without hydrocephalus. No extra-axial fluid collection.  Craniocervical junction within normal limits. Pituitary gland normal. No acute abnormality seen about  the orbits.  Scattered mucoperiosteal thickening ethmoid air cells. No air-fluid level to suggest active sinus infection. No mastoid effusion.  Bone marrow signal intensity normal. Scalp soft tissues within normal limits.  MRA HEAD FINDINGS  ANTERIOR CIRCULATION:  Visualized distal cervical segments of the internal carotid arteries are widely patent with antegrade flow. The petrous, cavernous, and supra clinoid segments are widely patent. A1 segments appear to be codominant and patent bilaterally. Anterior communicating artery and anterior cerebral arteries widely patent.  M1 segments well opacified without proximal branch occlusion or hemodynamically significant stenosis. Mild multi focal distal branch irregularity present within the MCA branches bilaterally.  POSTERIOR CIRCULATION:  Left vertebral artery is dominant and widely patent to the level of the vertebrobasilar junction. Diminutive right vertebral artery divides at the right posterior inferior cerebellar artery with a a small  hypoplastic branch ascending towards the vertebrobasilar junction. Posterior inferior cerebral arteries well opacified along their proximal portions. Basilar artery tortuous but widely patent without significant stenosis.  Superior cerebellar arteries are patent proximally, but not well evaluated distally. Right P1 segment well opacified. Multi focal irregularity present within the right P2 segment without focal high-grade stenosis. There is fetal origin of the left posterior cerebral artery with a widely patent left posterior communicating artery. Mild distal branch irregularity present within the left P2 segment without significant stenosis.  IMPRESSION: MRI HEAD IMPRESSION:  1. No acute intracranial infarct or other abnormality identified within the brain. No abnormal enhancement. 2. Generalized cerebral atrophy with chronic microvascular ischemic disease involving the supratentorial white matter and pons.  MRA HEAD IMPRESSION:  1.  No proximal branch occlusion or hemodynamically significant stenosis identified within the intracranial circulation. 2. Fetal origin of the left posterior cerebral artery with widely patent left posterior communicating artery. 3. Mild distal branch atherosclerotic irregularity involving the MCA and PCA branches bilaterally.   Electronically Signed   By: Jeannine Boga M.D.   On: 12/27/2014 21:52   Mr Jodene Nam Head/brain Wo Cm  12/27/2014   CLINICAL DATA:  Initial evaluation for mild confusion. History of meningitis, hyperlipidemia. Remote breast cancer.  EXAM: MRI HEAD WITHOUT AND WITH CONTRAST  MRA HEAD WITHOUT CONTRAST  TECHNIQUE: Multiplanar, multiecho pulse sequences of the brain and surrounding structures were obtained without and with intravenous contrast. Angiographic images of the head were obtained using MRA technique without contrast.  Multiplanar, multiecho pulse sequences of the brain and surrounding structures were obtained without AND WITH intravenous contrast. Angiographic images of the head were obtained using MRA technique without contrast.  CONTRAST:  12mL MULTIHANCE GADOBENATE DIMEGLUMINE 529 MG/ML IV SOLN  COMPARISON:  Prior CT from earlier the same day.  FINDINGS: MRI HEAD FINDINGS  Diffuse prominence of the CSF containing spaces is compatible with generalized cerebral atrophy. Attention confluent T2/FLAIR hyperdensity within the periventricular deep white matter both cerebral hemispheres most consistent with chronic small vessel ischemic changes. Small vessel changes present within the pons as well.  No abnormal foci of restricted diffusion to suggest acute intracranial infarct. Gray-white matter differentiation maintained. No acute or chronic intracranial hemorrhage.  No mass lesion or midline shift. No abnormal enhancement on post-contrast sequences. Ventricular prominence related to global parenchymal volume loss present without hydrocephalus. No extra-axial fluid collection.   Craniocervical junction within normal limits. Pituitary gland normal. No acute abnormality seen about the orbits.  Scattered mucoperiosteal thickening ethmoid air cells. No air-fluid level to suggest active sinus infection. No mastoid effusion.  Bone marrow signal intensity normal. Scalp soft tissues within normal limits.  MRA HEAD FINDINGS  ANTERIOR CIRCULATION:  Visualized distal cervical segments of the internal carotid arteries are widely patent with antegrade flow. The petrous, cavernous, and supra clinoid segments are widely patent. A1 segments appear to be codominant and patent bilaterally. Anterior communicating artery and anterior cerebral arteries widely patent.  M1 segments well opacified without proximal branch occlusion or hemodynamically significant stenosis. Mild multi focal distal branch irregularity present within the MCA branches bilaterally.  POSTERIOR CIRCULATION:  Left vertebral artery is dominant and widely patent to the level of the vertebrobasilar junction. Diminutive right vertebral artery divides at the right posterior inferior cerebellar artery with a a small hypoplastic branch ascending towards the vertebrobasilar junction. Posterior inferior cerebral arteries well opacified along their proximal portions. Basilar artery tortuous but widely patent without significant stenosis.  Superior cerebellar arteries are patent proximally,  but not well evaluated distally. Right P1 segment well opacified. Multi focal irregularity present within the right P2 segment without focal high-grade stenosis. There is fetal origin of the left posterior cerebral artery with a widely patent left posterior communicating artery. Mild distal branch irregularity present within the left P2 segment without significant stenosis.  IMPRESSION: MRI HEAD IMPRESSION:  1. No acute intracranial infarct or other abnormality identified within the brain. No abnormal enhancement. 2. Generalized cerebral atrophy with chronic  microvascular ischemic disease involving the supratentorial white matter and pons.  MRA HEAD IMPRESSION:  1. No proximal branch occlusion or hemodynamically significant stenosis identified within the intracranial circulation. 2. Fetal origin of the left posterior cerebral artery with widely patent left posterior communicating artery. 3. Mild distal branch atherosclerotic irregularity involving the MCA and PCA branches bilaterally.   Electronically Signed   By: Jeannine Boga M.D.   On: 12/27/2014 21:52    Scheduled Meds: . aspirin EC  81 mg Oral Daily  . cholecalciferol  2,000 Units Oral Daily  . clopidogrel  75 mg Oral Daily  . diltiazem  90 mg Oral 3 times per day  . dorzolamide-timolol  1 drop Both Eyes BID  . enoxaparin (LOVENOX) injection  40 mg Subcutaneous Q24H  . lactobacillus acidophilus  1 tablet Oral Daily  . metoprolol  100 mg Oral BID  . pantoprazole  40 mg Oral Daily  . sodium chloride  3 mL Intravenous Q12H  . sodium chloride  3 mL Intravenous Q12H   Continuous Infusions:   Principal Problem:   Acute encephalopathy Active Problems:   Hypertension   GERD (gastroesophageal reflux disease)   Hyperlipidemia   Glaucoma   Word finding difficulty   Atrial flutter, unspecified    Time spent: 30 minutes.     Barton Dubois  [facility] Pager [phone]. If 7PM-7AM, please contact night-coverage at [email or web address], password [facility] 12/29/2014, 6:47 PM  LOS: 2 days

## 2014-12-29 NOTE — Progress Notes (Addendum)
.   Patient Name: [name] Date of Encounter: 12/29/2014     Principal Problem:   Acute encephalopathy Active Problems:   Hypertension   GERD (gastroesophageal reflux disease)   Hyperlipidemia   Glaucoma   Word finding difficulty   Atrial flutter, unspecified    SUBJECTIVE  Pleasantly demented. Denies CP or SOB. She did have some fluttering in her chest last night. NO sx now.   CURRENT MEDS . aspirin EC  81 mg Oral Daily  . cholecalciferol  2,000 Units Oral Daily  . diltiazem  120 mg Oral BID  . dorzolamide-timolol  1 drop Both Eyes BID  . enoxaparin (LOVENOX) injection  40 mg Subcutaneous Q24H  . lactobacillus acidophilus  1 tablet Oral Daily  . metoprolol  100 mg Oral BID  . pantoprazole  40 mg Oral Daily  . sodium chloride  3 mL Intravenous Q12H  . sodium chloride  3 mL Intravenous Q12H    OBJECTIVE  Filed Vitals:   12/28/14 1831 12/28/14 2000 12/28/14 2235 12/29/14 0100  BP: 143/79  160/89 162/89  Pulse: 74   69  Temp: 98.4 F (36.9 C)  97.8 F (36.6 C) 98.2 F (36.8 C)  TempSrc: Oral  Oral Oral  Resp: 17 18 20 20   Height:      Weight:      SpO2: 96%  98% 96%    Intake/Output Summary (Last 24 hours) at 12/29/14 0934 Last data filed at 12/29/14 0926  Gross per 24 hour  Intake    460 ml  Output   1575 ml  Net  -1115 ml   Filed Weights   12/28/14 1445  Weight: 159 lb 1.6 oz (72.167 kg)    PHYSICAL EXAM  General: Pleasant, NAD. Pleasantly demented Neuro: Alert and oriented X 3. Moves all extremities spontaneously. Psych: Normal affect. HEENT:  Normal  Neck: Supple without bruits or JVD. Lungs:  Resp regular and unlabored, CTA. Heart: irreg irreg. Tachy.  no s3, s4, or murmurs. Abdomen: Soft, non-tender, non-distended, BS + x 4.  Extremities: No clubbing, cyanosis or edema. DP/PT/Radials 2+ and equal bilaterally.  Accessory Clinical Findings  CBC  Recent Labs  12/27/14 1003 12/27/14 1556 12/28/14 0333  WBC 7.1 7.4 6.9    NEUTROABS 5.8  --   --   HGB 13.5 14.9 13.5  HCT 39.8 43.9  45.3 40.3  MCV 93.6 93.8 94.2  PLT 217 267 448   Basic Metabolic Panel  Recent Labs  12/27/14 1910 12/28/14 0333 12/29/14 0535  NA  --  142 142  K  --  3.2* 3.3*  CL  --  109 110  CO2  --  28 23  GLUCOSE  --  135* 138*  BUN  --  8 19  CREATININE  --  0.67 0.77  CALCIUM  --  9.0 9.3  MG 2.3  --   --    Liver Function Tests  Recent Labs  12/27/14 1003 12/28/14 0333  AST 25 24  ALT 25 23  ALKPHOS 79 72  BILITOT 0.8 1.0  PROT 6.5 6.1  ALBUMIN 3.6 3.2*   No results for input(s): LIPASE, AMYLASE in the last 72 hours. Cardiac Enzymes  Recent Labs  12/27/14 1556 12/27/14 1910 12/28/14 0333  TROPONINI 0.04* 0.10* 0.06*   BNP Invalid input(s): POCBNP D-Dimer No results for input(s): DDIMER in the last 72 hours. Hemoglobin A1C  Recent Labs  12/28/14 1230  HGBA1C 5.8*   Fasting Lipid Panel  Recent  Labs  12/28/14 0333  CHOL 163  HDL 38*  LDLCALC 102*  TRIG 113  CHOLHDL 4.3   Thyroid Function Tests  Recent Labs  12/27/14 1556  TSH 1.206    TELE  Atrial fib/flutter HRs 120s currently   Radiology/Studies  Dg Chest 2 View  12/27/2014   CLINICAL DATA:  Pt presents to department from home for evaluation of altered mental status. Family states she is "not acting normal." states she has been very confused. History of TIA. Pt denies pain upon arrival to ED. Pt is alert to self, but confused about place and situation  EXAM: CHEST  2 VIEW  COMPARISON:  11/18/2011  FINDINGS: Cardiac silhouette is mildly enlarged. Aorta is mildly uncoiled. No mediastinal or hilar masses.  No lung consolidation or edema. No pleural effusion or pneumothorax.  Status post left mastectomy, stable.  Bony thorax is demineralized but grossly intact.  IMPRESSION: No acute cardiopulmonary disease.   Electronically Signed   By: Lajean Manes M.D.   On: 12/27/2014 11:48   Ct Head Wo Contrast  12/27/2014   CLINICAL DATA:   Hx of Altered mental status Pt is alert and oriented at time of scanPt denies hx of stroke, seizures, Pt states she fell 2 weeks ago and injured LT Leg  Hx VE:LFYBOFBPZWCHENIDPOEU degeneration HTNCA skin, breast Hx MP:[id number] stent placement Hysterectomy  EXAM: CT HEAD WITHOUT CONTRAST  TECHNIQUE: Contiguous axial images were obtained from the base of the skull through the vertex without intravenous contrast.  COMPARISON:  Brain MRI, 08/06/2010 and head CT, 08/05/2010.  FINDINGS: Ventricles normal configuration. There is ventricular and sulcal enlargement 6 with moderate atrophy. There are no parenchymal masses or mass effect. There is no evidence of a recent transcortical infarct. Few small foci of hypoattenuation are noted in the deep white matter invasive. This suggests a small lacunar infarcts. There is patchy white matter hypoattenuation elsewhere consistent mild chronic microvascular ischemic change.  There are no extra-axial masses or abnormal fluid collections.  There is no intracranial hemorrhage.  Small mucous retention cyst in the left maxillary sinus. Mild ethmoid sinus mucosal thickening. Clear mastoid air cells.  IMPRESSION: 1. No acute intracranial abnormalities. 2. Atrophy and mild chronic microvascular ischemic change. No significant change from the prior studies.   Electronically Signed   By: Lajean Manes M.D.   On: 12/27/2014 12:06   Mr Jeri Cos QP Contrast  12/27/2014   CLINICAL DATA:  Initial evaluation for mild confusion. History of meningitis, hyperlipidemia. Remote breast cancer.  EXAM: MRI HEAD WITHOUT AND WITH CONTRAST  MRA HEAD WITHOUT CONTRAST  TECHNIQUE: Multiplanar, multiecho pulse sequences of the brain and surrounding structures were obtained without and with intravenous contrast. Angiographic images of the head were obtained using MRA technique without contrast.  Multiplanar, multiecho pulse sequences of the brain and surrounding structures were obtained without AND WITH  intravenous contrast. Angiographic images of the head were obtained using MRA technique without contrast.  CONTRAST:  37mL MULTIHANCE GADOBENATE DIMEGLUMINE 529 MG/ML IV SOLN  COMPARISON:  Prior CT from earlier the same day.  FINDINGS: MRI HEAD FINDINGS  Diffuse prominence of the CSF containing spaces is compatible with generalized cerebral atrophy. Attention confluent T2/FLAIR hyperdensity within the periventricular deep white matter both cerebral hemispheres most consistent with chronic small vessel ischemic changes. Small vessel changes present within the pons as well.  No abnormal foci of restricted diffusion to suggest acute intracranial infarct. Gray-white matter differentiation maintained. No acute or chronic intracranial hemorrhage.  No  mass lesion or midline shift. No abnormal enhancement on post-contrast sequences. Ventricular prominence related to global parenchymal volume loss present without hydrocephalus. No extra-axial fluid collection.  Craniocervical junction within normal limits. Pituitary gland normal. No acute abnormality seen about the orbits.  Scattered mucoperiosteal thickening ethmoid air cells. No air-fluid level to suggest active sinus infection. No mastoid effusion.  Bone marrow signal intensity normal. Scalp soft tissues within normal limits.  MRA HEAD FINDINGS  ANTERIOR CIRCULATION:  Visualized distal cervical segments of the internal carotid arteries are widely patent with antegrade flow. The petrous, cavernous, and supra clinoid segments are widely patent. A1 segments appear to be codominant and patent bilaterally. Anterior communicating artery and anterior cerebral arteries widely patent.  M1 segments well opacified without proximal branch occlusion or hemodynamically significant stenosis. Mild multi focal distal branch irregularity present within the MCA branches bilaterally.  POSTERIOR CIRCULATION:  Left vertebral artery is dominant and widely patent to the level of the  vertebrobasilar junction. Diminutive right vertebral artery divides at the right posterior inferior cerebellar artery with a a small hypoplastic branch ascending towards the vertebrobasilar junction. Posterior inferior cerebral arteries well opacified along their proximal portions. Basilar artery tortuous but widely patent without significant stenosis.  Superior cerebellar arteries are patent proximally, but not well evaluated distally. Right P1 segment well opacified. Multi focal irregularity present within the right P2 segment without focal high-grade stenosis. There is fetal origin of the left posterior cerebral artery with a widely patent left posterior communicating artery. Mild distal branch irregularity present within the left P2 segment without significant stenosis.  IMPRESSION: MRI HEAD IMPRESSION:  1. No acute intracranial infarct or other abnormality identified within the brain. No abnormal enhancement. 2. Generalized cerebral atrophy with chronic microvascular ischemic disease involving the supratentorial white matter and pons.  MRA HEAD IMPRESSION:  1. No proximal branch occlusion or hemodynamically significant stenosis identified within the intracranial circulation. 2. Fetal origin of the left posterior cerebral artery with widely patent left posterior communicating artery. 3. Mild distal branch atherosclerotic irregularity involving the MCA and PCA branches bilaterally.   Electronically Signed   By: Jeannine Boga M.D.   On: 12/27/2014 21:52   Mr Jodene Nam Head/brain Wo Cm  12/27/2014   CLINICAL DATA:  Initial evaluation for mild confusion. History of meningitis, hyperlipidemia. Remote breast cancer.  EXAM: MRI HEAD WITHOUT AND WITH CONTRAST  MRA HEAD WITHOUT CONTRAST  TECHNIQUE: Multiplanar, multiecho pulse sequences of the brain and surrounding structures were obtained without and with intravenous contrast. Angiographic images of the head were obtained using MRA technique without contrast.   Multiplanar, multiecho pulse sequences of the brain and surrounding structures were obtained without AND WITH intravenous contrast. Angiographic images of the head were obtained using MRA technique without contrast.  CONTRAST:  70mL MULTIHANCE GADOBENATE DIMEGLUMINE 529 MG/ML IV SOLN  COMPARISON:  Prior CT from earlier the same day.  FINDINGS: MRI HEAD FINDINGS  Diffuse prominence of the CSF containing spaces is compatible with generalized cerebral atrophy. Attention confluent T2/FLAIR hyperdensity within the periventricular deep white matter both cerebral hemispheres most consistent with chronic small vessel ischemic changes. Small vessel changes present within the pons as well.  No abnormal foci of restricted diffusion to suggest acute intracranial infarct. Gray-white matter differentiation maintained. No acute or chronic intracranial hemorrhage.  No mass lesion or midline shift. No abnormal enhancement on post-contrast sequences. Ventricular prominence related to global parenchymal volume loss present without hydrocephalus. No extra-axial fluid collection.  Craniocervical junction within normal limits. Pituitary  gland normal. No acute abnormality seen about the orbits.  Scattered mucoperiosteal thickening ethmoid air cells. No air-fluid level to suggest active sinus infection. No mastoid effusion.  Bone marrow signal intensity normal. Scalp soft tissues within normal limits.  MRA HEAD FINDINGS  ANTERIOR CIRCULATION:  Visualized distal cervical segments of the internal carotid arteries are widely patent with antegrade flow. The petrous, cavernous, and supra clinoid segments are widely patent. A1 segments appear to be codominant and patent bilaterally. Anterior communicating artery and anterior cerebral arteries widely patent.  M1 segments well opacified without proximal branch occlusion or hemodynamically significant stenosis. Mild multi focal distal branch irregularity present within the MCA branches bilaterally.   POSTERIOR CIRCULATION:  Left vertebral artery is dominant and widely patent to the level of the vertebrobasilar junction. Diminutive right vertebral artery divides at the right posterior inferior cerebellar artery with a a small hypoplastic branch ascending towards the vertebrobasilar junction. Posterior inferior cerebral arteries well opacified along their proximal portions. Basilar artery tortuous but widely patent without significant stenosis.  Superior cerebellar arteries are patent proximally, but not well evaluated distally. Right P1 segment well opacified. Multi focal irregularity present within the right P2 segment without focal high-grade stenosis. There is fetal origin of the left posterior cerebral artery with a widely patent left posterior communicating artery. Mild distal branch irregularity present within the left P2 segment without significant stenosis.  IMPRESSION: MRI HEAD IMPRESSION:  1. No acute intracranial infarct or other abnormality identified within the brain. No abnormal enhancement. 2. Generalized cerebral atrophy with chronic microvascular ischemic disease involving the supratentorial white matter and pons.  MRA HEAD IMPRESSION:  1. No proximal branch occlusion or hemodynamically significant stenosis identified within the intracranial circulation. 2. Fetal origin of the left posterior cerebral artery with widely patent left posterior communicating artery. 3. Mild distal branch atherosclerotic irregularity involving the MCA and PCA branches bilaterally.   Electronically Signed   By: Jeannine Boga M.D.   On: 12/27/2014 21:52    ASSESSMENT AND PLAN  Paylin MAKHIYA COBURN is a 81 y.o. female with a medical history of CAD s/p PCI in the 90s, hypertension, TIA, meningitis, who presented 12/27/14 with altered mental status. Cardiology was consulted last night for new onset atrial flutter.  New onset Atrial Flutter -- Spontaneously converted back to sinus rhythm shortly after  admission but now back in Atrial fib/flutter HRs 120s currently. HRs have not been adequately controlled. She is completely asymptomatic currently -- Home meds include metoprolol XL 100mg  BID and diltiazem 120mg  BID. Continue these for now. -- Would add amiodarone-- will defer to MD to start. -- TTE yesterday with EF 55-60%, no RWMAs and G1DD. -- CHADSVASC score at least 7: (HTN 1, age 18, female 47, TIA 2, HTN 1) -- With age 25, she may not be candidate for long term anticoagulation with 2 falls in the past 6 - 8 months.   CAD- s/p remote stenting -- Troponin slightly elevated and down trending 0.10--> 0.06. Likely demand ischemia in the setting of Aflutter. -- Continue aspirin 81mg . Prior PCI but likely just medical management here.  AMS- Non focal neuro-exam and unremarkable MRI/MRA brain as well as unimpressive serologies and UA.  UDS negative. Neuro signed off.  -- Concern for underlying dementia.  Hypokalemia- supplement.  HTN- well controlled on current regimen  Dispo- SHE IS A DNR. She will be discharged to a SNF   Signed, Eileen Stanford PA-C  Pager [phone]    Patient seen and examined. Agree  with assessment and plan. No chest pain. Recurrent AF today; rate currently ~110.  K 3.3; need to replete,  Mg 2.3. May not be good candidate for NOAC or warfarin. With CAD will add plavix 75 mg daily for additional anti-platelet benefit c/w ASA alone. Will change cardizem from 120 mg bid to 90 mg every 8 hrs for improved rate control   Troy Sine, MD, [facility] 12/29/2014 2:05 PM

## 2014-12-29 NOTE — Progress Notes (Signed)
Patient is in [place] with short periods of a. Flutter that shows up on monitor. Patient has history of a. Fib and flutter. Will pass on to oncoming shift to notify provider.

## 2014-12-29 NOTE — Clinical Social Work Psychosocial (Signed)
Clinical Social Work Department BRIEF PSYCHOSOCIAL ASSESSMENT 12/29/2014  Patient:  [name], [name]     Account Number:  [id number]     Admit date:  12/27/2014  Clinical Social Worker:  Lovey Newcomer  Date/Time:  12/28/2014 05:00 PM  Referred by:  Physician  Date Referred:  12/29/2014 Referred for  SNF Placement   Other Referral:   NA   Interview type:  Family Other interview type:   Patient's granddaughter interviewed by phone as patient care taking place at time of assessment.    PSYCHOSOCIAL DATA Living Status:  ALONE Admitted from facility:   Level of care:   Primary support name:  [name] Primary support relationship to patient:  FAMILY Degree of support available:   Support is good.    CURRENT CONCERNS Current Concerns  Post-Acute Placement   Other Concerns:   NA    SOCIAL WORK ASSESSMENT / PLAN CSW spoke with patient's granddaughter by phone to complete assessment. Per granddaughter [name] patient is from home where she has been able to complete her ADLs independently PTA. Patient's granddaughter states that she will discuss possiblility of SNF placement at discharge with patient and she herself agrees this might be needed. CSW explained SNF search/placement process and answered questions. [name] sounded calm and was engaged in assessment.   Assessment/plan status:  Psychosocial Support/Ongoing Assessment of Needs Other assessment/ plan:   Complete FL2, Fax, PASRR   Information/referral to community resources:   CSW contact information and SNF list given (left with Network engineer at front desk).    PATIENT'S/FAMILY'S RESPONSE TO PLAN OF CARE: Patient's granddaughter is agreeable to placement if necessary but would like to discuss this with the patient. CSW will follow up with available bed offers.       Liz Beach MSW, [place], [place], [phone]

## 2014-12-29 NOTE — Clinical Social Work Note (Signed)
CSW spoke with granddaughter by phone. She will have SNF decision made with patient by tomorrow morning.    Liz Beach MSW, [place], [place], [phone]

## 2014-12-29 NOTE — Clinical Social Work Placement (Signed)
Clinical Social Work Department CLINICAL SOCIAL WORK PLACEMENT NOTE 12/29/2014  Patient:  NGUYEN, BUTLER  Account Number:  [id number] Admit date:  12/27/2014  Clinical Social Worker:  Lovey Newcomer  Date/time:  12/29/2014 07:50 AM  Clinical Social Work is seeking post-discharge placement for this patient at the following level of care:   SKILLED NURSING   (*CSW will update this form in Epic as items are completed)   12/29/2014  Patient/family provided with [place] Department of Clinical Social Work's list of facilities offering this level of care within the geographic area requested by the patient (or if unable, by the patient's family).  12/29/2014  Patient/family informed of their freedom to choose among providers that offer the needed level of care, that participate in Medicare, Medicaid or managed care program needed by the patient, have an available bed and are willing to accept the patient.  12/29/2014  Patient/family informed of [facility]' ownership interest in [facility], as well as of the fact that they are under no obligation to receive care at this facility.  PASARR submitted to EDS on 12/29/2014 PASARR number received on 12/29/2014  FL2 transmitted to all facilities in geographic area requested by pt/family on  12/29/2014 FL2 transmitted to all facilities within larger geographic area on   Patient informed that his/her managed care company has contracts with or will negotiate with  certain facilities, including the following:     Patient/family informed of bed offers received:   Patient chooses bed at  Physician recommends and patient chooses bed at    Patient to be transferred to  on   Patient to be transferred to facility by  Patient and family notified of transfer on  Name of family member notified:    The following physician request were entered in Epic:   Additional Comments:    Liz Beach MSW, [place], [place],  [phone]

## 2014-12-29 NOTE — Progress Notes (Signed)
Physical Therapy Treatment Patient Details Name: TOYOKO SILOS MRN: [id number] DOB: February 11, 1922 Today's Date: 12/29/2014    History of Present Illness Pt presents from home with AMS. PMH: glaucoma left eye, breast cancer, HTN, meningitis, TIA    PT Comments    Progressing steadily.  Much clearer of thought.  Generally steady gait with noticeable fatigue.  Follow Up Recommendations  SNF;Supervision/Assistance - 24 hour     Equipment Recommendations  None recommended by PT    Recommendations for Other Services       Precautions / Restrictions Precautions Precautions: Fall Restrictions Weight Bearing Restrictions: No Other Position/Activity Restrictions: blindness left eye    Mobility  Bed Mobility                  Transfers Overall transfer level: Needs assistance Equipment used: Rolling walker (2 wheeled) Transfers: Sit to/from Stand;Stand Pivot Transfers Sit to Stand: Supervision Stand pivot transfers: Supervision       General transfer comment: cues for hand placement, o/w no assist needed, supervision for safety  Ambulation/Gait Ambulation/Gait assistance: Supervision Ambulation Distance (Feet): 200 Feet Assistive device: Rolling walker (2 wheeled) Gait Pattern/deviations: Step-through pattern Gait velocity: slow   General Gait Details: generally safe, noticeable fatigue and then degraded use fo the RW   Stairs            Wheelchair Mobility    Modified Rankin (Stroke Patients Only)       Balance Overall balance assessment: Needs assistance Sitting-balance support: No upper extremity supported Sitting balance-Leahy Scale: Fair     Standing balance support: No upper extremity supported Standing balance-Leahy Scale: Fair Standing balance comment: based of standing while washing hands with soap and water.                    Cognition Arousal/Alertness: Awake/alert Behavior During Therapy: WFL for tasks  assessed/performed Overall Cognitive Status: Within Functional Limits for tasks assessed         Following Commands: Follows one step commands consistently;Follows one step commands with increased time            Exercises      General Comments        Pertinent Vitals/Pain Pain Assessment: No/denies pain    Home Living                      Prior Function            PT Goals (current goals can now be found in the care plan section) Acute Rehab PT Goals Patient Stated Goal: I'll do what I need to get better PT Goal Formulation: With patient Time For Goal Achievement: 01/10/15 Potential to Achieve Goals: Good Progress towards PT goals: Progressing toward goals    Frequency  Min 3X/week    PT Plan Current plan remains appropriate    Co-evaluation             End of Session   Activity Tolerance: Patient tolerated treatment well Patient left: in chair;with call bell/phone within reach     Time: 3662-9476 PT Time Calculation (min) (ACUTE ONLY): 24 min  Charges:  $Gait Training: 8-22 mins $Therapeutic Activity: 8-22 mins                    G Codes:      Delbra Zellars, Tessie Fass 12/29/2014, 1:23 PM  12/29/2014  Donnella Sham, PT [phone]  (pager)

## 2014-12-30 ENCOUNTER — Inpatient Hospital Stay (HOSPITAL_COMMUNITY): Payer: Medicare Other

## 2014-12-30 DIAGNOSIS — E119 Type 2 diabetes mellitus without complications: Secondary | ICD-10-CM

## 2014-12-30 DIAGNOSIS — R5381 Other malaise: Secondary | ICD-10-CM

## 2014-12-30 LAB — BASIC METABOLIC PANEL
ANION GAP: 7 (ref 5–15)
BUN: 25 mg/dL — ABNORMAL HIGH (ref 6–23)
CO2: 24 mmol/L (ref 19–32)
CREATININE: 0.85 mg/dL (ref 0.50–1.10)
Calcium: 9.3 mg/dL (ref 8.4–10.5)
Chloride: 109 mmol/L (ref 96–112)
GFR calc Af Amer: 68 mL/min — ABNORMAL LOW (ref >90)
GFR calc non Af Amer: 59 mL/min — ABNORMAL LOW (ref >90)
Glucose, Bld: 123 mg/dL — ABNORMAL HIGH (ref 70–99)
POTASSIUM: 4.1 mmol/L (ref 3.5–5.1)
Sodium: 140 mmol/L (ref 135–145)

## 2014-12-30 LAB — GLUCOSE, CAPILLARY
GLUCOSE-CAPILLARY: 112 mg/dL — AB (ref 70–99)
Glucose-Capillary: 111 mg/dL — ABNORMAL HIGH (ref 70–99)

## 2014-12-30 LAB — CLOSTRIDIUM DIFFICILE BY PCR: Toxigenic C. Difficile by PCR: NEGATIVE

## 2014-12-30 MED ORDER — DILTIAZEM HCL ER COATED BEADS 300 MG PO CP24: 300.0000 mg | ORAL_CAPSULE | Freq: Every day | ORAL | Status: DC

## 2014-12-30 MED ORDER — BISMUTH SUBSALICYLATE 262 MG/15ML PO SUSP
30.0000 mL | Freq: Three times a day (TID) | ORAL | Status: DC
  Filled 2014-12-30: qty 236

## 2014-12-30 MED ORDER — TRAMADOL HCL 50 MG PO TABS: 50.0000 mg | ORAL_TABLET | Freq: Four times a day (QID) | ORAL | Status: DC

## 2014-12-30 MED ORDER — CLOPIDOGREL BISULFATE 75 MG PO TABS: 75.0000 mg | ORAL_TABLET | Freq: Every day | ORAL | Status: DC

## 2014-12-30 MED ORDER — SACCHAROMYCES BOULARDII 250 MG PO CAPS: 250.0000 mg | ORAL_CAPSULE | Freq: Two times a day (BID) | ORAL | Status: DC

## 2014-12-30 MED ORDER — METOPROLOL SUCCINATE ER 100 MG PO TB24: 100.0000 mg | ORAL_TABLET | Freq: Two times a day (BID) | ORAL | Status: DC

## 2014-12-30 MED ORDER — DILTIAZEM HCL ER COATED BEADS 300 MG PO CP24
300.0000 mg | ORAL_CAPSULE | Freq: Every day | ORAL | Status: DC
  Administered 2014-12-30: 300 mg via ORAL
  Filled 2014-12-30: qty 1

## 2014-12-30 NOTE — Clinical Social Work Placement (Signed)
Clinical Social Work Department CLINICAL SOCIAL WORK PLACEMENT NOTE 12/30/2014  Patient:  [name], [name]  Account Number:  [id number] Admit date:  12/27/2014  Clinical Social Worker:  Lovey Newcomer  Date/time:  12/29/2014 07:50 AM  Clinical Social Work is seeking post-discharge placement for this patient at the following level of care:   SKILLED NURSING   (*CSW will update this form in Epic as items are completed)   12/29/2014  Patient/family provided with [place] Department of Clinical Social Work's list of facilities offering this level of care within the geographic area requested by the patient (or if unable, by the patient's family).  12/29/2014  Patient/family informed of their freedom to choose among providers that offer the needed level of care, that participate in Medicare, Medicaid or managed care program needed by the patient, have an available bed and are willing to accept the patient.  12/29/2014  Patient/family informed of [facility]' ownership interest in [facility], as well as of the fact that they are under no obligation to receive care at this facility.  PASARR submitted to EDS on 12/29/2014 PASARR number received on 12/29/2014  FL2 transmitted to all facilities in geographic area requested by pt/family on  12/29/2014 FL2 transmitted to all facilities within larger geographic area on   Patient informed that his/her managed care company has contracts with or will negotiate with  certain facilities, including the following:     Patient/family informed of bed offers received:  12/29/2014 Patient chooses bed at [facility] Physician recommends and patient chooses bed at    Patient to be transferred to [facility] on  12/30/2014 Patient to be transferred to facility by Ambulance Patient and family notified of transfer on 12/30/2014 Name of family member notified:  [name]  The following  physician request were entered in Epic:   Additional Comments:    Per MD patient ready for DC to [place] [facility], patient, patient's family, and facility notified of DC. RN given number for report. DC packet on chart. [facility] transport requested for patient. CSW signing off.    Liz Beach MSW, [place], [place], [phone]

## 2014-12-30 NOTE — Progress Notes (Signed)
Called report to receiving nurse at [facility]. Pt. Is ready for transport. Removed IV and tele. Box and placed on pt. Gown. Awaiting [facility]AR to transport. Informed nurse about blood pressure, DM, pt. C/o of diarrhea and minor redness on bottom. Eye drops placed in pt. Packet. Pt. Family aware of pt. Being transported. All needs met. No further needs noted at this time.  

## 2014-12-30 NOTE — Discharge Summary (Signed)
Physician Discharge Summary  [name] QHU:[id number] DOB: 09-28-1922 DOA: 12/27/2014  PCP: [name], MD  Admit date: 12/27/2014 Discharge date: 12/30/2014  Time spent: 40 minutes  Recommendations for Outpatient Follow-up:  1. Follow up with nursing home M.D. 2. Toprol-XL dose decreased from 200 to 100 twice a day. 3. Continue to monitor blood pressure and renal function closely as patient is on multiple blood pressure medications.  Discharge Diagnoses:  Principal Problem:   Acute encephalopathy Active Problems:   GERD (gastroesophageal reflux disease)   Hyperlipidemia   Glaucoma   Word finding difficulty   Atrial flutter, unspecified   Essential hypertension   Type 2 diabetes mellitus without complication   Physical deconditioning   Discharge Condition: Stable  Diet recommendation: Heart healthy diet  Filed Weights   12/28/14 1445  Weight: 72.167 kg (159 lb 1.6 oz)    History of present illness:  [name] is a 81 y.o. female, with a past medical history of TIA, CAD status post PCI in the 1990s, meningitis, hypertension, hyperlipidemia, and breast cancer. He presents to the emergency department with altered mental status. In reviewing the notes I see that the patient has had difficulty with elevated blood pressure lately and has been seen in the emergency department and by her PCP with blood pressures in the 206/100-180/106 range. The patient tells me her PCP has adjusted her blood pressure medications recently. It is difficult to gather history from Ms. Wolfson as she is having trouble with word finding, and she is mildly confused. The EDP mentioned she was licking the newspaper and spitting on her furniture prior to coming in. She does not have dementia or previous history of such difficulties. The patient clearly tells me is that she has urinary frequency. She denies any pain or foul odor in her urine. Her granddaughter, [name], stands outside the  room crying. She tells me that she is her grandmother23 only caretaker and that she is unable to take her home. [name] will be admitted for altered mental status and stroke rule out.  Hospital Course:   Acute encephalopathy -Might be related to progression of dementia with possible TIA.  -MRI negative. Chest x ray negative for infection.  -No fever, no leukocytosis.  -Ammonia level at 16, TSH at 1.2. B 12= 711,  -RPR penidng, Hb A1 c 6.0.  -urine culture no growth. -This is resolved prior to discharge, unclear etiology.  Afib with RVR, Paroxysmal  Intermittently converting to sinus rhythm Cardiology recommended to add Plavix to aspirin.  not a candidate for anticoagulation given age and fall hx (CHADs scor 6-7) Continue with metoprolol and Cardizem (last one dose adjusted to 90mg  TID).  Mild elevated troponin; no plans for further work up currently. ECHO w/o wall motion abnormalities and grade 1 diastolic HF.  Malignant HTN On Toprol-XL, Cozaar, Imdur, minoxidil and Bumex.  Toprol-XL dose decreased to 100 instead of 200 twice a day prior to discharge.  Patient blood pressure is controlled prior to discharge.  Hypokalemia; continue repletion. -goal is for K > 4.   Glaucoma Continue eye drops.  Diabetes: -A1C 6.0 -continue diet control  Deconditioning:Seen by PT/OT and recommended to SNF for acute rehabilitation.    Procedures:  None  Consultations:  Cards  Discharge Exam: Filed Vitals:   12/30/14 1318  BP: 143/74  Pulse: 71  Temp: 97.9 F (36.6 C)  Resp: 18   General: Alert and awake, not in any acute distress. HEENT: anicteric sclera, pupils reactive to  light and accommodation, EOMI CVS: S1-S2 clear, no murmur rubs or gallops Chest: clear to auscultation bilaterally, no wheezing, rales or rhonchi Abdomen: soft nontender, nondistended, normal bowel sounds, no organomegaly Extremities: no cyanosis, clubbing or edema noted bilaterally Neuro:  Cranial nerves II-XII intact, no focal neurological deficits   Discharge Instructions   Discharge Instructions    Diet - low sodium heart healthy    Complete by:  As directed      Increase activity slowly    Complete by:  As directed           Current Discharge Medication List    START taking these medications   Details  clopidogrel (PLAVIX) 75 MG tablet Take 1 tablet (75 mg total) by mouth daily.    diltiazem (CARDIZEM CD) 300 MG 24 hr capsule Take 1 capsule (300 mg total) by mouth daily.    saccharomyces boulardii (FLORASTOR) 250 MG capsule Take 1 capsule (250 mg total) by mouth 2 (two) times daily.      CONTINUE these medications which have CHANGED   Details  metoprolol (TOPROL-XL) 100 MG 24 hr tablet Take 1 tablet (100 mg total) by mouth 2 (two) times daily.      CONTINUE these medications which have NOT CHANGED   Details  ALPHA LIPOIC ACID PO Take 200 mg by mouth daily.      aspirin EC 81 MG tablet Take 81 mg by mouth daily.      B Complex-C (B-COMPLEX WITH VITAMIN C) tablet Take 1 tablet by mouth daily.      bumetanide (BUMEX) 2 MG tablet Take 1 tablet by mouth  daily for blood pressure  and fluid Qty: 90 tablet, Refills: 1    CHOLECALCIFEROL PO Take 9,000 Units by mouth daily.    dorzolamide-timolol (COSOPT) 22.3-6.8 MG/ML ophthalmic solution Place 1 drop into both eyes 2 (two) times daily. Refills: 0    Garlic 3235 MG CAPS Take 1,000 mg by mouth daily.    Iodine, Kelp, (KELP PO) Take 1 tablet by mouth daily.      isosorbide mononitrate (IMDUR) 30 MG 24 hr tablet Take 1 tablet by mouth  every night at bedtime Qty: 90 tablet, Refills: 1    latanoprost (XALATAN) 0.005 % ophthalmic solution Place 1 drop into both eyes at bedtime.      losartan (COZAAR) 100 MG tablet Take 50 mg by mouth 2 (two) times daily.    minoxidil (LONITEN) 2.5 MG tablet Take 1 to 2 tablets daily for BP. Qty: 60 tablet, Refills: 1    OVER THE COUNTER MEDICATION Take 1 capsule by  mouth every evening. Herbavision. Hold while in hospital     tiZANidine (ZANAFLEX) 4 MG tablet 1/2 to 1 tablet 3 x day if needed for muscle spasm if needed Qty: 90 tablet, Refills: 0    traMADol (ULTRAM) 50 MG tablet Take 50 mg by mouth 4 (four) times daily.    Vitamins-Lipotropics (B-50 COMPLEX) TABS Take 1 tablet by mouth daily.    Zinc 50 MG TABS Take 75 mg by mouth daily.      STOP taking these medications     diltiazem (CARDIZEM) 120 MG tablet      Lactobacillus (ACIDOPHILUS) CAPS capsule      meloxicam (MOBIC) 15 MG tablet        Allergies  Allergen Reactions  . Lactose Intolerance (Gi) Other (See Comments)    unknown      The results of significant diagnostics from this hospitalization (including  imaging, microbiology, ancillary and laboratory) are listed below for reference.    Significant Diagnostic Studies: Dg Chest 2 View  12/27/2014   CLINICAL DATA:  Pt presents to department from home for evaluation of altered mental status. Family states she is "not acting normal." states she has been very confused. History of TIA. Pt denies pain upon arrival to ED. Pt is alert to self, but confused about place and situation  EXAM: CHEST  2 VIEW  COMPARISON:  11/18/2011  FINDINGS: Cardiac silhouette is mildly enlarged. Aorta is mildly uncoiled. No mediastinal or hilar masses.  No lung consolidation or edema. No pleural effusion or pneumothorax.  Status post left mastectomy, stable.  Bony thorax is demineralized but grossly intact.  IMPRESSION: No acute cardiopulmonary disease.   Electronically Signed   By: Lajean Manes M.D.   On: 12/27/2014 11:48   Dg Abd 1 View  12/30/2014   CLINICAL DATA:  Diarrhea.  EXAM: ABDOMEN - 1 VIEW  COMPARISON:  Radiograph dated 09/09/2008  FINDINGS: The bowel gas pattern is normal. No radio-opaque calculi or other significant radiographic abnormality are seen. No acute osseous abnormality. Severe arthritis of the left hip.  IMPRESSION: Benign appearing  abdomen.   Electronically Signed   By: Rozetta Nunnery M.D.   On: 12/30/2014 12:47   Ct Head Wo Contrast  12/27/2014   CLINICAL DATA:  Hx of Altered mental status Pt is alert and oriented at time of scanPt denies hx of stroke, seizures, Pt states she fell 2 weeks ago and injured LT Leg  Hx QI:[id number] degeneration HTNCA skin, breast Hx JO:[id number] stent placement Hysterectomy  EXAM: CT HEAD WITHOUT CONTRAST  TECHNIQUE: Contiguous axial images were obtained from the base of the skull through the vertex without intravenous contrast.  COMPARISON:  Brain MRI, 08/06/2010 and head CT, 08/05/2010.  FINDINGS: Ventricles normal configuration. There is ventricular and sulcal enlargement 6 with moderate atrophy. There are no parenchymal masses or mass effect. There is no evidence of a recent transcortical infarct. Few small foci of hypoattenuation are noted in the deep white matter invasive. This suggests a small lacunar infarcts. There is patchy white matter hypoattenuation elsewhere consistent mild chronic microvascular ischemic change.  There are no extra-axial masses or abnormal fluid collections.  There is no intracranial hemorrhage.  Small mucous retention cyst in the left maxillary sinus. Mild ethmoid sinus mucosal thickening. Clear mastoid air cells.  IMPRESSION: 1. No acute intracranial abnormalities. 2. Atrophy and mild chronic microvascular ischemic change. No significant change from the prior studies.   Electronically Signed   By: Lajean Manes M.D.   On: 12/27/2014 12:06   Mr Jeri Cos UX Contrast  12/27/2014   CLINICAL DATA:  Initial evaluation for mild confusion. History of meningitis, hyperlipidemia. Remote breast cancer.  EXAM: MRI HEAD WITHOUT AND WITH CONTRAST  MRA HEAD WITHOUT CONTRAST  TECHNIQUE: Multiplanar, multiecho pulse sequences of the brain and surrounding structures were obtained without and with intravenous contrast. Angiographic images of the head were obtained using MRA  technique without contrast.  Multiplanar, multiecho pulse sequences of the brain and surrounding structures were obtained without AND WITH intravenous contrast. Angiographic images of the head were obtained using MRA technique without contrast.  CONTRAST:  84mL MULTIHANCE GADOBENATE DIMEGLUMINE 529 MG/ML IV SOLN  COMPARISON:  Prior CT from earlier the same day.  FINDINGS: MRI HEAD FINDINGS  Diffuse prominence of the CSF containing spaces is compatible with generalized cerebral atrophy. Attention confluent T2/FLAIR hyperdensity within the periventricular deep white matter  both cerebral hemispheres most consistent with chronic small vessel ischemic changes. Small vessel changes present within the pons as well.  No abnormal foci of restricted diffusion to suggest acute intracranial infarct. Gray-white matter differentiation maintained. No acute or chronic intracranial hemorrhage.  No mass lesion or midline shift. No abnormal enhancement on post-contrast sequences. Ventricular prominence related to global parenchymal volume loss present without hydrocephalus. No extra-axial fluid collection.  Craniocervical junction within normal limits. Pituitary gland normal. No acute abnormality seen about the orbits.  Scattered mucoperiosteal thickening ethmoid air cells. No air-fluid level to suggest active sinus infection. No mastoid effusion.  Bone marrow signal intensity normal. Scalp soft tissues within normal limits.  MRA HEAD FINDINGS  ANTERIOR CIRCULATION:  Visualized distal cervical segments of the internal carotid arteries are widely patent with antegrade flow. The petrous, cavernous, and supra clinoid segments are widely patent. A1 segments appear to be codominant and patent bilaterally. Anterior communicating artery and anterior cerebral arteries widely patent.  M1 segments well opacified without proximal branch occlusion or hemodynamically significant stenosis. Mild multi focal distal branch irregularity present within  the MCA branches bilaterally.  POSTERIOR CIRCULATION:  Left vertebral artery is dominant and widely patent to the level of the vertebrobasilar junction. Diminutive right vertebral artery divides at the right posterior inferior cerebellar artery with a a small hypoplastic branch ascending towards the vertebrobasilar junction. Posterior inferior cerebral arteries well opacified along their proximal portions. Basilar artery tortuous but widely patent without significant stenosis.  Superior cerebellar arteries are patent proximally, but not well evaluated distally. Right P1 segment well opacified. Multi focal irregularity present within the right P2 segment without focal high-grade stenosis. There is fetal origin of the left posterior cerebral artery with a widely patent left posterior communicating artery. Mild distal branch irregularity present within the left P2 segment without significant stenosis.  IMPRESSION: MRI HEAD IMPRESSION:  1. No acute intracranial infarct or other abnormality identified within the brain. No abnormal enhancement. 2. Generalized cerebral atrophy with chronic microvascular ischemic disease involving the supratentorial white matter and pons.  MRA HEAD IMPRESSION:  1. No proximal branch occlusion or hemodynamically significant stenosis identified within the intracranial circulation. 2. Fetal origin of the left posterior cerebral artery with widely patent left posterior communicating artery. 3. Mild distal branch atherosclerotic irregularity involving the MCA and PCA branches bilaterally.   Electronically Signed   By: Jeannine Boga M.D.   On: 12/27/2014 21:52   Mr Jodene Nam Head/brain Wo Cm  12/27/2014   CLINICAL DATA:  Initial evaluation for mild confusion. History of meningitis, hyperlipidemia. Remote breast cancer.  EXAM: MRI HEAD WITHOUT AND WITH CONTRAST  MRA HEAD WITHOUT CONTRAST  TECHNIQUE: Multiplanar, multiecho pulse sequences of the brain and surrounding structures were obtained  without and with intravenous contrast. Angiographic images of the head were obtained using MRA technique without contrast.  Multiplanar, multiecho pulse sequences of the brain and surrounding structures were obtained without AND WITH intravenous contrast. Angiographic images of the head were obtained using MRA technique without contrast.  CONTRAST:  86mL MULTIHANCE GADOBENATE DIMEGLUMINE 529 MG/ML IV SOLN  COMPARISON:  Prior CT from earlier the same day.  FINDINGS: MRI HEAD FINDINGS  Diffuse prominence of the CSF containing spaces is compatible with generalized cerebral atrophy. Attention confluent T2/FLAIR hyperdensity within the periventricular deep white matter both cerebral hemispheres most consistent with chronic small vessel ischemic changes. Small vessel changes present within the pons as well.  No abnormal foci of restricted diffusion to suggest acute intracranial infarct. Gray-white  matter differentiation maintained. No acute or chronic intracranial hemorrhage.  No mass lesion or midline shift. No abnormal enhancement on post-contrast sequences. Ventricular prominence related to global parenchymal volume loss present without hydrocephalus. No extra-axial fluid collection.  Craniocervical junction within normal limits. Pituitary gland normal. No acute abnormality seen about the orbits.  Scattered mucoperiosteal thickening ethmoid air cells. No air-fluid level to suggest active sinus infection. No mastoid effusion.  Bone marrow signal intensity normal. Scalp soft tissues within normal limits.  MRA HEAD FINDINGS  ANTERIOR CIRCULATION:  Visualized distal cervical segments of the internal carotid arteries are widely patent with antegrade flow. The petrous, cavernous, and supra clinoid segments are widely patent. A1 segments appear to be codominant and patent bilaterally. Anterior communicating artery and anterior cerebral arteries widely patent.  M1 segments well opacified without proximal branch occlusion or  hemodynamically significant stenosis. Mild multi focal distal branch irregularity present within the MCA branches bilaterally.  POSTERIOR CIRCULATION:  Left vertebral artery is dominant and widely patent to the level of the vertebrobasilar junction. Diminutive right vertebral artery divides at the right posterior inferior cerebellar artery with a a small hypoplastic branch ascending towards the vertebrobasilar junction. Posterior inferior cerebral arteries well opacified along their proximal portions. Basilar artery tortuous but widely patent without significant stenosis.  Superior cerebellar arteries are patent proximally, but not well evaluated distally. Right P1 segment well opacified. Multi focal irregularity present within the right P2 segment without focal high-grade stenosis. There is fetal origin of the left posterior cerebral artery with a widely patent left posterior communicating artery. Mild distal branch irregularity present within the left P2 segment without significant stenosis.  IMPRESSION: MRI HEAD IMPRESSION:  1. No acute intracranial infarct or other abnormality identified within the brain. No abnormal enhancement. 2. Generalized cerebral atrophy with chronic microvascular ischemic disease involving the supratentorial white matter and pons.  MRA HEAD IMPRESSION:  1. No proximal branch occlusion or hemodynamically significant stenosis identified within the intracranial circulation. 2. Fetal origin of the left posterior cerebral artery with widely patent left posterior communicating artery. 3. Mild distal branch atherosclerotic irregularity involving the MCA and PCA branches bilaterally.   Electronically Signed   By: Jeannine Boga M.D.   On: 12/27/2014 21:52    Microbiology: Recent Results (from the past 240 hour(s))  Urine culture     Status: None   Collection Time: 12/27/14  2:30 PM  Result Value Ref Range Status   Specimen Description URINE, RANDOM  Final   Special Requests NONE   Final   Colony Count   Final    30,000 COLONIES/ML Performed at [facility]    Culture   Final    Multiple bacterial morphotypes present, none predominant. Suggest appropriate recollection if clinically indicated. Performed at [facility]    Report Status 12/28/2014 FINAL  Final  Clostridium Difficile by PCR     Status: None   Collection Time: 12/29/14  6:42 PM  Result Value Ref Range Status   C difficile by pcr NEGATIVE NEGATIVE Final     Labs: Basic Metabolic Panel:  Recent Labs Lab 12/24/14 1220 12/27/14 1003 12/27/14 1556 12/27/14 1910 12/28/14 0333 12/29/14 0535 12/30/14 0721  NA 140 140  --   --  142 142 140  K 3.7 3.8  --   --  3.2* 3.3* 4.1  CL 104 105  --   --  109 110 109  CO2 28 26  --   --  28 23 24   GLUCOSE  88 135*  --   --  135* 138* 123*  BUN 24* 11  --   --  8 19 25*  CREATININE 0.87 0.76 0.72  --  0.67 0.77 0.85  CALCIUM 9.3 9.5  --   --  9.0 9.3 9.3  MG  --   --   --  2.3  --   --   --    Liver Function Tests:  Recent Labs Lab 12/27/14 1003 12/28/14 0333  AST 25 24  ALT 25 23  ALKPHOS 79 72  BILITOT 0.8 1.0  PROT 6.5 6.1  ALBUMIN 3.6 3.2*   No results for input(s): LIPASE, AMYLASE in the last 168 hours.  Recent Labs Lab 12/27/14 1910  AMMONIA 16   CBC:  Recent Labs Lab 12/23/14 1949 12/27/14 1003 12/27/14 1556 12/28/14 0333  WBC 8.0 7.1 7.4 6.9  NEUTROABS  --  5.8  --   --   HGB 13.4 13.5 14.9 13.5  HCT 40.7 39.8 43.9  45.3 40.3  MCV 94.4 93.6 93.8 94.2  PLT 214 217 267 222   Cardiac Enzymes:  Recent Labs Lab 12/27/14 1003 12/27/14 1556 12/27/14 1910 12/28/14 0333  TROPONINI <0.03 0.04* 0.10* 0.06*   BNP: BNP (last 3 results) No results for input(s): BNP in the last 8760 hours.  ProBNP (last 3 results) No results for input(s): PROBNP in the last 8760 hours.  CBG:  Recent Labs Lab 12/29/14 1151 12/29/14 1657 12/29/14 2123 12/30/14 0815 12/30/14 1235  GLUCAP 129* 117* 131* 111* 112*        Signed:  Kadra Kohan A  [facility] 12/30/2014, 2:26 PM

## 2014-12-30 NOTE — Progress Notes (Signed)
Patient Name: [name] Date of Encounter: 12/30/2014   Principal Problem:   Acute encephalopathy Active Problems:   Atrial flutter, unspecified   Type 2 diabetes mellitus without complication   Physical deconditioning   GERD (gastroesophageal reflux disease)   Hyperlipidemia   Glaucoma   Word finding difficulty   Essential hypertension   SUBJECTIVE  Converted to sinus rhythm last night.  Feels well this am.  No chest pain, sob.  CURRENT MEDS . aspirin EC  81 mg Oral Daily  . cholecalciferol  2,000 Units Oral Daily  . clopidogrel  75 mg Oral Daily  . diltiazem  90 mg Oral 3 times per day  . dorzolamide-timolol  1 drop Both Eyes BID  . enoxaparin (LOVENOX) injection  40 mg Subcutaneous Q24H  . lactobacillus acidophilus  1 tablet Oral Daily  . metoprolol  100 mg Oral BID  . pantoprazole  40 mg Oral Daily  . sodium chloride  3 mL Intravenous Q12H  . sodium chloride  3 mL Intravenous Q12H    OBJECTIVE  Filed Vitals:   12/29/14 2000 12/29/14 2126 12/30/14 0233 12/30/14 0636  BP:  145/88 146/65 152/72  Pulse:  101 72 72  Temp:  97.8 F (36.6 C) 98.5 F (36.9 C) 98.9 F (37.2 C)  TempSrc:  Oral Axillary Oral  Resp: 19 20 22 22   Height:      Weight:      SpO2:  100% 97% 99%    Intake/Output Summary (Last 24 hours) at 12/30/14 0921 Last data filed at 12/29/14 1714  Gross per 24 hour  Intake    980 ml  Output    600 ml  Net    380 ml   Filed Weights   12/28/14 1445  Weight: 159 lb 1.6 oz (72.167 kg)    PHYSICAL EXAM  General: Pleasant, NAD. Neuro: Alert and oriented X 3. Moves all extremities spontaneously. Psych: Normal affect. HEENT:  Normal  Neck: Supple without bruits or JVD. Lungs:  Resp regular and unlabored, CTA. Heart: RRR no s3, s4, or murmurs. Abdomen: Soft, non-tender, non-distended, BS + x 4.  Extremities: No clubbing, cyanosis or edema. DP/PT/Radials 2+ and equal bilaterally.  Accessory Clinical Findings  CBC  Recent Labs  12/27/14 1003 12/27/14 1556 12/28/14 0333  WBC 7.1 7.4 6.9  NEUTROABS 5.8  --   --   HGB 13.5 14.9 13.5  HCT 39.8 43.9  45.3 40.3  MCV 93.6 93.8 94.2  PLT 217 267 419   Basic Metabolic Panel  Recent Labs  12/27/14 1910  12/29/14 0535 12/30/14 0721  NA  --   < > 142 140  K  --   < > 3.3* 4.1  CL  --   < > 110 109  CO2  --   < > 23 24  GLUCOSE  --   < > 138* 123*  BUN  --   < > 19 25*  CREATININE  --   < > 0.77 0.85  CALCIUM  --   < > 9.3 9.3  MG 2.3  --   --   --   < > = values in this interval not displayed. Liver Function Tests  Recent Labs  12/27/14 1003 12/28/14 0333  AST 25 24  ALT 25 23  ALKPHOS 79 72  BILITOT 0.8 1.0  PROT 6.5 6.1  ALBUMIN 3.6 3.2*   Cardiac Enzymes  Recent Labs  12/27/14 1556 12/27/14 1910 12/28/14 0333  TROPONINI 0.04* 0.10*  0.06*   Hemoglobin A1C  Recent Labs  12/28/14 1230  HGBA1C 5.8*   Fasting Lipid Panel  Recent Labs  12/28/14 0333  CHOL 163  HDL 38*  LDLCALC 102*  TRIG 113  CHOLHDL 4.3   Thyroid Function Tests  Recent Labs  12/27/14 1556  TSH 1.206    TELE  Converted to sinus rhythm @ 22:43 last night.  ASSESSMENT AND PLAN  1.  New onset atrial flutter/fib:  Recurrent af/fl yesterday.  dilt titrated to 90 q 8.  Also on toprol xl 100 bid.  Converted to sinus last night.  CHA2DS2VASc = 7 though poor candidate for [place] 2/2 h/o falls. Currently on asa/plavix.  Consolidate dilt to 300mg  daily today (bp elevated and should be able to tolerate additional dose).  Lytes stable this AM.  2.  CAD:  S/p remote stenting.  Mild troponin elevation in setting of probable demand ischemia during aflutter.  Cont asa, plavix, bb.  Nl EF by echo this admission (EF 55-60%, no RWMAs and Gr 1 DD).  LDL 102. Consider adding statin though with advanced age, benefit not clear.  3.   AMS:  Stable.  Pleasantly demented.  4.  Hypokalemia:  Resolved.  5.  HTN:  BP up over past 24 hrs.  Consolidating dilt to 300 mg daily.  Cont  bb.  Signed, Murray Hodgkins NP   Patient seen and examined. Agree with assessment and plan. Converted to sinus rhythm with increased cardizem. Tolerating the addition of plavix to ASA. No chest pain, sob. No bleeding. K repleted.   Troy Sine, MD, [facility] 12/30/2014 2:11 PM

## 2015-01-27 ENCOUNTER — Other Ambulatory Visit: Payer: Self-pay | Admitting: Internal Medicine

## 2015-01-27 DIAGNOSIS — J209 Acute bronchitis, unspecified: Secondary | ICD-10-CM

## 2015-01-27 MED ORDER — AZITHROMYCIN 250 MG PO TABS: ORAL_TABLET | ORAL | Status: DC

## 2015-01-31 DIAGNOSIS — I10 Essential (primary) hypertension: Secondary | ICD-10-CM | POA: Diagnosis not present

## 2015-01-31 DIAGNOSIS — F0390 Unspecified dementia without behavioral disturbance: Secondary | ICD-10-CM | POA: Diagnosis not present

## 2015-01-31 DIAGNOSIS — I4891 Unspecified atrial fibrillation: Secondary | ICD-10-CM | POA: Diagnosis not present

## 2015-01-31 DIAGNOSIS — I2510 Atherosclerotic heart disease of native coronary artery without angina pectoris: Secondary | ICD-10-CM | POA: Diagnosis not present

## 2015-02-01 ENCOUNTER — Ambulatory Visit (HOSPITAL_COMMUNITY)
Admission: RE | Admit: 2015-02-01 | Discharge: 2015-02-01 | Disposition: A | Discharge status: Home / Self Care | Payer: Medicare Other | Source: Ambulatory Visit | Attending: Internal Medicine | Admitting: Internal Medicine

## 2015-02-01 ENCOUNTER — Other Ambulatory Visit: Payer: Self-pay | Admitting: *Deleted

## 2015-02-01 ENCOUNTER — Encounter: Payer: Self-pay | Admitting: Internal Medicine

## 2015-02-01 ENCOUNTER — Ambulatory Visit (INDEPENDENT_AMBULATORY_CARE_PROVIDER_SITE_OTHER): Payer: Medicare Other | Admitting: Internal Medicine

## 2015-02-01 VITALS — BP 106/60 | HR 62 | Temp 98.0°F | Resp 16 | Ht 62.5 in | Wt 157.0 lb

## 2015-02-01 DIAGNOSIS — J069 Acute upper respiratory infection, unspecified: Secondary | ICD-10-CM | POA: Insufficient documentation

## 2015-02-01 DIAGNOSIS — R05 Cough: Secondary | ICD-10-CM | POA: Diagnosis not present

## 2015-02-01 DIAGNOSIS — Z9012 Acquired absence of left breast and nipple: Secondary | ICD-10-CM | POA: Insufficient documentation

## 2015-02-01 DIAGNOSIS — Z09 Encounter for follow-up examination after completed treatment for conditions other than malignant neoplasm: Secondary | ICD-10-CM

## 2015-02-01 DIAGNOSIS — I517 Cardiomegaly: Secondary | ICD-10-CM | POA: Insufficient documentation

## 2015-02-01 MED ORDER — LEVOFLOXACIN 750 MG PO TABS: 750.0000 mg | ORAL_TABLET | Freq: Every day | ORAL | Status: DC

## 2015-02-01 NOTE — Patient Instructions (Signed)
HOW TO TREAT  COUGH AND COLD SYMPTOMS:  -Symptoms usually last at least 1 week with the worst symptoms being around day 4.  - colds usually start with a sore throat and end with a cough, and the cough can take 2 weeks to get better.  -No antibiotics are needed for colds, flu, sore throats, cough, bronchitis UNLESS symptoms are longer than 7 days OR if you are getting better then get drastically worse.  -There are a lot of combination medications (Dayquil, Nyquil, Vicks 44, tyelnol cold and sinus, ETC). Please look at the ingredients on the back so that you are treating the correct symptoms and not doubling up on medications/ingredients.    Medicines you can use  Nasal congestion  - pseudoephedrine (Sudafed)- behind the counter, do not use if you have high blood pressure, medicine that have -D in them.  - phenylephrine (Sudafed PE) -Dextormethorphan + chlorpheniramine (Coridcidin HBP)- okay if you have high blood pressure -Oxymetazoline (Afrin) nasal spray- LIMIT to 3 days -Saline nasal spray -Neti pot (used distilled or bottled water)  Ear pain/congestion  -pseudoephedrine (sudafed) - Nasonex/flonase nasal spray  Fever  -Acetaminophen (Tyelnol) -Ibuprofen (Advil, motrin, aleve)  Sore Throat  -Acetaminophen (Tyelnol) -Ibuprofen (Advil, motrin, aleve) -Drink a lot of water -Gargle with salt water - Rest your voice (don't talk) -Throat sprays -Cough drops  Body Aches  -Acetaminophen (Tyelnol) -Ibuprofen (Advil, motrin, aleve)  Headache  -Acetaminophen (Tyelnol) -Ibuprofen (Advil, motrin, aleve) - Exedrin, Exedrin Migraine  Allergy symptoms (cough, sneeze, runny nose, itchy eyes) -Claritin or loratadine cheapest but likely the weakest  -Zyrtec or certizine at night because it can make you sleepy -The strongest is allegra or fexafinadine  Cheapest at [employer], [employer], [employer]  Cough  -Dextromethorphan (Delsym)- medicine that has DM in it -Guafenesin (Mucinex/Robitussin) -  cough drops - drink lots of water  Chest Congestion  -Guafenesin (Mucinex/Robitussin)  Red Itchy Eyes  - Naphcon-A  Upset Stomach  - Bland diet (nothing spicy, greasy, fried, and high acid foods like tomatoes, oranges, berries) -OKAY- cereal, bread, soup, crackers, rice -Eat smaller more frequent meals -reduce caffeine, no alcohol -Loperamide (Imodium-AD) if diarrhea -Prevacid for heart burn  General health when sick  -Hydration -wash your hands frequently -keep surfaces clean -change pillow cases and sheets often -Get fresh air but do not exercise strenuously -Vitamin D, double up on it - Vitamin C -Zinc

## 2015-02-01 NOTE — Progress Notes (Addendum)
Subjective:    Patient ID: [name], female    DOB: 07/30/1922, 81 y.o.   MRN: [id number]  HPI  Patient is a 81 y.o. Female who presents to the office with her home health aid for evaluation of cold.  Patient developed a cough that started approximately 6 days ago.  She has been having coughing with some phlegm production.  Phlegm is clear. Stuffy, runny nose, with clear nasal mucous.  No ear pain.  Hearing muffled.  No shortness of breath, no fevers, chills, N/V.  Patient has completed four days of azithromycin.  She has improved but has not gotten all the way better.  Was doing okay with delsym but is now out of it.  No saline use.  No other OTC use.    Patient also presents for f/u of hospital visit on 12/27/14 for encephalopathy of unknown origin.  Patient was admitted for 5 days and during that time had a negative workup for TIA and stroke.  MRI and head CT normal.  No evidence of infection.  Blood pressure medications were changed and diltiazem was stopped and her beta blocker medication was changed to twice daily instead of once daily.  Patient did have a positive RPR while hospitalized.  Patient was supposed to have a confirmatory test done while she was rehabilitating at [facility] center.  We have not received any information of repeat exam at this time.    Review of Systems  Constitutional: Positive for fatigue. Negative for fever and chills.  HENT: Positive for congestion, postnasal drip, rhinorrhea and sinus pressure. Negative for ear pain, nosebleeds, sneezing, sore throat, trouble swallowing and voice change.        Muffled hearing  Respiratory: Positive for cough and shortness of breath. Negative for chest tightness and wheezing.   Cardiovascular: Negative for chest pain, palpitations and leg swelling.  Gastrointestinal: Negative for nausea, vomiting, diarrhea and constipation.  Psychiatric/Behavioral: Negative for confusion and sleep disturbance.        Objective:   Physical Exam  Constitutional: She is oriented to person, place, and time. She appears well-developed and well-nourished. No distress.  HENT:  Head: Normocephalic and atraumatic.  Nose: Mucosal edema present. Right sinus exhibits maxillary sinus tenderness. Right sinus exhibits no frontal sinus tenderness. Left sinus exhibits maxillary sinus tenderness. Left sinus exhibits no frontal sinus tenderness.  Mouth/Throat: Oropharynx is clear and moist. No oropharyngeal exudate.  Minimal sinus tenderness to palpation  Eyes: Conjunctivae and EOM are normal. Pupils are equal, round, and reactive to light. No scleral icterus.  Neck: Normal range of motion. Neck supple. No JVD present. No thyromegaly present.  Cardiovascular: Normal rate, regular rhythm, normal heart sounds and intact distal pulses.  Exam reveals no gallop and no friction rub.   No murmur heard. Pulmonary/Chest: Effort normal and breath sounds normal. No respiratory distress. She has no wheezes. She has no rales. She exhibits no tenderness.  Abdominal: Soft. Bowel sounds are normal. She exhibits no distension and no mass. There is no tenderness. There is no rebound and no guarding.  Musculoskeletal: Normal range of motion.  Lymphadenopathy:    She has no cervical adenopathy.  Neurological: She is alert and oriented to person, place, and time.  Skin: Skin is warm and dry. She is not diaphoretic.  Psychiatric: She has a normal mood and affect. Her behavior is normal. Judgment and thought content normal.  Nursing note and vitals reviewed.  Filed Vitals:   02/01/15 1140  BP: 106/60  Pulse: 62  Temp: 98 F (36.7 C)  Resp: 16          Assessment & Plan:    1. Acute upper respiratory infection -symptomatic care with saline, tylenol, and drinking plenty of fluids. - Delsym recommended - DG Chest 2 View; Future - levofloxacin (LEVAQUIN) 750 MG tablet; Take 1 tablet (750 mg total) by mouth daily. X 7 days   Dispense: 7 tablet; Refill: 0  2. Hospital discharge follow-up -Patient followed with Home health nursing -BP is good here.  Cont medications -Have called nursing home to determine whether repeat testing was performed for confirmatory RPR positive test.  If they did not we will bring patient in for blood draw and will treat as indicated.     Chest xray with possible right hilar adenopathy.  CXR reviewed by me with no acute obvious changes.  Question of projection vs. Acute illness.  As patient is 81 y.o. Will avoid CT scan at this time.  Repeat CXR in 6 weeks to reevaluate.

## 2015-02-10 ENCOUNTER — Telehealth: Payer: Self-pay | Admitting: *Deleted

## 2015-02-10 NOTE — Telephone Encounter (Signed)
Granddaughter called and asked if patient should be taking Meloxicam.  OK to take if needed for pain per Dr Melford Aase.  Granddaughter aware and states that patient has the med at home.

## 2015-03-10 ENCOUNTER — Encounter: Payer: Self-pay | Admitting: Internal Medicine

## 2015-03-10 ENCOUNTER — Ambulatory Visit (INDEPENDENT_AMBULATORY_CARE_PROVIDER_SITE_OTHER): Payer: Medicare Other | Admitting: Internal Medicine

## 2015-03-10 ENCOUNTER — Ambulatory Visit: Payer: Self-pay | Admitting: Physician Assistant

## 2015-03-10 VITALS — BP 128/80 | HR 64 | Temp 97.8°F | Resp 18 | Ht 62.5 in | Wt 152.0 lb

## 2015-03-10 DIAGNOSIS — E119 Type 2 diabetes mellitus without complications: Secondary | ICD-10-CM

## 2015-03-10 DIAGNOSIS — Z79899 Other long term (current) drug therapy: Secondary | ICD-10-CM

## 2015-03-10 DIAGNOSIS — I10 Essential (primary) hypertension: Secondary | ICD-10-CM

## 2015-03-10 DIAGNOSIS — E785 Hyperlipidemia, unspecified: Secondary | ICD-10-CM

## 2015-03-10 DIAGNOSIS — R5381 Other malaise: Secondary | ICD-10-CM

## 2015-03-10 DIAGNOSIS — E559 Vitamin D deficiency, unspecified: Secondary | ICD-10-CM

## 2015-03-10 LAB — CBC WITH DIFFERENTIAL/PLATELET
Basophils Absolute: 0 10*3/uL (ref 0.0–0.1)
Basophils Relative: 0 % (ref 0–1)
EOS ABS: 0.1 10*3/uL (ref 0.0–0.7)
Eosinophils Relative: 2 % (ref 0–5)
HCT: 40.6 % (ref 36.0–46.0)
HEMOGLOBIN: 13.2 g/dL (ref 12.0–15.0)
LYMPHS ABS: 1.9 10*3/uL (ref 0.7–4.0)
LYMPHS PCT: 29 % (ref 12–46)
MCH: 30.6 pg (ref 26.0–34.0)
MCHC: 32.5 g/dL (ref 30.0–36.0)
MCV: 94.2 fL (ref 78.0–100.0)
MPV: 9.5 fL (ref 8.6–12.4)
Monocytes Absolute: 0.6 10*3/uL (ref 0.1–1.0)
Monocytes Relative: 9 % (ref 3–12)
NEUTROS ABS: 4 10*3/uL (ref 1.7–7.7)
Neutrophils Relative %: 60 % (ref 43–77)
Platelets: 304 10*3/uL (ref 150–400)
RBC: 4.31 MIL/uL (ref 3.87–5.11)
RDW: 13.4 % (ref 11.5–15.5)
WBC: 6.7 10*3/uL (ref 4.0–10.5)

## 2015-03-10 LAB — HEMOGLOBIN A1C
Hgb A1c MFr Bld: 5.9 % — ABNORMAL HIGH (ref <5.7)
Mean Plasma Glucose: 123 mg/dL — ABNORMAL HIGH (ref <117)

## 2015-03-10 NOTE — Progress Notes (Signed)
Patient ID: [name], female   DOB: [birth year], 81 y.o.   MRN: [id number]  Assessment and Plan:  Hypertension:  -Continue medication -monitor blood pressure at home. -Continue DASH diet -Reminder to go to the ER if any CP, SOB, nausea, dizziness, severe HA, changes vision/speech, left arm numbness and tingling and jaw pain.  Cholesterol - Continue diet and exercise -Check cholesterol.   Diabetes without complications -Continue diet and exercise.  -Check A1C  Vitamin D Def -check level -continue medications.   Deconditioning -Home health PT eval  Continue diet and meds as discussed. Further disposition pending results of labs. Discussed med's effects and SE's.    HPI 81 y.o. female  presents for 3 month follow up with hypertension, hyperlipidemia, diabetes and vitamin D deficiency.   Her blood pressure has been controlled at home, today their BP is BP: 128/80 mmHg.She does not workout. She denies chest pain, shortness of breath, dizziness.   She is not on cholesterol medication and denies myalgias. Her cholesterol is at goal. The cholesterol was:  12/28/2014: Cholesterol, Total 163; HDL-C 38*; LDL (calc) 102*; Triglycerides 113   She has been working on diet and exercise for diabetes without complications, she is on bASA, she is on ACE/ARB, and denies  foot ulcerations, hyperglycemia, hypoglycemia , increased appetite, nausea, paresthesia of the feet, polydipsia, polyuria, visual disturbances, vomiting and weight loss. Last A1C was: 12/28/2014: Hemoglobin-A1c 5.8*   Patient is on Vitamin D supplement. 12/01/2014: VITD 85  She is doing well.  She is no longer doing any therapy.  She does have a home health aid.  She does not do a whole lot of walking around without her walker.    Current Medications:  Current Outpatient Prescriptions on File Prior to Visit  Medication Sig Dispense Refill  . ALPHA LIPOIC ACID PO Take 200 mg by mouth daily.      Marland Kitchen aspirin EC 81 MG tablet Take  81 mg by mouth daily.      . B Complex-C (B-COMPLEX WITH VITAMIN C) tablet Take 1 tablet by mouth daily.      . bumetanide (BUMEX) 2 MG tablet Take 1 tablet by mouth  daily for blood pressure  and fluid 90 tablet 1  . CHOLECALCIFEROL PO Take 9,000 Units by mouth daily.    Marland Kitchen diltiazem (CARDIZEM CD) 300 MG 24 hr capsule Take 1 capsule (300 mg total) by mouth daily.    . dorzolamide-timolol (COSOPT) 22.3-6.8 MG/ML ophthalmic solution Place 1 drop into both eyes 2 (two) times daily.  0  . Garlic 9518 MG CAPS Take 1,000 mg by mouth daily.    . Iodine, Kelp, (KELP PO) Take 1 tablet by mouth daily.      . isosorbide mononitrate (IMDUR) 30 MG 24 hr tablet Take 1 tablet by mouth  every night at bedtime 90 tablet 1  . latanoprost (XALATAN) 0.005 % ophthalmic solution Place 1 drop into both eyes at bedtime.      Marland Kitchen losartan (COZAAR) 100 MG tablet Take 50 mg by mouth 2 (two) times daily.    . metoprolol (TOPROL-XL) 100 MG 24 hr tablet Take 1 tablet (100 mg total) by mouth 2 (two) times daily.    Marland Kitchen OVER THE COUNTER MEDICATION Take 1 capsule by mouth every evening. Herbavision. Hold while in hospital     . saccharomyces boulardii (FLORASTOR) 250 MG capsule Take 1 capsule (250 mg total) by mouth 2 (two) times daily.    . traMADol (ULTRAM) 50 MG  tablet Take 1 tablet (50 mg total) by mouth 4 (four) times daily. 10 tablet 0  . Vitamins-Lipotropics (B-50 COMPLEX) TABS Take 1 tablet by mouth daily.    . Zinc 50 MG TABS Take 75 mg by mouth daily.     No current facility-administered medications on file prior to visit.   Medical History:  Past Medical History  Diagnosis Date  . Meningitis   . Macular degeneration   . Hypertension   . GERD (gastroesophageal reflux disease)   . Hyperlipidemia   . Elevated hemoglobin A1c   . Cataract   . Cancer 1979 mastectomy  . Glaucoma     Right pupil (about 61mm) is larger than left pupil (about 15mm) due to surgery in 1973.  Amblyopia in right eye.   Allergies:   Allergies  Allergen Reactions  . Lactose Intolerance (Gi) Other (See Comments)    unknown     Review of Systems:  Review of Systems  Constitutional: Negative for fever, chills and malaise/fatigue.  HENT: Negative for congestion, ear pain and sore throat.   Respiratory: Negative for cough, shortness of breath and wheezing.   Cardiovascular: Negative for chest pain, palpitations and leg swelling.  Gastrointestinal: Negative for heartburn, nausea, vomiting, abdominal pain, diarrhea, constipation, blood in stool and melena.  Genitourinary: Negative.  Negative for dysuria, urgency and frequency.  Skin: Negative.   Neurological: Negative for dizziness, sensory change and headaches.    Family history- Review and unchanged  Social history- Review and unchanged  Physical Exam: BP 128/80 mmHg  Pulse 64  Temp(Src) 97.8 F (36.6 C) (Temporal)  Resp 18  Ht 5' 2.5" (1.588 m)  Wt 152 lb (68.947 kg)  BMI 27.34 kg/m2 Wt Readings from Last 3 Encounters:  03/10/15 152 lb (68.947 kg)  02/01/15 157 lb (71.215 kg)  12/28/14 159 lb 1.6 oz (72.167 kg)   General Appearance: Well nourished well developed, non-toxic appearing, in no apparent distress. Eyes: Right eye with blown pupil (Chronic) PERRLA, EOMs, conjunctiva no swelling or erythema ENT/Mouth: Ear canals clear with no erythema, swelling, or discharge.  Left ear with cerumen impaction. TMs normal bilaterally, oropharynx clear, moist, with no exudate.   Neck: Supple, thyroid normal, no JVD, no cervical adenopathy.  Respiratory: Respiratory effort normal, breath sounds clear A&P, no wheeze, rhonchi or rales noted.  No retractions, no accessory muscle usage Cardio: RRR with no RGs. Soft murmur,  No noted edema. Mild 1+ edema of the legs.   Abdomen: Soft, + BS.  Non tender, no guarding, rebound, masses.  Left sided incisional hernia. Musculoskeletal: Full ROM, 4/5 strength, Ambulates with walker gait Skin: Warm, dry without rashes, lesions,  ecchymosis.  Neuro: Awake and oriented X 3, Cranial nerves intact. No cerebellar symptoms.  Psych: normal affect, Insight and Judgment appropriate.    FORCUCCI, Nelva Hauk, PA-C 11:38 AM [facility]

## 2015-03-10 NOTE — Patient Instructions (Signed)

## 2015-03-11 LAB — BASIC METABOLIC PANEL WITH GFR
BUN: 16 mg/dL (ref 6–23)
CHLORIDE: 103 meq/L (ref 96–112)
CO2: 27 meq/L (ref 19–32)
Calcium: 9.1 mg/dL (ref 8.4–10.5)
Creat: 0.73 mg/dL (ref 0.50–1.10)
GFR, Est African American: 84 mL/min
GFR, Est Non African American: 73 mL/min
Glucose, Bld: 105 mg/dL — ABNORMAL HIGH (ref 70–99)
POTASSIUM: 3.7 meq/L (ref 3.5–5.3)
Sodium: 139 mEq/L (ref 135–145)

## 2015-03-11 LAB — LIPID PANEL
CHOLESTEROL: 143 mg/dL (ref 0–200)
HDL: 35 mg/dL — AB (ref >=46)
LDL CALC: 78 mg/dL (ref 0–99)
TRIGLYCERIDES: 149 mg/dL (ref <150)
Total CHOL/HDL Ratio: 4.1 Ratio
VLDL: 30 mg/dL (ref 0–40)

## 2015-03-11 LAB — HEPATIC FUNCTION PANEL
ALBUMIN: 3.5 g/dL (ref 3.5–5.2)
ALK PHOS: 75 U/L (ref 39–117)
ALT: 15 U/L (ref 0–35)
AST: 18 U/L (ref 0–37)
BILIRUBIN INDIRECT: 0.5 mg/dL (ref 0.2–1.2)
Bilirubin, Direct: 0.1 mg/dL (ref 0.0–0.3)
TOTAL PROTEIN: 5.9 g/dL — AB (ref 6.0–8.3)
Total Bilirubin: 0.6 mg/dL (ref 0.2–1.2)

## 2015-03-11 LAB — VITAMIN D 25 HYDROXY (VIT D DEFICIENCY, FRACTURES): Vit D, 25-Hydroxy: 82 ng/mL (ref 30–100)

## 2015-03-11 LAB — TSH: TSH: 1.484 u[IU]/mL (ref 0.350–4.500)

## 2015-03-11 LAB — MAGNESIUM: Magnesium: 2 mg/dL (ref 1.5–2.5)

## 2015-03-11 LAB — INSULIN, FASTING: Insulin fasting, serum: 3.7 u[IU]/mL (ref 2.0–19.6)

## 2015-03-12 DIAGNOSIS — M6281 Muscle weakness (generalized): Secondary | ICD-10-CM

## 2015-03-12 DIAGNOSIS — R269 Unspecified abnormalities of gait and mobility: Secondary | ICD-10-CM

## 2015-03-12 DIAGNOSIS — I10 Essential (primary) hypertension: Secondary | ICD-10-CM

## 2015-03-12 DIAGNOSIS — H3530 Unspecified macular degeneration: Secondary | ICD-10-CM

## 2015-03-26 ENCOUNTER — Other Ambulatory Visit: Payer: Self-pay | Admitting: Physician Assistant

## 2015-03-29 ENCOUNTER — Encounter: Payer: Self-pay | Admitting: Internal Medicine

## 2015-03-29 ENCOUNTER — Ambulatory Visit (INDEPENDENT_AMBULATORY_CARE_PROVIDER_SITE_OTHER): Payer: Medicare Other | Admitting: Internal Medicine

## 2015-03-29 VITALS — BP 122/80 | HR 72 | Temp 97.5°F | Resp 16 | Ht 63.0 in | Wt 147.8 lb

## 2015-03-29 DIAGNOSIS — M79652 Pain in left thigh: Secondary | ICD-10-CM

## 2015-03-29 DIAGNOSIS — R29898 Other symptoms and signs involving the musculoskeletal system: Secondary | ICD-10-CM | POA: Diagnosis not present

## 2015-03-29 MED ORDER — PREDNISONE 10 MG PO TABS: 10.0000 mg | ORAL_TABLET | Freq: Every day | ORAL | Status: DC

## 2015-03-29 NOTE — Patient Instructions (Signed)
Take prednisone in the morning with breakfast.  Take tramadol in the morning with breakfast and also take as needed for pain in the evening.    We will see you back in 2 weeks  We will call when we have an appointment set up with Dr. Erlinda Hong.

## 2015-03-29 NOTE — Progress Notes (Signed)
Subjective:    Patient ID: [name], female    DOB: Jun 19, 1922, 81 y.o.   MRN: [id number]  HPI  Patient is a 81 y.o. Female who presents to the office for evaluation of left leg pain and weakness.  She is here with her granddaughter and her sitter.  She has been working on physical therapy and has been doing a lot of different exercises which she has been doing at home even when she is not in her therapy.  Her sitter reports that she stops a lot of the time for left thigh discomfort.  She has had some near falls with her home health sitter.  She has not had any more falls.  She does use a cane occasionally, but is also using a walker.  [name] feels her pain is intermittent and it is more bothersome when the weather is bad.  Her pain is worse when she is doing her exercises.  She sometimes has pain all the way into her calf.  Her aid has not noticed any redness or swelling in the leg.  She is only taking tramadol as needed for pain which may be maximum once daily.    Review of Systems  Constitutional: Positive for fatigue. Negative for fever and chills.  Cardiovascular: Negative for leg swelling.  Musculoskeletal: Positive for myalgias and gait problem.  Neurological: Positive for weakness. Negative for dizziness, light-headedness and numbness.       Objective:   Physical Exam  Constitutional: She is oriented to person, place, and time. She appears well-developed and well-nourished. No distress.  HENT:  Head: Normocephalic and atraumatic.  Mouth/Throat: Oropharynx is clear and moist. No oropharyngeal exudate.  Eyes: Conjunctivae are normal. No scleral icterus.  Neck: Normal range of motion. Neck supple. No JVD present. No thyromegaly present.  Cardiovascular: Normal rate, regular rhythm and intact distal pulses.  Exam reveals no gallop and no friction rub.   Murmur heard. No femoral vein tenderness, no calf tenderness bilaterally.  No palpable cords.  Pulmonary/Chest: Effort normal  and breath sounds normal. No respiratory distress. She has no wheezes. She has no rales. She exhibits no tenderness.  Musculoskeletal:       Left hip: She exhibits decreased strength and tenderness. She exhibits normal range of motion, no bony tenderness, no swelling, no crepitus, no deformity and no laceration.       Left knee: She exhibits normal range of motion, no swelling, no effusion, no ecchymosis, no deformity, no laceration, normal alignment, no LCL laxity, normal patellar mobility and no MCL laxity. Tenderness found. Medial joint line and lateral joint line tenderness noted. No patellar tendon tenderness noted.       Left upper leg: She exhibits tenderness. She exhibits no bony tenderness, no swelling, no edema, no deformity and no laceration.  Irritation with internal and external rotation of the hip.  Lymphadenopathy:    She has no cervical adenopathy.  Neurological: She is alert and oriented to person, place, and time.  Skin: She is not diaphoretic.  Nursing note and vitals reviewed.   Filed Vitals:   03/29/15 1114  BP: 122/80  Pulse: 72  Temp: 97.5 F (36.4 C)  Resp: 16         Assessment & Plan:   Patient also seen by and examined by Dr. Melford Aase who agrees with the below plan.  1. Left thigh pain -Exam more consistent with arthritis and muscular deconditioning than with fracture.   -continue PT -ultram in morning  prior to therapy and as needed in the evening -recheck in 2 weeks - predniSONE (DELTASONE) 10 MG tablet; Take 1 tablet (10 mg total) by mouth daily with breakfast.  Dispense: 30 tablet; Refill: 0 - Ambulatory referral to Orthopedic Surgery  2. Left leg weakness  - predniSONE (DELTASONE) 10 MG tablet; Take 1 tablet (10 mg total) by mouth daily with breakfast.  Dispense: 30 tablet; Refill: 0 - Ambulatory referral to Orthopedic Surgery

## 2015-03-30 ENCOUNTER — Telehealth: Payer: Self-pay | Admitting: *Deleted

## 2015-03-30 NOTE — Telephone Encounter (Signed)
OK to hold PT until after patient sees the orthopedist.  Left message to inform [employer].

## 2015-04-06 ENCOUNTER — Other Ambulatory Visit: Payer: Self-pay | Admitting: Internal Medicine

## 2015-04-12 ENCOUNTER — Ambulatory Visit: Payer: Self-pay | Admitting: Internal Medicine

## 2015-04-16 ENCOUNTER — Ambulatory Visit (INDEPENDENT_AMBULATORY_CARE_PROVIDER_SITE_OTHER): Payer: Medicare Other | Admitting: Internal Medicine

## 2015-04-16 VITALS — BP 118/82 | HR 88 | Temp 97.0°F | Resp 16 | Ht 62.5 in | Wt 148.8 lb

## 2015-04-16 DIAGNOSIS — M1612 Unilateral primary osteoarthritis, left hip: Secondary | ICD-10-CM

## 2015-04-16 DIAGNOSIS — M7062 Trochanteric bursitis, left hip: Secondary | ICD-10-CM | POA: Diagnosis not present

## 2015-04-17 ENCOUNTER — Encounter: Payer: Self-pay | Admitting: Internal Medicine

## 2015-04-17 NOTE — Progress Notes (Signed)
   Subjective:    Patient ID: [name], female    DOB: 1921/12/01, 81 y.o.   MRN: [id number]  HPI 81 yo WWF represents for f/u with ongoing c/o left hip & thigh pains. She did see an Orthopedist and was offered a steroid injection which she deferred until f/u in this office. She continues to have pains in the left hip/thigh area and also continues to use a cane to support ambulation. She has been on low dose prednisone 10 mg daily for several weeks w/o significant improvement.   Meds - All - PMH,FH & SH - unchanged  Review of Systems 10 point systems review negative except as above.    Objective:   Physical Exam  BP 118/82 mmHg  Pulse 88  Temp(Src) 97 F (36.1 C)  Resp 16  Ht 5' 2.5" (1.588 m)  Wt 148 lb 12.8 oz (67.495 kg)  BMI 26.77 kg/m2  HEENT - Eac's patent. TM's Nl. EOM's full. PERRLA. NasoOroPharynx clear. Neck - supple. Nl Thyroid. Carotids 2+ & No bruits, nodes, JVD Chest - Clear equal BS w/o Rales, rhonchi, wheezes. Cor - Nl HS. RRR w/o sig MGR. PP 1(+). No edema. Abd - No palpable organomegaly, masses or tenderness. BS nl. MS- generalized decrease in Muscle, Power, Tone and Bulk. Sl tender over Left Gr Troch bursae and painful with int > ext rotation. Neg SLR. Sl limp with gait supported with a walking cane.  Neuro - No obvious Cr N abnormalities. Sensory, motor and Cerebellar functions appear Nl w/o focal abnormalities.    Assessment & Plan:   1. Trochanteric bursitis of left hip   2. Osteoarthritis of left hip, unspecified osteoarthritis type  - recc patient return to Orthopedist for Hip bursal injection. Over 15 minutes of exam, counseling, chart review and  decision making was performed

## 2015-04-29 ENCOUNTER — Telehealth: Payer: Self-pay | Admitting: *Deleted

## 2015-04-29 NOTE — Telephone Encounter (Signed)
Patient's grand-daughter called and states Tramadol causing the patient to be groggy.  Per Dr Melford Aase, try giving the patient 1/2 tablet.  Grand-daughter aware.

## 2015-05-17 ENCOUNTER — Telehealth: Payer: Self-pay | Admitting: *Deleted

## 2015-05-17 NOTE — Telephone Encounter (Signed)
Patient's grand-daughter called.  Patiient is taking Tramadol  1/2 tab 2 times a day and causing her to be groggy.  Patient no longer has sitters in her home.  Per Dr Melford Aase,  [place] to try Tylenol for pain and stop the Tramadol.  [facility] aware.

## 2015-05-28 ENCOUNTER — Other Ambulatory Visit: Payer: Self-pay | Admitting: Internal Medicine

## 2015-06-01 ENCOUNTER — Other Ambulatory Visit: Payer: Self-pay | Admitting: Internal Medicine

## 2015-06-01 DIAGNOSIS — I2510 Atherosclerotic heart disease of native coronary artery without angina pectoris: Secondary | ICD-10-CM

## 2015-06-01 MED ORDER — ISOSORBIDE MONONITRATE ER 30 MG PO TB24: ORAL_TABLET | ORAL | Status: AC

## 2015-06-04 ENCOUNTER — Other Ambulatory Visit: Payer: Self-pay | Admitting: Internal Medicine

## 2015-06-05 ENCOUNTER — Other Ambulatory Visit: Payer: Self-pay | Admitting: Internal Medicine

## 2015-06-07 ENCOUNTER — Other Ambulatory Visit: Payer: Self-pay | Admitting: *Deleted

## 2015-06-07 ENCOUNTER — Telehealth: Payer: Self-pay | Admitting: *Deleted

## 2015-06-07 MED ORDER — CIPROFLOXACIN HCL 500 MG PO TABS: 500.0000 mg | ORAL_TABLET | Freq: Two times a day (BID) | ORAL | Status: DC

## 2015-06-07 MED ORDER — CIPROFLOXACIN HCL 500 MG PO TABS: 500.0000 mg | ORAL_TABLET | Freq: Two times a day (BID) | ORAL | Status: AC

## 2015-06-07 MED ORDER — METRONIDAZOLE 250 MG PO TABS: 250.0000 mg | ORAL_TABLET | Freq: Three times a day (TID) | ORAL | Status: AC

## 2015-06-07 MED ORDER — METRONIDAZOLE 250 MG PO TABS: 250.0000 mg | ORAL_TABLET | Freq: Three times a day (TID) | ORAL | Status: DC

## 2015-06-07 NOTE — Telephone Encounter (Signed)
Grand daughter called and states patient continues to have diarrhea, even after increasing Immodium as Dr Melford Aase advised.  OK to send in RX or Cipro and Flagyl.

## 2015-06-10 ENCOUNTER — Encounter (HOSPITAL_COMMUNITY): Payer: Self-pay | Admitting: Neurology

## 2015-06-10 ENCOUNTER — Emergency Department (HOSPITAL_COMMUNITY)
Admission: EM | Admit: 2015-06-10 | Discharge: 2015-06-10 | Disposition: A | Discharge status: Home / Self Care | Payer: Medicare Other | Attending: Emergency Medicine | Admitting: Emergency Medicine

## 2015-06-10 ENCOUNTER — Other Ambulatory Visit: Payer: Self-pay | Admitting: Internal Medicine

## 2015-06-10 ENCOUNTER — Emergency Department (HOSPITAL_COMMUNITY)
Admission: EM | Admit: 2015-06-10 | Discharge: 2015-06-10 | Discharge status: Against Medical Advice | Payer: Medicare Other | Source: Home / Self Care | Attending: Emergency Medicine | Admitting: Emergency Medicine

## 2015-06-10 ENCOUNTER — Encounter (HOSPITAL_COMMUNITY): Payer: Self-pay

## 2015-06-10 ENCOUNTER — Emergency Department (HOSPITAL_COMMUNITY): Payer: Medicare Other

## 2015-06-10 DIAGNOSIS — Z8639 Personal history of other endocrine, nutritional and metabolic disease: Secondary | ICD-10-CM | POA: Diagnosis not present

## 2015-06-10 DIAGNOSIS — I10 Essential (primary) hypertension: Secondary | ICD-10-CM | POA: Insufficient documentation

## 2015-06-10 DIAGNOSIS — Z792 Long term (current) use of antibiotics: Secondary | ICD-10-CM | POA: Insufficient documentation

## 2015-06-10 DIAGNOSIS — Z9861 Coronary angioplasty status: Secondary | ICD-10-CM | POA: Insufficient documentation

## 2015-06-10 DIAGNOSIS — Z859 Personal history of malignant neoplasm, unspecified: Secondary | ICD-10-CM | POA: Diagnosis not present

## 2015-06-10 DIAGNOSIS — R630 Anorexia: Secondary | ICD-10-CM | POA: Insufficient documentation

## 2015-06-10 DIAGNOSIS — R197 Diarrhea, unspecified: Secondary | ICD-10-CM

## 2015-06-10 DIAGNOSIS — H409 Unspecified glaucoma: Secondary | ICD-10-CM | POA: Insufficient documentation

## 2015-06-10 DIAGNOSIS — Z79899 Other long term (current) drug therapy: Secondary | ICD-10-CM | POA: Diagnosis not present

## 2015-06-10 DIAGNOSIS — Z5321 Procedure and treatment not carried out due to patient leaving prior to being seen by health care provider: Secondary | ICD-10-CM

## 2015-06-10 DIAGNOSIS — I480 Paroxysmal atrial fibrillation: Secondary | ICD-10-CM | POA: Insufficient documentation

## 2015-06-10 DIAGNOSIS — Z7982 Long term (current) use of aspirin: Secondary | ICD-10-CM | POA: Diagnosis not present

## 2015-06-10 LAB — COMPREHENSIVE METABOLIC PANEL
ALK PHOS: 72 U/L (ref 38–126)
ALT: 38 U/L (ref 14–54)
ANION GAP: 6 (ref 5–15)
AST: 32 U/L (ref 15–41)
Albumin: 3.1 g/dL — ABNORMAL LOW (ref 3.5–5.0)
BUN: 17 mg/dL (ref 6–20)
CALCIUM: 9.2 mg/dL (ref 8.9–10.3)
CHLORIDE: 112 mmol/L — AB (ref 101–111)
CO2: 24 mmol/L (ref 22–32)
CREATININE: 1.09 mg/dL — AB (ref 0.44–1.00)
GFR calc Af Amer: 50 mL/min — ABNORMAL LOW (ref >60)
GFR, EST NON AFRICAN AMERICAN: 43 mL/min — AB (ref >60)
Glucose, Bld: 84 mg/dL (ref 65–99)
POTASSIUM: 4.3 mmol/L (ref 3.5–5.1)
Sodium: 142 mmol/L (ref 135–145)
Total Bilirubin: 0.8 mg/dL (ref 0.3–1.2)
Total Protein: 6.1 g/dL — ABNORMAL LOW (ref 6.5–8.1)

## 2015-06-10 LAB — CBC
HEMATOCRIT: 39.9 % (ref 36.0–46.0)
Hemoglobin: 13.2 g/dL (ref 12.0–15.0)
MCH: 31.2 pg (ref 26.0–34.0)
MCHC: 33.1 g/dL (ref 30.0–36.0)
MCV: 94.3 fL (ref 78.0–100.0)
Platelets: 241 10*3/uL (ref 150–400)
RBC: 4.23 MIL/uL (ref 3.87–5.11)
RDW: 15.5 % (ref 11.5–15.5)
WBC: 5.6 10*3/uL (ref 4.0–10.5)

## 2015-06-10 LAB — LIPASE, BLOOD: LIPASE: 46 U/L (ref 22–51)

## 2015-06-10 NOTE — ED Notes (Signed)
Called pt multiple times with no answer.

## 2015-06-10 NOTE — ED Notes (Signed)
Family at bedside. 

## 2015-06-10 NOTE — ED Notes (Signed)
Per Dr. Maryan Rued, she ordered feed the patient.  I provided her with a sandwich bag and a gingerale.

## 2015-06-10 NOTE — ED Provider Notes (Signed)
CSN: [id number]     Arrival date & time 06/10/15  1738 History   First MD Initiated Contact with Patient 06/10/15 1750     Chief Complaint  Patient presents with  . Hypotension     (Consider location/radiation/quality/duration/timing/severity/associated sxs/prior Treatment) Patient is a 81 y.o. female presenting with diarrhea. The history is provided by the patient and a relative.  Diarrhea Quality:  Watery Severity:  Moderate Onset quality:  Gradual Number of episodes:  6 months of intermittent diarrhea. Today she has had 2 episodes yesterday she had more than 4. The diarrhea seems to be getting worse in the last 1 week. Duration:  6 months Timing:  Intermittent Progression:  Worsening Relieved by:  Nothing Exacerbated by: food. Ineffective treatments: imodium, probiotic, diet changes. Associated symptoms: no abdominal pain, no chills, no recent cough, no diaphoresis, no fever, no myalgias and no vomiting   Associated symptoms comment:  No CP, SOb, near syncope. Risk factors: recent antibiotic use   Risk factors: no sick contacts   Risk factors comment:  Diarrhea started right after coming out of a nursing facility. She called her doctor this week and started on Cipro and Flagyl 5 days ago   Past Medical History  Diagnosis Date  . Meningitis   . Macular degeneration   . Hypertension   . GERD (gastroesophageal reflux disease)   . Hyperlipidemia   . Elevated hemoglobin A1c   . Cataract   . Cancer 1979 mastectomy  . Glaucoma     Right pupil (about 58mm) is larger than left pupil (about 101mm) due to surgery in 1973.  Amblyopia in right eye.   Past Surgical History  Procedure Laterality Date  . Abdominal hysterectomy    . Mastectomy    . Coronary stent placement    . Colon resection    . Eye surgery  1973    Dr. Katy Fitch  . Breast surgery Left 1979    mastectomy  . Skin cancer excision  2008     Squamous cell - left thigh   Family History  Problem Relation Age of Onset    . Hypertension Mother   . Hypertension Father   . CVA Father   . Arthritis Sister     Rheumatoid  . Heart attack Brother   . Heart disease Brother   . Diabetes Daughter   . Hypertension Daughter    History  Substance Use Topics  . Smoking status: Never Smoker   . Smokeless tobacco: Never Used  . Alcohol Use: No   OB History    No data available     Review of Systems  Constitutional: Negative for fever, chills and diaphoresis.  Gastrointestinal: Positive for diarrhea. Negative for vomiting and abdominal pain.  Musculoskeletal: Negative for myalgias.  All other systems reviewed and are negative.     Allergies  Lactose intolerance (gi)  Home Medications   Prior to Admission medications   Medication Sig Start Date End Date Taking? Authorizing Provider  ALPHA LIPOIC ACID PO Take 200 mg by mouth daily.     Yes Historical Provider, MD  aspirin EC 81 MG tablet Take 81 mg by mouth 2 (two) times daily.    Yes Historical Provider, MD  B Complex-C (B-COMPLEX WITH VITAMIN C) tablet Take 1 tablet by mouth daily.     Yes Historical Provider, MD  bumetanide (BUMEX) 2 MG tablet TAKE 1 TABLET BY MOUTH  DAILY FOR BLOOD PRESSURE  AND FLUID 05/29/15  Yes Unk Pinto, MD  CHOLECALCIFEROL PO Take 9,000 Units by mouth daily.   Yes Historical Provider, MD  ciprofloxacin (CIPRO) 500 MG tablet Take 1 tablet (500 mg total) by mouth 2 (two) times daily. Take with food. 06/07/15 06/13/15 Yes Unk Pinto, MD  diltiazem (CARDIZEM CD) 300 MG 24 hr capsule Take 1 capsule (300 mg total) by mouth daily. 12/30/14  Yes Verlee Monte, MD  dorzolamide-timolol (COSOPT) 22.3-6.8 MG/ML ophthalmic solution Place 1 drop into both eyes 2 (two) times daily. 12/03/14  Yes Historical Provider, MD  Garlic 0981 MG CAPS Take 1,000 mg by mouth daily.   Yes Historical Provider, MD  Iodine, Kelp, (KELP PO) Take 1 tablet by mouth daily.     Yes Historical Provider, MD  isosorbide mononitrate (IMDUR) 30 MG 24 hr tablet Take 1  tablet by mouth  every night at bedtime 06/01/15  Yes Unk Pinto, MD  latanoprost (XALATAN) 0.005 % ophthalmic solution Place 1 drop into both eyes at bedtime.     Yes Historical Provider, MD  loperamide (IMODIUM A-D) 2 MG tablet Take 2 mg by mouth 4 (four) times daily as needed for diarrhea or loose stools.   Yes Historical Provider, MD  losartan (COZAAR) 100 MG tablet Take one-half tablet by  mouth twice a day for blood pressure 05/29/15  Yes Unk Pinto, MD  meloxicam (MOBIC) 15 MG tablet Take 1 tablet by mouth  daily with food as needed  for pain Patient taking differently: Take 1 tablet by mouth  daily with food for pain 06/05/15  Yes Unk Pinto, MD  metoprolol (TOPROL-XL) 100 MG 24 hr tablet Take 1 tablet (100 mg total) by mouth 2 (two) times daily. 12/30/14  Yes Verlee Monte, MD  metroNIDAZOLE (FLAGYL) 250 MG tablet Take 1 tablet (250 mg total) by mouth 3 (three) times daily. Take with food. 06/07/15 06/14/15 Yes Unk Pinto, MD  OVER THE COUNTER MEDICATION Take 1 capsule by mouth every evening. Herbavision. Hold while in hospital    Yes Historical Provider, MD  saccharomyces boulardii (FLORASTOR) 250 MG capsule Take 1 capsule (250 mg total) by mouth 2 (two) times daily. 12/30/14  Yes Verlee Monte, MD  Vitamins-Lipotropics (B-50 COMPLEX) TABS Take 1 tablet by mouth daily.   Yes Historical Provider, MD  Zinc 50 MG TABS Take 75 mg by mouth daily.   Yes Historical Provider, MD  diltiazem (CARDIZEM) 120 MG tablet Take 1 tablet by mouth  twice a day 05/29/15   Unk Pinto, MD  metoprolol (TOPROL-XL) 200 MG 24 hr tablet Take 1 tablet by mouth  every day 05/29/15   Unk Pinto, MD  traMADol (ULTRAM) 50 MG tablet TAKE 1 TABLET BY MOUTH TWICE DAILY AS NEEDED FOR PAIN 03/26/15   Unk Pinto, MD   BP 110/76 mmHg  Pulse 109  Resp 23  SpO2 98% Physical Exam  Constitutional: She is oriented to person, place, and time. She appears well-developed and well-nourished. No distress.  HENT:    Head: Normocephalic and atraumatic.  Mouth/Throat: Oropharynx is clear and moist.  Eyes: Conjunctivae and EOM are normal. Pupils are equal, round, and reactive to light.  Neck: Normal range of motion. Neck supple.  Cardiovascular: Normal rate and intact distal pulses.  An irregularly irregular rhythm present.  No murmur heard. Pulmonary/Chest: Effort normal and breath sounds normal. No respiratory distress. She has no wheezes. She has no rales.  Abdominal: Soft. She exhibits no distension. There is no tenderness. There is no rebound and no guarding. A hernia is present. Hernia confirmed  positive in the ventral area.    No abdominal pain on palpation  Musculoskeletal: Normal range of motion. She exhibits no edema or tenderness.  Neurological: She is alert and oriented to person, place, and time.  Skin: Skin is warm and dry. No rash noted. No erythema.  Psychiatric: She has a normal mood and affect. Her behavior is normal.  Nursing note and vitals reviewed.   ED Course  Procedures (including critical care time) Labs Review Labs Reviewed  CLOSTRIDIUM DIFFICILE BY PCR (NOT AT [facility])  CBC WITH DIFFERENTIAL/PLATELET  GI PATHOGEN PANEL BY PCR, STOOL  I-STAT CHEM 8, ED    Imaging Review Dg Abd Acute W/chest  06/10/2015   CLINICAL DATA:  Diarrhea for 6 months, history of diverticulitis  EXAM: DG ABDOMEN ACUTE W/ 1V CHEST  COMPARISON:  02/01/2015, 12/30/2014  FINDINGS: Cardiac shadow is enlarged. No focal infiltrate or sizable effusion is noted.  No free air is seen. Scattered large and small bowel gas is noted. Central collection of air is noted similar to that seen on the prior exam from February of 2016. Postsurgical changes are again seen. No acute bony abnormality is noted. Diffuse aortic calcifications are noted.  IMPRESSION: Collection of air within the central abdomen. This was previously shown to represent bowel within anterior abdominal wall hernia. Correlation with the physical exam  may be helpful.   Electronically Signed   By: Inez Catalina M.D.   On: 06/10/2015 19:20     EKG Interpretation   Date/Time:  Thursday June 10 2015 17:52:09 EDT Ventricular Rate:  91 PR Interval:    QRS Duration: 91 QT Interval:  382 QTC Calculation: 470 R Axis:   106 Text Interpretation:  Atrial fibrillation Right axis deviation Nonspecific  T abnormalities, anterior leads No significant change since last tracing  Confirmed by Maryan Rued  MD, Loree Fee ([id number]) on 06/10/2015 6:23:16 PM      MDM   Final diagnoses:  None    Patient presenting today with 6 months of intermittent diarrhea since she left a nursing facility. The diarrhea has been worse in the last 4 days.  Patient was seen in urgent care and sent over here for further evaluation due to hypotension. Patient denies any lightheadedness, weakness, near syncope, chest pain or shortness of breath. She is eating very little because every time she eats she gets diarrhea. She spoke with her doctor who called in a prescription for Cipro and Flagyl on Sunday for concern for possible diverticulitis. However patient denies any abdominal pain and on exam her abdomen is soft without localized tenderness or concern for diverticulitis at this time.  Looking back patient has never given a stool sample to check for C. difficile especially since the diarrhea started right after coming out of a nursing facility that would be a concern. To get a stool sample.  Acute abdominal imaging is negative for any acute findings except collection of air within an anterior abdominal wall hernia which is consistent with exam.  Patient received IV fluids by EMS in route here and there her blood pressure is greater than 100 and she is awake alert and talking  9:35 PM Labs without acute findings.  Stool sample sent.  A.fib here but during last hospitalization pt was noted to have a.fib.  Pt on diltiazim.  CHA2DS2VASc = 7 though poor candidate for Lucas 2/2 h/o falls.  Currently on asa/plavix  Blanchie Dessert, MD 06/10/15 2137

## 2015-06-10 NOTE — Discharge Instructions (Signed)
Atrial Fibrillation  Atrial fibrillation is a condition that causes your heart to beat irregularly. It may also cause your heart to beat faster than normal. Atrial fibrillation can prevent your heart from pumping blood normally. It increases your risk of stroke and heart problems.  HOME CARE  · Take medications as told by your doctor.  · Only take medications that your doctor says are safe. Some medications can make the condition worse or happen again.  · If blood thinners were prescribed by your doctor, take them exactly as told. Too much can cause bleeding. Too little and you will not have the needed protection against stroke and other problems.  · Perform blood tests at home if told by your doctor.  · Perform blood tests exactly as told by your doctor.  · Do not drink alcohol.  · Do not drink beverages with caffeine such as coffee, soda, and some teas.  · Maintain a healthy weight.  · Do not use diet pills unless your doctor says they are safe. They may make heart problems worse.  · Follow diet instructions as told by your doctor.  · Exercise regularly as told by your doctor.  · Keep all follow-up appointments.  GET HELP IF:  · You notice a change in the speed, rhythm, or strength of your heartbeat.  · You suddenly begin peeing (urinating) more often.  · You get tired more easily when moving or exercising.  GET HELP RIGHT AWAY IF:   · You have chest or belly (abdominal) pain.  · You feel sick to your stomach (nauseous).  · You are short of breath.  · You suddenly have swollen feet and ankles.  · You feel dizzy.  · You face, arms, or legs feel numb or weak.  · There is a change in your vision or speech.  MAKE SURE YOU:   · Understand these instructions.  · Will watch your condition.  · Will get help right away if you are not doing well or get worse.  Document Released: 08/22/2008 Document Revised: 03/30/2014 Document Reviewed: 12/24/2012  [employer]® Patient Information ©2015 [employer]. This information is not  intended to replace advice given to you by your health care provider. Make sure you discuss any questions you have with your health care provider.

## 2015-06-10 NOTE — ED Notes (Signed)
Pt. Presents following visit to [facility] for intermittent diarrhea x 6 months. Pt. With hx of diverticulitis and started on abx for txt Monday. Pt. Denies pain, is not symptomatic. No diarrhea today. Pt. BP 78/48, given 1L NS by EMS. BP now 110/68.

## 2015-06-10 NOTE — ED Notes (Signed)
Pt reports diarrhea for 6 months, Dr. Unk Pinto is PCP. Has been treating pt with immodium, antibiotics with no relief. Denies pain is a x 4. PCP sent here for evaluation for diverticulitis. Pt is a x 4

## 2015-06-10 NOTE — ED Notes (Addendum)
Called pt to go back to room and no answer.

## 2015-06-11 LAB — CBC
HCT: 38.5 % (ref 36.0–46.0)
Hemoglobin: 12.7 g/dL (ref 12.0–15.0)
MCH: 30.9 pg (ref 26.0–34.0)
MCHC: 33 g/dL (ref 30.0–36.0)
MCV: 93.7 fL (ref 78.0–100.0)
Platelets: 221 10*3/uL (ref 150–400)
RBC: 4.11 MIL/uL (ref 3.87–5.11)
RDW: 15.7 % — ABNORMAL HIGH (ref 11.5–15.5)
WBC: 5.4 10*3/uL (ref 4.0–10.5)

## 2015-06-11 LAB — DIFFERENTIAL
BASOS PCT: 0 % (ref 0–1)
Basophils Absolute: 0 10*3/uL (ref 0.0–0.1)
EOS ABS: 0.1 10*3/uL (ref 0.0–0.7)
EOS PCT: 1 % (ref 0–5)
Lymphocytes Relative: 19 % (ref 12–46)
Lymphs Abs: 1 10*3/uL (ref 0.7–4.0)
Monocytes Absolute: 0.4 10*3/uL (ref 0.1–1.0)
Monocytes Relative: 8 % (ref 3–12)
Neutro Abs: 3.8 10*3/uL (ref 1.7–7.7)
Neutrophils Relative %: 72 % (ref 43–77)

## 2015-06-11 LAB — CLOSTRIDIUM DIFFICILE BY PCR: Toxigenic C. Difficile by PCR: NEGATIVE

## 2015-06-14 LAB — I-STAT CHEM 8, ED
BUN: 21 mg/dL — AB (ref 6–20)
Calcium, Ion: 1.15 mmol/L (ref 1.13–1.30)
Chloride: 109 mmol/L (ref 101–111)
Creatinine, Ser: 1.1 mg/dL — ABNORMAL HIGH (ref 0.44–1.00)
Glucose, Bld: 127 mg/dL — ABNORMAL HIGH (ref 65–99)
HEMATOCRIT: 41 % (ref 36.0–46.0)
Hemoglobin: 13.9 g/dL (ref 12.0–15.0)
Potassium: 4.2 mmol/L (ref 3.5–5.1)
Sodium: 142 mmol/L (ref 135–145)
TCO2: 17 mmol/L (ref 0–100)

## 2015-06-15 LAB — GI PATHOGEN PANEL BY PCR, STOOL
C DIFFICILE TOXIN A/B: NOT DETECTED
Campylobacter by PCR: NOT DETECTED
Cryptosporidium by PCR: NOT DETECTED
E COLI (STEC): NOT DETECTED
E COLI 0157 BY PCR: NOT DETECTED
E coli (ETEC) LT/ST: NOT DETECTED
G LAMBLIA BY PCR: NOT DETECTED
Norovirus GI/GII: NOT DETECTED
Rotavirus A by PCR: NOT DETECTED
SHIGELLA BY PCR: NOT DETECTED
Salmonella by PCR: NOT DETECTED

## 2015-06-18 ENCOUNTER — Ambulatory Visit (INDEPENDENT_AMBULATORY_CARE_PROVIDER_SITE_OTHER): Payer: Medicare Other | Admitting: Internal Medicine

## 2015-06-18 ENCOUNTER — Encounter: Payer: Self-pay | Admitting: Internal Medicine

## 2015-06-18 VITALS — BP 94/66 | HR 88 | Temp 97.9°F | Resp 16 | Ht 62.5 in | Wt 146.6 lb

## 2015-06-18 DIAGNOSIS — E559 Vitamin D deficiency, unspecified: Secondary | ICD-10-CM | POA: Diagnosis not present

## 2015-06-18 DIAGNOSIS — R7309 Other abnormal glucose: Secondary | ICD-10-CM

## 2015-06-18 DIAGNOSIS — E785 Hyperlipidemia, unspecified: Secondary | ICD-10-CM | POA: Diagnosis not present

## 2015-06-18 DIAGNOSIS — K219 Gastro-esophageal reflux disease without esophagitis: Secondary | ICD-10-CM

## 2015-06-18 DIAGNOSIS — Z79899 Other long term (current) drug therapy: Secondary | ICD-10-CM

## 2015-06-18 DIAGNOSIS — I10 Essential (primary) hypertension: Secondary | ICD-10-CM

## 2015-06-18 DIAGNOSIS — K589 Irritable bowel syndrome without diarrhea: Secondary | ICD-10-CM

## 2015-06-18 LAB — HEMOGLOBIN A1C
Hgb A1c MFr Bld: 6.1 % — ABNORMAL HIGH (ref <5.7)
Mean Plasma Glucose: 128 mg/dL — ABNORMAL HIGH (ref <117)

## 2015-06-18 LAB — CBC WITH DIFFERENTIAL/PLATELET
BASOS ABS: 0.1 10*3/uL (ref 0.0–0.1)
BASOS PCT: 1 % (ref 0–1)
Eosinophils Absolute: 0.2 10*3/uL (ref 0.0–0.7)
Eosinophils Relative: 4 % (ref 0–5)
HCT: 39.5 % (ref 36.0–46.0)
HEMOGLOBIN: 12.7 g/dL (ref 12.0–15.0)
LYMPHS PCT: 21 % (ref 12–46)
Lymphs Abs: 1.3 10*3/uL (ref 0.7–4.0)
MCH: 30.4 pg (ref 26.0–34.0)
MCHC: 32.2 g/dL (ref 30.0–36.0)
MCV: 94.5 fL (ref 78.0–100.0)
MONOS PCT: 8 % (ref 3–12)
MPV: 9.6 fL (ref 8.6–12.4)
Monocytes Absolute: 0.5 10*3/uL (ref 0.1–1.0)
NEUTROS ABS: 4 10*3/uL (ref 1.7–7.7)
NEUTROS PCT: 66 % (ref 43–77)
Platelets: 291 10*3/uL (ref 150–400)
RBC: 4.18 MIL/uL (ref 3.87–5.11)
RDW: 14.9 % (ref 11.5–15.5)
WBC: 6.1 10*3/uL (ref 4.0–10.5)

## 2015-06-18 MED ORDER — HYOSCYAMINE SULFATE 0.125 MG SL SUBL: SUBLINGUAL_TABLET | SUBLINGUAL | Status: DC

## 2015-06-18 NOTE — Patient Instructions (Signed)

## 2015-06-18 NOTE — Progress Notes (Signed)
Patient ID: [name], female   DOB: [birth year], 81 y.o.   MRN: [id number]   This very nice 81 y.o. [facility] presents for 3 month follow up with Hypertension, Hyperlipidemia, Pre-Diabetes and Vitamin D Deficiency. (accompanied as usual, by [facility] daughter [name]).  Patient also has recent problems with post-prandial diarrheal BM's and was evaluated at the ER because of diarrhea and associated low BP and Stool pathogen panel was done and returned negative. Denies fevers,chills, sweats or rashes. Some loss of appetite. No N/V,.   Patient is treated for HTN & BP has been controlled at home. Today's BP was 94/66 by the nurse and on my recheck was 127/85(p81) and 122/76(p75) sitting & standing, respectfully.  Patient has had no complaints of any cardiac type chest pain, palpitations, dyspnea/orthopnea/PND, dizziness, claudication, or dependent edema.   Hyperlipidemia is controlled with diet & meds. Patient denies myalgias or other med SE's. Last Lipids were  Cholesterol 143; HDL 35*; LDL Cholesterol 78; Triglycerides 149 on 03/10/2015.   Also, the patient has history of PreDiabetes since Oct 2012 with A1c 6.0%  And she has had no symptoms of reactive hypoglycemia, diabetic polys, paresthesias or visual blurring.  Last A1c was  5.9% on  03/10/2015.   Further, the patient also has history of Vitamin D Deficiency and supplements vitamin D without any suspected side-effects. Last vitamin D was  82 on  03/10/2015.  Medication Sig  . aspirin EC 81 MG  Take 81 mg by mouth 2 (two) times daily.   . B Complex-C  Take 1 tablet by mouth daily.    . bumetanide2 MG tablet TAKE 1 TABLET BY MOUTH  DAILY FOR BLOOD PRESSURE  AND FLUID  . Vit D Take 9,000 Units by mouth daily.  . dorzolamide-timolol (COSOPT) 22.3-6.8 MG/ML ophthalmic solution Place 1 drop into both eyes 2 (two) times daily.  . isosorbide mononitrate (IMDUR) 30 MG 24 hr tablet Take 1 tablet by mouth  every night at bedtime  . latanoprost (XALATAN)  0.005 % ophthalmic solution Place 1 drop into both eyes at bedtime.    Marland Kitchen loperamide  2 MG tablet Take 2 mg by mouth 4 (four) times daily as needed for diarrhea or loose stools.  Marland Kitchen losartan  100 MG tablet Take one-half tablet by  mouth twice a day for blood pressure  . meloxicam  15 MG tablet Take 1 tablet by mouth  daily with food as needed  for pain (Patient taking differently: Take 1 tablet by mouth  daily with food for pain)  . metoprolol succinate-XL) 100 MG 24  Take 1 tablet (100 mg total) by mouth 2 (two) times daily.  Marland Kitchen saccharomyces boulardii (FLORASTOR) 250 MG capsule Take 1 capsule (250 mg total) by mouth 2 (two) times daily.  . traMADol \ 50 MG tablet TAKE 1 TABLET BY MOUTH TWICE DAILY AS NEEDED FOR PAIN  . Zinc 50 MG TABS Take 75 mg by mouth daily.  . metoprolol (TOPROL-XL) 200 MG 24 hr tablet Take 1 tablet by mouth  every day   Allergies  Allergen Reactions  . Lactose Intolerance (Gi) Other (See Comments)    unknown   PMHx:   Past Medical History  Diagnosis Date  . Meningitis   . Macular degeneration   . Hypertension   . GERD (gastroesophageal reflux disease)   . Hyperlipidemia   . Elevated hemoglobin A1c   . Cataract   . Cancer 1979 mastectomy  . Glaucoma     Right pupil (  about 9mm) is larger than left pupil (about 27mm) due to surgery in 1973.  Amblyopia in right eye.   Immunization History  Administered Date(s) Administered  . DT 01/21/2014  . Influenza Split 10/13/2013  . Influenza, High Dose Seasonal PF 08/17/2014  . Influenza-Unspecified 11/27/2010  . Pneumococcal-Unspecified 11/27/2000, 11/27/2009  . Td 11/27/2002   Past Surgical History  Procedure Laterality Date  . Abdominal hysterectomy    . Mastectomy    . Coronary stent placement    . Colon resection    . Eye surgery  1973    Dr. Katy Fitch  . Breast surgery Left 1979    mastectomy  . Skin cancer excision  2008     Squamous cell - left thigh   FHx:    Reviewed / unchanged  SHx:    Reviewed /  unchanged  Systems Review:  Constitutional: Denies fever, chills, wt changes, headaches, insomnia, fatigue, night sweats, change in appetite. Eyes: Denies redness, blurred vision, diplopia, discharge, itchy, watery eyes.  ENT: Denies discharge, congestion, post nasal drip, epistaxis, sore throat, earache, hearing loss, dental pain, tinnitus, vertigo, sinus pain, snoring.  CV: Denies chest pain, palpitations, irregular heartbeat, syncope, dyspnea, diaphoresis, orthopnea, PND, claudication or edema. Respiratory: denies cough, dyspnea, DOE, pleurisy, hoarseness, laryngitis, wheezing.  Gastrointestinal: Denies dysphagia, odynophagia, heartburn, reflux, water brash, abdominal pain or cramps, nausea, vomiting, bloating, diarrhea, constipation, hematemesis, melena, hematochezia  or hemorrhoids. Genitourinary: Denies dysuria, frequency, urgency, nocturia, hesitancy, discharge, hematuria or flank pain. Musculoskeletal: Denies arthralgias, myalgias, stiffness, jt. swelling, pain, limping or strain/sprain.  Skin: Denies pruritus, rash, hives, warts, acne, eczema or change in skin lesion(s). Neuro: No weakness, tremor, incoordination, spasms, paresthesia or pain. Psychiatric: Denies confusion, memory loss or sensory loss. Endo: Denies change in weight, skin or hair change.  Heme/Lymph: No excessive bleeding, bruising or enlarged lymph nodes.  Physical Exam  BP 94/66   Pulse 88  Temp 97.9 F   Resp 16  Ht 5' 2.5"   Wt 146 lb 9.6 oz     BMI 26.37   Appears well nourished and in no distress. Eyes: PERRLA, EOMs, conjunctiva no swelling or erythema. Sinuses: No frontal/maxillary tenderness ENT/Mouth: EAC's clear, TM's nl w/o erythema, bulging. Nares clear w/o erythema, swelling, exudates. Oropharynx clear without erythema or exudates. Oral hygiene is good. Tongue normal, non obstructing. Hearing intact.  Neck: Supple. Thyroid nl. Car 2+/2+ without bruits, nodes or JVD. Chest: Respirations nl with BS  clear & equal w/o rales, rhonchi, wheezing or stridor.  Cor: Heart sounds normal w/ regular rate and rhythm without sig. murmurs, gallops, clicks, or rubs. Peripheral pulses normal and equal  without edema.  Abdomen: Soft & bowel sounds normal. Non-tender w/o guarding, rebound, hernias, masses, or organomegaly.  Lymphatics: Unremarkable.  Musculoskeletal: Full ROM all peripheral extremities, joint stability, 5/5 strength, and normal gait.  Skin: Warm, dry without exposed rashes, lesions or ecchymosis apparent.  Neuro: Cranial nerves intact, reflexes equal bilaterally. Sensory-motor testing grossly intact. Tendon reflexes grossly intact.  Pysch: Alert & oriented x 3.  Insight and judgement nl & appropriate. No ideations.  Assessment and Plan:  1. Essential hypertension  - TSH  2. Hyperlipidemia  - Lipid panel  3. PreDiabetes  - Hemoglobin A1c - Insulin, random  4. Vitamin D deficiency  - Vit D  25 hydroxy   5. Gastroesophageal reflux disease   6. IBS (irritable bowel syndrome) - Diarrhea  - hyoscyamine (LEVSIN SL) 0.125 MG SL tablet; Take 1 to 2 tablets 3 x  day before meals for cramping of diarrhea  Dispense: 90 tablet; Refill: 0 - asked to stop several of her non-essential  Supplements - ROV 10 days to f/u.  7. Medication management  - CBC with Differential/Platelet - BASIC METABOLIC PANEL WITH GFR - Hepatic function panel - Magnesium   Recommended regular exercise, BP monitoring, weight control, and discussed med and SE's. Recommended labs to assess and monitor clinical status. Further disposition pending results of labs. Over 30 minutes of exam, counseling, chart review was performed

## 2015-06-19 LAB — HEPATIC FUNCTION PANEL
ALT: 17 U/L (ref 0–35)
AST: 16 U/L (ref 0–37)
Albumin: 3.6 g/dL (ref 3.5–5.2)
Alkaline Phosphatase: 74 U/L (ref 39–117)
BILIRUBIN DIRECT: 0.1 mg/dL (ref 0.0–0.3)
BILIRUBIN INDIRECT: 0.5 mg/dL (ref 0.2–1.2)
BILIRUBIN TOTAL: 0.6 mg/dL (ref 0.2–1.2)
TOTAL PROTEIN: 6.1 g/dL (ref 6.0–8.3)

## 2015-06-19 LAB — TSH: TSH: 2.797 u[IU]/mL (ref 0.350–4.500)

## 2015-06-19 LAB — LIPID PANEL
CHOL/HDL RATIO: 4.2 ratio
CHOLESTEROL: 146 mg/dL (ref 0–200)
HDL: 35 mg/dL — AB (ref >=46)
LDL Cholesterol: 80 mg/dL (ref 0–99)
Triglycerides: 157 mg/dL — ABNORMAL HIGH (ref <150)
VLDL: 31 mg/dL (ref 0–40)

## 2015-06-19 LAB — BASIC METABOLIC PANEL WITH GFR
BUN: 16 mg/dL (ref 6–23)
CALCIUM: 9.5 mg/dL (ref 8.4–10.5)
CO2: 28 meq/L (ref 19–32)
CREATININE: 0.93 mg/dL (ref 0.50–1.10)
Chloride: 106 mEq/L (ref 96–112)
GFR, EST AFRICAN AMERICAN: 62 mL/min
GFR, Est Non African American: 54 mL/min — ABNORMAL LOW
GLUCOSE: 79 mg/dL (ref 70–99)
Potassium: 4.3 mEq/L (ref 3.5–5.3)
SODIUM: 143 meq/L (ref 135–145)

## 2015-06-19 LAB — VITAMIN D 25 HYDROXY (VIT D DEFICIENCY, FRACTURES): VIT D 25 HYDROXY: 62 ng/mL (ref 30–100)

## 2015-06-19 LAB — MAGNESIUM: Magnesium: 2 mg/dL (ref 1.5–2.5)

## 2015-06-21 ENCOUNTER — Telehealth: Payer: Self-pay | Admitting: *Deleted

## 2015-06-21 LAB — INSULIN, RANDOM: Insulin: 4.3 u[IU]/mL (ref 2.0–19.6)

## 2015-06-21 NOTE — Telephone Encounter (Signed)
Pt aware of lab results Spoke with patient's grand  daughter.

## 2015-06-29 ENCOUNTER — Encounter: Payer: Self-pay | Admitting: Internal Medicine

## 2015-06-29 ENCOUNTER — Ambulatory Visit (INDEPENDENT_AMBULATORY_CARE_PROVIDER_SITE_OTHER): Payer: Medicare Other | Admitting: Internal Medicine

## 2015-06-29 VITALS — BP 132/84 | HR 80 | Temp 97.1°F | Resp 16 | Ht 62.5 in | Wt 146.2 lb

## 2015-06-29 DIAGNOSIS — I2510 Atherosclerotic heart disease of native coronary artery without angina pectoris: Secondary | ICD-10-CM

## 2015-06-29 DIAGNOSIS — J209 Acute bronchitis, unspecified: Secondary | ICD-10-CM

## 2015-06-29 DIAGNOSIS — K589 Irritable bowel syndrome without diarrhea: Secondary | ICD-10-CM

## 2015-06-29 MED ORDER — AZITHROMYCIN 250 MG PO TABS: ORAL_TABLET | ORAL | Status: DC

## 2015-06-29 NOTE — Progress Notes (Signed)
Subjective:    Patient ID: [name], female    DOB: 1922-03-29, 81 y.o.   MRN: [id number]  HPI This very nice 81 yo WWF is brought in by [employer] for concern regarding her ability to continue independent living. She was noted to have congestion this am & possible wheezing. Apparently she slipped & fell a couple of days ago w/o significant injury and was unable to get up alone and had called her Gdaughter & a neighbor. She does not routinely wear her life alert. 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  Patient does report a minimally productive coyugh w/o dyspnea, fever or chills.   Medication Sig  . aspirin EC 81 MG tablet Take 81 mg by mouth 2 (two) times daily.   . B Complex-C (B-COMPLEX WITH VITAMIN C) tablet Take 1 tablet by mouth daily.    . bumetanide (BUMEX) 2 MG tablet TAKE 1 TABLET BY MOUTH  DAILY FOR BLOOD PRESSURE  AND FLUID  . CHOLECALCIFEROL PO Take 9,000 Units by mouth daily.  . dorzolamide-timolol (COSOPT) 22.3-6.8 MG/ML ophthalmic solution Place 1 drop into both eyes 2 (two) times daily.  . hyoscyamine (LEVSIN SL) 0.125 MG SL tablet Take 1 to 2 tablets 3 x day before meals for cramping of diarrhea  . isosorbide mononitrate (IMDUR) 30 MG 24 hr tablet Take 1 tablet by mouth  every night at bedtime  . latanoprost (XALATAN) 0.005 % ophthalmic solution Place 1 drop into both eyes at bedtime.    Marland Kitchen loperamide (IMODIUM A-D) 2 MG tablet Take 2 mg by mouth 4 (four) times daily as needed for diarrhea or loose stools.  Marland Kitchen losartan (COZAAR) 100 MG tablet Take one-half tablet by  mouth twice a day for blood pressure  . meloxicam (MOBIC) 15 MG tablet Take 1 tablet by mouth  daily with food as needed  for pain (Patient taking differently: Take 1 tablet by mouth  daily with food for pain)  . metoprolol (TOPROL-XL) 100 MG 24 hr tablet Take 1 tablet (100 mg total) by mouth 2 (two) times daily.   Marland Kitchen saccharomyces boulardii (FLORASTOR) 250 MG capsule Take 1 capsule (250 mg total) by mouth 2 (two) times daily.  . traMADol (ULTRAM) 50 MG tablet TAKE 1 TABLET BY MOUTH TWICE DAILY AS NEEDED FOR PAIN  . Zinc 50 MG TABS Take 75 mg by mouth daily.   Allergies  Allergen Reactions  . Lactose Intolerance (Gi) Other (See Comments)    unknown   Past Medical History  Diagnosis Date  . Meningitis   . Macular degeneration   . Hypertension   . GERD (gastroesophageal reflux disease)   . Hyperlipidemia   . Elevated hemoglobin A1c   . Cataract   . Cancer 1979 mastectomy  . Glaucoma     Right pupil (about 58mm) is larger than left pupil (about 57mm) due to surgery in 1973.  Amblyopia in right eye.   Past Surgical History  Procedure Laterality Date  . Abdominal hysterectomy    . Mastectomy    . Coronary stent placement    . Colon resection    . Eye surgery  1973    Dr. Katy Fitch  . Breast surgery Left 1979    mastectomy  . Skin cancer excision  2008     Squamous cell - left thigh   Review of Systems 10 point systems review negative except as above.  Objective:   Physical Exam  BP 132/84 mmHg  Pulse 80  Temp(Src) 97.1 F (36.2 C)  Resp 16  Ht 5' 2.5" (1.588 m)  Wt 146 lb 3.2 oz (66.316 kg)  BMI 26.30 kg/m2  Skin - clear w/o rash , cyanosis, icterus or clubbing. HEENT - Eac's patent. TM's Nl. EOM's full. PERRLA. NasoOroPharynx clear. Neck - supple. Nl Thyroid. Carotids 2+ & No bruits, nodes, JVD Chest - few scattered Rales & no rhonchi, wheezes. Cor - Nl HS. RRR w/o sig MGR. PP 1(+). No edema. MS- FROM w/o deformities. Muscle power, tone and bulk Nl. Gait Nl. Neuro - No obvious Cr N abnormalities. Sensory, motor and Cerebellar functions appear Nl w/o focal abnormalities. Psyche - Mental status normal & appropriate.  No delusions, ideations or obvious mood abnormalities.    Assessment & Plan:   1. Acute bronchitis, unspecified organism  - azithromycin (ZITHROMAX) 250 MG  tablet; Take 2 tablets (500 mg) on  Day 1,  followed by 1 tablet (250 mg) once daily on Days 2 through 5.  Dispense: 6 each; Refill: 1  2. IBS (irritable bowel syndrome)   - discussed meds/SE's. Also discussed with patient & Gkid  Considering moving to an Apt/AL domocile.  - ROV 2 weeks to re-assess status

## 2015-06-29 NOTE — Patient Instructions (Signed)
Suggest take the Imodium (Loperamide) 3 x day with meals   And  The Levsin (Hyoscyamine) 3 x day with meals also  Diet and Irritable Bowel Syndrome  No cure has been found for irritable bowel syndrome (IBS). Many options are available to treat the symptoms. Your caregiver will give you the best treatments available for your symptoms. He or she will also encourage you to manage stress and to make changes to your diet. You need to work with your caregiver and Registered Dietician to find the best combination of medicine, diet, counseling, and support to control your symptoms. The following are some diet suggestions. FOODS THAT MAKE IBS WORSE  Fatty foods, such as [place] fries.  Milk products, such as cheese or ice cream.  Chocolate.  Alcohol.  Caffeine (found in coffee and some sodas).  Carbonated drinks, such as soda. If certain foods cause symptoms, you should eat less of them or stop eating them. FOOD JOURNAL   Keep a journal of the foods that seem to cause distress. Write down:  What you are eating during the day and when.  What problems you are having after eating.  When the symptoms occur in relation to your meals.  What foods always make you feel badly.  Take your notes with you to your caregiver to see if you should stop eating certain foods. FOODS THAT MAKE IBS BETTER Fiber reduces IBS symptoms, especially constipation, because it makes stools soft, bulky, and easier to pass. Fiber is found in bran, bread, cereal, beans, fruit, and vegetables. Examples of foods with fiber include:  Apples.  Peaches.  Pears.  Berries.  Figs.  Broccoli, raw.  Cabbage.  Carrots.  Raw peas.  Kidney beans.  Lima beans.  Whole-grain bread.  Whole-grain cereal. Add foods with fiber to your diet a little at a time. This will let your body get used to them. Too much fiber at once might cause gas and swelling of your abdomen. This can trigger symptoms in a person with IBS.  Caregivers usually recommend a diet with enough fiber to produce soft, painless bowel movements. High fiber diets may cause gas and bloating. However, these symptoms often go away within a few weeks, as your body adjusts. In many cases, dietary fiber may lessen IBS symptoms, particularly constipation. However, it may not help pain or diarrhea. High fiber diets keep the colon mildly enlarged (distended) with the added fiber. This may help prevent spasms in the colon. Some forms of fiber also keep water in the stool, thereby preventing hard stools that are difficult to pass.  Besides telling you to eat more foods with fiber, your caregiver may also tell you to get more fiber by taking a fiber pill or drinking water mixed with a special high fiber powder. An example of this is a natural fiber laxative containing psyllium seed.  TIPS  Large meals can cause cramping and diarrhea in people with IBS. If this happens to you, try eating 4 or 5 small meals a day, or try eating less at each of your usual 3 meals. It may also help if your meals are low in fat and high in carbohydrates. Examples of carbohydrates are pasta, rice, whole-grain breads and cereals, fruits, and vegetables.  If dairy products cause your symptoms to flare up, you can try eating less of those foods. You might be able to handle yogurt better than other dairy products, because it contains bacteria that helps with digestion. Dairy products are an important source  of calcium and other nutrients. If you need to avoid dairy products, be sure to talk with a Registered Dietitian about getting these nutrients through other food sources.  Drink enough water and fluids to keep your urine clear or pale yellow. This is important, especially if you have diarrhea.   Irritable Bowel Syndrome Irritable bowel syndrome (IBS) is caused by a disturbance of normal bowel function and is a common digestive disorder. You may also hear this condition called spastic  colon, mucous colitis, and irritable colon. There is no cure for IBS. However, symptoms often gradually improve or disappear with a good diet, stress management, and medicine. This condition usually appears in late adolescence or early adulthood. Women develop it twice as often as men. CAUSES  After food has been digested and absorbed in the small intestine, waste material is moved into the large intestine, or colon. In the colon, water and salts are absorbed from the undigested products coming from the small intestine. The remaining residue, or fecal material, is held for elimination. Under normal circumstances, gentle, rhythmic contractions of the bowel walls push the fecal material along the colon toward the rectum. In IBS, however, these contractions are irregular and poorly coordinated. The fecal material is either retained too long, resulting in constipation, or expelled too soon, producing diarrhea. SIGNS AND SYMPTOMS  The most common symptom of IBS is abdominal pain. It is often in the lower left side of the abdomen, but it may occur anywhere in the abdomen. The pain comes from spasms of the bowel muscles happening too much and from the buildup of gas and fecal material in the colon. This pain:  Can range from sharp abdominal cramps to a dull, continuous ache.  Often worsens soon after eating.  Is often relieved by having a bowel movement or passing gas. Abdominal pain is usually accompanied by constipation, but it may also produce diarrhea. The diarrhea often occurs right after a meal or upon waking up in the morning. The stools are often soft, watery, and flecked with mucus. Other symptoms of IBS include:  Bloating.  Loss of appetite.  Heartburn.  Backache.  Dull pain in the arms or shoulders.  Nausea.  Burping.  Vomiting.  Gas. IBS may also cause symptoms that are unrelated to the digestive system, such as:  Fatigue.  Headaches.  Anxiety.  Shortness of  breath.  Trouble concentrating.  Dizziness. These symptoms tend to come and go. DIAGNOSIS  The symptoms of IBS may seem like symptoms of other, more serious digestive disorders. Your health care provider may want to perform tests to exclude these disorders.  TREATMENT Many medicines are available to help correct bowel function or relieve bowel spasms and abdominal pain. Among the medicines available are:  Laxatives for severe constipation and to help restore normal bowel habits.  Specific antidiarrheal medicines to treat severe or lasting diarrhea.  Antispasmodic agents to relieve intestinal cramps. Your health care provider may also decide to treat you with a mild tranquilizer or sedative during unusually stressful periods in your life. Your health care provider may also prescribe antidepressant medicine. The use of this medicine has been shown to reduce pain and other symptoms of IBS. Remember that if any medicine is prescribed for you, you should take it exactly as directed. Make sure your health care provider knows how well it worked for you. HOME CARE INSTRUCTIONS   Take all medicines as directed by your health care provider.  Avoid foods that are high in fat  or oils, such as heavy cream, butter, frankfurters, sausage, and other fatty meats.  Avoid foods that make you go to the bathroom, such as fruit, fruit juice, and dairy products.  Cut out carbonated drinks, chewing gum, and "gassy" foods such as beans and cabbage. This may help relieve bloating and burping.  Eat foods with bran, and drink plenty of liquids with the bran foods. This helps relieve constipation.  Keep track of what foods seem to bring on your symptoms.  Avoid emotionally charged situations or circumstances that produce anxiety.  Start or continue exercising.  Get plenty of rest and sleep. Document Released: 11/13/2005 Document Revised: 11/18/2013 Document Reviewed: 07/03/2008 [facility] Patient Information  2015 [place], [place]. This information is not intended to replace advice given to you by your health care provider. Make sure you discuss any questions you have with your health care provider.

## 2015-06-30 ENCOUNTER — Other Ambulatory Visit: Payer: Self-pay | Admitting: *Deleted

## 2015-06-30 MED ORDER — BENZONATATE 100 MG PO CAPS: ORAL_CAPSULE | ORAL | Status: DC

## 2015-06-30 NOTE — Telephone Encounter (Signed)
Grand-daughter called and requested medication for cough for patient.  OK to send in RX for [employer] per Dr Melford Aase.

## 2015-07-05 ENCOUNTER — Telehealth: Payer: Self-pay | Admitting: *Deleted

## 2015-07-05 NOTE — Telephone Encounter (Signed)
Patient's grand-daughter callled and states patient's left arm is swollen,but no pain.  Per Dr Melford Aase,  There is nothing to do if no pain.  Patient will try elevating her arm to see if swelling goes down and will call back PRN.

## 2015-07-06 ENCOUNTER — Telehealth: Payer: Self-pay | Admitting: *Deleted

## 2015-07-06 NOTE — Telephone Encounter (Signed)
[name], grand-daughter, called and states patient is still having a productive cough.  OK to refill the Z-pak from 06/29/2015.

## 2015-07-15 ENCOUNTER — Ambulatory Visit (INDEPENDENT_AMBULATORY_CARE_PROVIDER_SITE_OTHER): Payer: Medicare Other | Admitting: Physician Assistant

## 2015-07-15 ENCOUNTER — Encounter: Payer: Self-pay | Admitting: Internal Medicine

## 2015-07-15 VITALS — BP 122/80 | HR 88 | Temp 97.9°F | Resp 16 | Ht 62.5 in | Wt 146.4 lb

## 2015-07-15 DIAGNOSIS — R6889 Other general symptoms and signs: Secondary | ICD-10-CM | POA: Diagnosis not present

## 2015-07-15 DIAGNOSIS — Z1331 Encounter for screening for depression: Secondary | ICD-10-CM

## 2015-07-15 DIAGNOSIS — R7309 Other abnormal glucose: Secondary | ICD-10-CM

## 2015-07-15 DIAGNOSIS — Z6826 Body mass index (BMI) 26.0-26.9, adult: Secondary | ICD-10-CM

## 2015-07-15 DIAGNOSIS — I10 Essential (primary) hypertension: Secondary | ICD-10-CM

## 2015-07-15 DIAGNOSIS — Z9181 History of falling: Secondary | ICD-10-CM

## 2015-07-15 DIAGNOSIS — K219 Gastro-esophageal reflux disease without esophagitis: Secondary | ICD-10-CM

## 2015-07-15 DIAGNOSIS — R296 Repeated falls: Secondary | ICD-10-CM

## 2015-07-15 DIAGNOSIS — I2510 Atherosclerotic heart disease of native coronary artery without angina pectoris: Secondary | ICD-10-CM

## 2015-07-15 DIAGNOSIS — H409 Unspecified glaucoma: Secondary | ICD-10-CM

## 2015-07-15 DIAGNOSIS — Z1389 Encounter for screening for other disorder: Secondary | ICD-10-CM

## 2015-07-15 DIAGNOSIS — I4892 Unspecified atrial flutter: Secondary | ICD-10-CM

## 2015-07-15 DIAGNOSIS — E785 Hyperlipidemia, unspecified: Secondary | ICD-10-CM

## 2015-07-15 DIAGNOSIS — Z0001 Encounter for general adult medical examination with abnormal findings: Secondary | ICD-10-CM

## 2015-07-15 DIAGNOSIS — E559 Vitamin D deficiency, unspecified: Secondary | ICD-10-CM

## 2015-07-15 DIAGNOSIS — Z79899 Other long term (current) drug therapy: Secondary | ICD-10-CM

## 2015-07-15 DIAGNOSIS — R4182 Altered mental status, unspecified: Secondary | ICD-10-CM

## 2015-07-15 DIAGNOSIS — Z0000 Encounter for general adult medical examination without abnormal findings: Secondary | ICD-10-CM | POA: Insufficient documentation

## 2015-07-15 NOTE — Patient Instructions (Addendum)
Please cut the losartan in half , a low BP in older patients can lead to falls/confusion Monitor BP, if still below 130/80 then stop this medication all together   Please monitor your blood pressure, as we get older our body can not respond to a low blood pressure as well as it did when we were younger, for this reason we want a bit higher of a blood pressure as you get older to avoid dizziness and fatigue which can lead to falls. [place] call if your blood pressure is consistently above 150/90.    Please monitor your blood pressure. If it is getting below 130/80 AND you are having fatigue with exertion, dizziness we may need to cut your blood pressure medication in half. Please call the office if this is happening. Hypotension As your heart beats, it forces blood through your body. This force is called blood pressure. If you have hypotension, you have low blood pressure. When your blood pressure is too low, you may not get enough blood to your brain. You may feel weak, feel lightheaded, have a fast heartbeat, or even pass out (faint). HOME CARE  Drink enough fluids to keep your pee (urine) clear or pale yellow.  Take all medicines as told by your doctor.  Get up slowly after sitting or lying down.  Wear support stockings as told by your doctor.  Maintain a healthy diet by including foods such as fruits, vegetables, nuts, whole grains, and lean meats. GET HELP IF:  You are throwing up (vomiting) or have watery poop (diarrhea).  You have a fever for more than 2-3 days.  You feel more thirsty than usual.  You feel weak and tired. GET HELP RIGHT AWAY IF:   You pass out (faint).  You have chest pain or a fast or irregular heartbeat.  You lose feeling in part of your body.  You cannot move your arms or legs.  You have trouble speaking.  You get sweaty or feel lightheaded. MAKE SURE YOU:   Understand these instructions.  Will watch your condition.  Will get help right away if  you are not doing well or get worse. Document Released: 02/07/2010 Document Revised: 07/16/2013 Document Reviewed: 05/16/2013 [facility] Patient Information 2015 [place], [place]. This information is not intended to replace advice given to you by your health care provider. Make sure you discuss any questions you have with your health care provider.

## 2015-07-15 NOTE — Progress Notes (Signed)
MEDICARE ANNUAL WELLNESS VISIT AND FOLLOW UP  Assessment:   1. At high risk for falls Continue walker, fall risk discussed, will decrease BP meds due to hypotension, goal closer to 140/90 due to age. Going to assisted NH  2. Essential hypertension - continue medications but cut back on losartan to 50mg  daily and may discontinue, may be contributing to cough as well, DASH diet, exercise and monitor at home. Call if greater than 130/80.   3. PreDiabetes Discussed general issues about diabetes pathophysiology and management., Educational material distributed., Suggested low cholesterol diet., Encouraged aerobic exercise., Discussed foot care., Reminded to get yearly retinal exam.  4. Atrial flutter, unspecified Continue BB, monitor  5. Gastroesophageal reflux disease, esophagitis presence not specified Continue PPI/H2 blocker, diet discussed  6. Vitamin D deficiency Continue supplement  7. Medication management  8. Hyperlipidemia -continue medications, decrease fatty foods, increase activity.   9. Glaucoma monitor  10. Altered mental status, unspecified altered mental status type Improved with zpak, declines labs today, will monitor.   11. Depression screen negative  12. Encounter for Medicare annual wellness exam   Over 30 minutes of exam, counseling, chart review, and critical decision making was performed PATIENT AND GRANDDAUGHTER DECLINES LABS TODAY, NORMAL FROM 1 MONTH AGO COME BACK FOR PPD AND TO FILL OUT FORMS FOR NH Future Appointments Date Time Provider [place]  07/16/2015 10:00 AM [facility] NURSE [facility] None  12/20/2015 10:00 AM Unk Pinto, MD [facility] None    Plan:   During the course of the visit the patient was educated and counseled about appropriate screening and preventive services including:    Pneumococcal vaccine   Influenza vaccine  Td vaccine  Prevnar 13  Screening electrocardiogram  Screening mammography  Bone  densitometry screening  Colorectal cancer screening  Diabetes screening  Glaucoma screening  Nutrition counseling   Advanced directives: given info/requested copies  Conditions/risks identified: Diabetes is at goal, ACE/ARB therapy: Yes. Urinary Incontinence is an issue: discussed non pharmacology and pharmacology options.  Fall risk: high- discussed PT, home fall assessment, medications.    Subjective:   [name] is a 81 y.o. female who presents for Medicare Annual Wellness Visit and 3 month follow up on hypertension, prediabetes, hyperlipidemia, vitamin D def.  Date of last medicare wellness visit is unknown.   Her blood pressure has been controlled at home, she is only on the bumex AS needed and has not taken in a month, today their BP is BP: 122/80 mmHg She does not workout. She denies chest pain, shortness of breath,. She has had dizziness with fall yesterday, no CP, SOB.   Her granddaughter, [name], is with her, she has help coming in to her house but she has had increasing difficulty with ADLs and confusion so she has agreed to go into an assisted living home.  She walks with a walker and has had multiple falls, she is a high fall risk..  She continues to have a cough, worse in the morning but has improved during the day with the zpak x 2. No mucus, no wheezing or SOB.  Has had diarrhea but has improved significantly. She has decreased appetite.  Someone was trying to help her up and pulled on her left arm, she had subsequent swelling, no warmth, redness. It is slowly improving.  She is not on cholesterol medication due to age.  Her cholesterol is at goal. The cholesterol last visit was:   Lab Results  Component Value Date   CHOL 146 06/18/2015  HDL 35* 06/18/2015   LDLCALC 80 06/18/2015   TRIG 157* 06/18/2015   CHOLHDL 4.2 06/18/2015   She has been working on diet and exercise for prediabetes, and denies paresthesia of the feet, polydipsia, polyuria and  visual disturbances. Last A1C in the office was:  Lab Results  Component Value Date   HGBA1C 6.1* 06/18/2015   Patient is on Vitamin D supplement. Lab Results  Component Value Date   VD25OH 62 06/18/2015      Medication Review Current Outpatient Prescriptions on File Prior to Visit  Medication Sig Dispense Refill  . aspirin EC 81 MG tablet Take 81 mg by mouth 2 (two) times daily.     . B Complex-C (B-COMPLEX WITH VITAMIN C) tablet Take 1 tablet by mouth daily.      . benzonatate (TESSALON PERLES) 100 MG capsule Take 1-2 perles tid to prevent cough. 60 capsule 0  . bumetanide (BUMEX) 2 MG tablet TAKE 1 TABLET BY MOUTH  DAILY FOR BLOOD PRESSURE  AND FLUID 90 tablet 99  . CHOLECALCIFEROL PO Take 9,000 Units by mouth daily.    . dorzolamide-timolol (COSOPT) 22.3-6.8 MG/ML ophthalmic solution Place 1 drop into both eyes 2 (two) times daily.  0  . isosorbide mononitrate (IMDUR) 30 MG 24 hr tablet Take 1 tablet by mouth  every night at bedtime 15 tablet 0  . latanoprost (XALATAN) 0.005 % ophthalmic solution Place 1 drop into both eyes at bedtime.      Marland Kitchen losartan (COZAAR) 100 MG tablet Take one-half tablet by  mouth twice a day for blood pressure 90 tablet 99  . meloxicam (MOBIC) 15 MG tablet Take 1 tablet by mouth  daily with food as needed  for pain (Patient taking differently: Take 1 tablet by mouth  daily with food for pain) 90 tablet 1  . metoprolol (TOPROL-XL) 100 MG 24 hr tablet Take 1 tablet (100 mg total) by mouth 2 (two) times daily.    Marland Kitchen saccharomyces boulardii (FLORASTOR) 250 MG capsule Take 1 capsule (250 mg total) by mouth 2 (two) times daily.    . traMADol (ULTRAM) 50 MG tablet TAKE 1 TABLET BY MOUTH TWICE DAILY AS NEEDED FOR PAIN 60 tablet 2  . Zinc 50 MG TABS Take 75 mg by mouth daily.    Marland Kitchen loperamide (IMODIUM A-D) 2 MG tablet Take 2 mg by mouth 4 (four) times daily as needed for diarrhea or loose stools.     No current facility-administered medications on file prior to visit.     Current Problems (verified) Patient Active Problem List   Diagnosis Date Noted  . At high risk for falls 07/15/2015  . Essential hypertension   . Type 2 diabetes mellitus without complication   . Atrial flutter, unspecified   . Altered mental status 12/27/2014  . Vitamin D deficiency 10/12/2013  . Medication management 10/12/2013  . GERD (gastroesophageal reflux disease)   . Hyperlipidemia   . PreDiabetes   . Glaucoma     Screening Tests Immunization History  Administered Date(s) Administered  . DT 01/21/2014  . Influenza Split 10/13/2013  . Influenza, High Dose Seasonal PF 08/17/2014  . Influenza-Unspecified 11/27/2010  . Pneumococcal-Unspecified 11/27/2000, 11/27/2009  . Td 11/27/2002    Preventative care: Last colonoscopy: 2008 Last mammogram: 09/2014 Last pap smear/pelvic exam: remote  DEXA: never  Prior vaccinations: TD or Tdap: 2015  Influenza: 2015 Pneumococcal: 2011 Prevnar13: DUE Shingles/Zostavax: declines  Names of Other Physician/Practitioners you currently use: 1. [facility]- here for primary care 2. Dr. Katy Fitch, eye doctor, last visit q year 3. Dr. Rachel Bo, dentist, last visit q 3 months Patient Care Team: Unk Pinto, MD as PCP - General (Internal Medicine) Clent Jacks, MD as Consulting Physician (Ophthalmology) Jari Pigg, MD as Consulting Physician (Dermatology) Milus Banister, MD as Attending Physician (Gastroenterology)  Past Surgical History  Procedure Laterality Date  . Abdominal hysterectomy    . Mastectomy    . Coronary stent placement    . Colon resection    . Eye surgery  1973    Dr. Katy Fitch  . Breast surgery Left 1979    mastectomy  . Skin cancer excision  2008     Squamous cell - left thigh   Family History  Problem Relation Age of Onset  . Hypertension Mother   . Hypertension Father   . CVA Father   . Arthritis Sister     Rheumatoid  . Heart attack Brother   . Heart  disease Brother   . Diabetes Daughter   . Hypertension Daughter    Social History  Substance Use Topics  . Smoking status: Never Smoker   . Smokeless tobacco: Never Used  . Alcohol Use: No    MEDICARE WELLNESS OBJECTIVES: Tobacco use: She does not smoke.  Patient is not a former smoker. Alcohol Current alcohol use: none Osteoporosis: postmenopausal estrogen deficiency and dietary calcium and/or vitamin D deficiency, History of fracture in the past year: no Fall risk: High Risk Hearing: impaired, worse left ear Visual acuity: impaired,  does not perform annual eye exam Diet: on average, 1-2 meals per day Physical activity: Current Exercise Habits:: The patient does not participate in regular exercise at present Cardiac risk factors: Cardiac Risk Factors include: advanced age (>75men, >47 women);dyslipidemia;hypertension;sedentary lifestyle Depression/mood screen:   Depression screen [facility] 2/9 07/15/2015  Decreased Interest 0  Down, Depressed, Hopeless 0  PHQ - 2 Score 0    ADLs:  In your present state of health, do you have any difficulty performing the following activities: 07/15/2015 12/27/2014  Hearing? Tempie Donning  Vision? Y Y  Difficulty concentrating or making decisions? N N  Walking or climbing stairs? Y N  Dressing or bathing? N N  Doing errands, shopping? Tempie Donning  Preparing Food and eating ? N -  Using the Toilet? Y -  In the past six months, have you accidently leaked urine? Y -  Do you have problems with loss of bowel control? N -  Managing your Medications? N -  Managing your Finances? Y -  Housekeeping or managing your Housekeeping? Y -    Cognitive Testing  Alert? Yes  Normal Appearance?Yes  Oriented to person? Yes  Place? Yes   Time? Yes  Recall of three objects?  No 1/3  Can perform simple calculations? Yes  Displays appropriate judgment?Yes  Can read the correct time from a watch face?Yes  EOL planning: Does patient have an advance directive?: Yes Type of Advance  Directive: Healthcare Power of Attorney, Living will Does patient want to make changes to advanced directive?: No - Patient declined Copy of advanced directive(s) in chart?: No - copy requested   Objective:   Today's Vitals   07/15/15 1111  BP: 122/80  Pulse: 88  Temp: 97.9 F (36.6 C)  Resp: 16  Height: 5' 2.5" (1.588 m)  Weight: 146 lb 6.4 oz (66.407 kg)   Body mass index is 26.33 kg/(m^2).  General appearance: alert, no distress, WD/WN,  female HEENT: normocephalic,  sclerae anicteric, right pupil unchagned at 26mm, left pupil 48mm, TMs pearly, nares patent, no discharge or erythema, pharynx normal Oral cavity: MMM, no lesions Neck: supple, no lymphadenopathy, no thyromegaly, no masses Heart: Irreg irreg, slightly tachy, normal S1, S2, slight blowing murmur Lungs: CTA bilaterally, no wheezes, rhonchi, or rales Abdomen: +bs, distended with left hernia, non tender,  no masses, no hepatomegaly, no splenomegaly Musculoskeletal: nontender,  no obvious deformity Extremities: left arm with swelling at forearm and arm, good distal sensation, pulses.  no cyanosis, no clubbing,  Pulses: 2+ symmetric, upper and lower extremities, normal cap refill Neurological: alert, oriented x 3, CN2-12 intact, decreased strength in upper extremities and lower extremities, DTRs 2+ throughout, no cerebellar signs, gait unsteady with walker Psychiatric: normal affect, behavior normal, pleasant  Breast: defer Gyn: defer Rectal: defer  Medicare Attestation I have personally reviewed: The patient's medical and social history Their use of alcohol, tobacco or illicit drugs Their current medications and supplements The patient's functional ability including ADLs,fall risks, home safety risks, cognitive, and hearing and visual impairment Diet and physical activities Evidence for depression or mood disorders  The patient's weight, height, BMI, and visual acuity have been recorded in the chart.  I have made  referrals, counseling, and provided education to the patient based on review of the above and I have provided the patient with a written personalized care plan for preventive services.     Vicie Mutters, PA-C   07/15/2015

## 2015-07-16 ENCOUNTER — Ambulatory Visit (INDEPENDENT_AMBULATORY_CARE_PROVIDER_SITE_OTHER): Payer: Medicare Other | Admitting: *Deleted

## 2015-07-16 DIAGNOSIS — Z111 Encounter for screening for respiratory tuberculosis: Secondary | ICD-10-CM | POA: Diagnosis not present

## 2015-07-19 ENCOUNTER — Ambulatory Visit: Payer: Medicare Other

## 2015-07-19 LAB — TB SKIN TEST
Induration: 0 mm
TB SKIN TEST: NEGATIVE

## 2015-09-02 ENCOUNTER — Other Ambulatory Visit: Payer: Self-pay

## 2015-09-02 DIAGNOSIS — Z1231 Encounter for screening mammogram for malignant neoplasm of breast: Secondary | ICD-10-CM

## 2015-10-19 ENCOUNTER — Ambulatory Visit: Payer: Medicare Other

## 2015-10-25 ENCOUNTER — Ambulatory Visit (INDEPENDENT_AMBULATORY_CARE_PROVIDER_SITE_OTHER): Payer: Medicare Other | Admitting: Physician Assistant

## 2015-10-25 VITALS — BP 124/82 | HR 76 | Temp 97.9°F | Resp 14 | Ht 62.5 in | Wt 135.0 lb

## 2015-10-25 DIAGNOSIS — I4892 Unspecified atrial flutter: Secondary | ICD-10-CM | POA: Diagnosis not present

## 2015-10-25 DIAGNOSIS — R296 Repeated falls: Secondary | ICD-10-CM

## 2015-10-25 DIAGNOSIS — I10 Essential (primary) hypertension: Secondary | ICD-10-CM | POA: Diagnosis not present

## 2015-10-25 DIAGNOSIS — M1712 Unilateral primary osteoarthritis, left knee: Secondary | ICD-10-CM | POA: Diagnosis not present

## 2015-10-25 DIAGNOSIS — I2510 Atherosclerotic heart disease of native coronary artery without angina pectoris: Secondary | ICD-10-CM

## 2015-10-25 DIAGNOSIS — Z9181 History of falling: Secondary | ICD-10-CM

## 2015-10-25 MED ORDER — MELOXICAM 7.5 MG PO TABS: 7.5000 mg | ORAL_TABLET | Freq: Two times a day (BID) | ORAL | Status: DC

## 2015-10-25 NOTE — Progress Notes (Signed)
Subjective:    Patient ID: [name], female    DOB: [birth year], 81 y.o.   MRN: [id number]  HPI 81 y.o. WF with history of HTN, a Flutter presents with joint pain, walks with walker, she is [place] garden. She has left knee and leg pain, mobic does help but wears off at night. Does not want anything sedating. Denies lower back pain, radiation down her leg, new incontinence, fever, chills.    Blood pressure 124/82, pulse 76, temperature 97.9 F (36.6 C), temperature source Temporal, resp. rate 14, height 5' 2.5" (1.588 m), weight 135 lb (61.236 kg), SpO2 96 %.   Wt Readings from Last 3 Encounters:  10/25/15 135 lb (61.236 kg)  07/15/15 146 lb 6.4 oz (66.407 kg)  06/29/15 146 lb 3.2 oz (66.316 kg)    Past Medical History  Diagnosis Date  . Meningitis   . Macular degeneration   . Hypertension   . GERD (gastroesophageal reflux disease)   . Hyperlipidemia   . Elevated hemoglobin A1c   . Cataract   . Cancer 1979 mastectomy  . Glaucoma     Right pupil (about 19mm) is larger than left pupil (about 70mm) due to surgery in 1973.  Amblyopia in right eye.   Current Outpatient Prescriptions on File Prior to Visit  Medication Sig Dispense Refill  . aspirin EC 81 MG tablet Take 81 mg by mouth 2 (two) times daily.     . B Complex-C (B-COMPLEX WITH VITAMIN C) tablet Take 1 tablet by mouth daily.      . bumetanide (BUMEX) 2 MG tablet TAKE 1 TABLET BY MOUTH  DAILY FOR BLOOD PRESSURE  AND FLUID 90 tablet 99  . CHOLECALCIFEROL PO Take 9,000 Units by mouth daily.    . dorzolamide-timolol (COSOPT) 22.3-6.8 MG/ML ophthalmic solution Place 1 drop into both eyes 2 (two) times daily.  0  . isosorbide mononitrate (IMDUR) 30 MG 24 hr tablet Take 1 tablet by mouth  every night at bedtime 15 tablet 0  . latanoprost (XALATAN) 0.005 % ophthalmic solution Place 1 drop into both eyes at bedtime.      Marland Kitchen loperamide (IMODIUM A-D) 2 MG tablet Take 2 mg by mouth 4 (four) times daily as needed for diarrhea or  loose stools.    Marland Kitchen losartan (COZAAR) 100 MG tablet Take one-half tablet by  mouth twice a day for blood pressure 90 tablet 99  . meloxicam (MOBIC) 15 MG tablet Take 1 tablet by mouth  daily with food as needed  for pain (Patient taking differently: Take 1 tablet by mouth  daily with food for pain) 90 tablet 1  . metoprolol (TOPROL-XL) 100 MG 24 hr tablet Take 1 tablet (100 mg total) by mouth 2 (two) times daily.    Marland Kitchen saccharomyces boulardii (FLORASTOR) 250 MG capsule Take 1 capsule (250 mg total) by mouth 2 (two) times daily.    . traMADol (ULTRAM) 50 MG tablet TAKE 1 TABLET BY MOUTH TWICE DAILY AS NEEDED FOR PAIN 60 tablet 2  . Zinc 50 MG TABS Take 75 mg by mouth daily.     No current facility-administered medications on file prior to visit.    Review of Systems  Constitutional: Positive for fatigue. Negative for fever and chills.  HENT: Negative.   Respiratory: Negative.   Cardiovascular: Negative.  Negative for leg swelling.  Gastrointestinal: Negative.   Genitourinary: Negative.   Musculoskeletal: Positive for myalgias and gait problem. Negative for back pain, joint swelling, arthralgias, neck pain  and neck stiffness.  Skin: Negative.   Neurological: Negative for dizziness, weakness, light-headedness and numbness.  Psychiatric/Behavioral: Negative.        Objective:   Physical Exam  Constitutional: She is oriented to person, place, and time. She appears well-developed and well-nourished. No distress.  HENT:  Head: Normocephalic and atraumatic.  Mouth/Throat: Oropharynx is clear and moist. No oropharyngeal exudate.  Eyes: Conjunctivae are normal. No scleral icterus.  Right pupil about 23mm than the left pupil about 33mm  Neck: Normal range of motion. Neck supple. No JVD present. No thyromegaly present.  Cardiovascular: Normal rate, regular rhythm and intact distal pulses.  Exam reveals no gallop and no friction rub.   Murmur heard.   No palpable cords.  Pulmonary/Chest: Effort  normal and breath sounds normal. No respiratory distress. She has no wheezes. She has no rales. She exhibits no tenderness.  Musculoskeletal:       Left hip: She exhibits decreased strength and tenderness. She exhibits normal range of motion, no bony tenderness, no swelling, no crepitus, no deformity and no laceration.       Left knee: She exhibits normal range of motion, no swelling, no effusion, no ecchymosis, no deformity, no laceration, normal alignment, no LCL laxity, normal patellar mobility and no MCL laxity. Tenderness found. Medial joint line and lateral joint line tenderness noted. No patellar tendon tenderness noted.       Left upper leg: She exhibits tenderness. She exhibits no bony tenderness, no swelling, no edema, no deformity and no laceration.  Walks with walker  Lymphadenopathy:    She has no cervical adenopathy.  Neurological: She is alert and oriented to person, place, and time.  Skin: She is not diaphoretic.       Assessment & Plan:   1. Primary osteoarthritis of left knee Will do mobic 7.5mg  BID, continue tramadol PRN, can do PT at assisted living. Call if not better.    Future Appointments Date Time Provider [place]  11/25/2015 9:30 AM [facility] 03 [facility]  12/20/2015 10:00 AM Unk Pinto, MD [facility] None

## 2015-11-25 ENCOUNTER — Ambulatory Visit
Admission: RE | Admit: 2015-11-25 | Discharge: 2015-11-25 | Disposition: A | Discharge status: Home / Self Care | Payer: Medicare Other | Source: Ambulatory Visit

## 2015-11-25 DIAGNOSIS — Z1231 Encounter for screening mammogram for malignant neoplasm of breast: Secondary | ICD-10-CM

## 2015-12-20 ENCOUNTER — Encounter: Payer: Self-pay | Admitting: Internal Medicine

## 2015-12-20 ENCOUNTER — Ambulatory Visit (INDEPENDENT_AMBULATORY_CARE_PROVIDER_SITE_OTHER): Payer: Medicare Other | Admitting: Internal Medicine

## 2015-12-20 VITALS — BP 128/82 | HR 84 | Temp 97.7°F | Resp 16 | Ht 62.5 in | Wt 133.8 lb

## 2015-12-20 DIAGNOSIS — Z79899 Other long term (current) drug therapy: Secondary | ICD-10-CM | POA: Diagnosis not present

## 2015-12-20 DIAGNOSIS — E559 Vitamin D deficiency, unspecified: Secondary | ICD-10-CM | POA: Diagnosis not present

## 2015-12-20 DIAGNOSIS — R7309 Other abnormal glucose: Secondary | ICD-10-CM

## 2015-12-20 DIAGNOSIS — E785 Hyperlipidemia, unspecified: Secondary | ICD-10-CM

## 2015-12-20 DIAGNOSIS — Z789 Other specified health status: Secondary | ICD-10-CM

## 2015-12-20 DIAGNOSIS — D485 Neoplasm of uncertain behavior of skin: Secondary | ICD-10-CM

## 2015-12-20 DIAGNOSIS — Z1389 Encounter for screening for other disorder: Secondary | ICD-10-CM | POA: Diagnosis not present

## 2015-12-20 DIAGNOSIS — I10 Essential (primary) hypertension: Secondary | ICD-10-CM

## 2015-12-20 DIAGNOSIS — Z1212 Encounter for screening for malignant neoplasm of rectum: Secondary | ICD-10-CM

## 2015-12-20 DIAGNOSIS — K219 Gastro-esophageal reflux disease without esophagitis: Secondary | ICD-10-CM

## 2015-12-20 DIAGNOSIS — Z1331 Encounter for screening for depression: Secondary | ICD-10-CM

## 2015-12-20 LAB — CBC WITH DIFFERENTIAL/PLATELET
BASOS PCT: 1 % (ref 0–1)
Basophils Absolute: 0.1 10*3/uL (ref 0.0–0.1)
EOS ABS: 0.1 10*3/uL (ref 0.0–0.7)
EOS PCT: 2 % (ref 0–5)
HCT: 42.6 % (ref 36.0–46.0)
HEMOGLOBIN: 13.9 g/dL (ref 12.0–15.0)
Lymphocytes Relative: 29 % (ref 12–46)
Lymphs Abs: 1.8 10*3/uL (ref 0.7–4.0)
MCH: 31.6 pg (ref 26.0–34.0)
MCHC: 32.6 g/dL (ref 30.0–36.0)
MCV: 96.8 fL (ref 78.0–100.0)
MONO ABS: 0.6 10*3/uL (ref 0.1–1.0)
MONOS PCT: 9 % (ref 3–12)
MPV: 9.4 fL (ref 8.6–12.4)
NEUTROS ABS: 3.7 10*3/uL (ref 1.7–7.7)
Neutrophils Relative %: 59 % (ref 43–77)
PLATELETS: 239 10*3/uL (ref 150–400)
RBC: 4.4 MIL/uL (ref 3.87–5.11)
RDW: 13.9 % (ref 11.5–15.5)
WBC: 6.3 10*3/uL (ref 4.0–10.5)

## 2015-12-20 LAB — LIPID PANEL
CHOL/HDL RATIO: 3.5 ratio (ref <=5.0)
Cholesterol: 143 mg/dL (ref 125–200)
HDL: 41 mg/dL — AB (ref >=46)
LDL Cholesterol: 77 mg/dL (ref <130)
Triglycerides: 127 mg/dL (ref <150)
VLDL: 25 mg/dL (ref <30)

## 2015-12-20 LAB — HEPATIC FUNCTION PANEL
ALT: 14 U/L (ref 6–29)
AST: 16 U/L (ref 10–35)
Albumin: 3.9 g/dL (ref 3.6–5.1)
Alkaline Phosphatase: 65 U/L (ref 33–130)
BILIRUBIN DIRECT: 0.2 mg/dL (ref <=0.2)
BILIRUBIN INDIRECT: 0.9 mg/dL (ref 0.2–1.2)
BILIRUBIN TOTAL: 1.1 mg/dL (ref 0.2–1.2)
Total Protein: 6.3 g/dL (ref 6.1–8.1)

## 2015-12-20 LAB — BASIC METABOLIC PANEL WITH GFR
BUN: 26 mg/dL — ABNORMAL HIGH (ref 7–25)
CHLORIDE: 104 mmol/L (ref 98–110)
CO2: 31 mmol/L (ref 20–31)
CREATININE: 0.82 mg/dL (ref 0.60–0.88)
Calcium: 9.8 mg/dL (ref 8.6–10.4)
GFR, EST NON AFRICAN AMERICAN: 63 mL/min (ref >=60)
GFR, Est African American: 72 mL/min (ref >=60)
Glucose, Bld: 103 mg/dL — ABNORMAL HIGH (ref 65–99)
POTASSIUM: 3.4 mmol/L — AB (ref 3.5–5.3)
SODIUM: 147 mmol/L — AB (ref 135–146)

## 2015-12-20 LAB — HEMOGLOBIN A1C
Hgb A1c MFr Bld: 5.5 % (ref <5.7)
Mean Plasma Glucose: 111 mg/dL (ref <117)

## 2015-12-20 LAB — MAGNESIUM: MAGNESIUM: 1.9 mg/dL (ref 1.5–2.5)

## 2015-12-20 NOTE — Patient Instructions (Signed)

## 2015-12-20 NOTE — Progress Notes (Addendum)
Patient ID: [name], female   DOB: 1922-06-08, 82 y.o.   MRN: [id number]   Comprehensive Evaluation &  Examination  This very nice 82 y.o. [facility] presents for presents for a  comprehensive evaluation and management of multiple medical co-morbidities[facility] presents for presents for a  comprehensive evaluation and management of multiple medical co-morbidities.  Patient has been followed for HTN, Prediabetes, Hyperlipidemia and Vitamin D Deficiency. Today patient also is c/o a new appearance of a skin lesion of the tip of her nose appearing and increasing in size over the last 3-4 weeks.     HTN predates since 1989. Patient's BP has been controlled at home and patient denies any cardiac symptoms as chest pain, palpitations, shortness of breath, dizziness or ankle swelling. In 1994, she presented with CP and had a PTCA. In 2011, she had a TIA and had a negative head CT and MRI scans.   Today's BP: 128/82 mmHg    Patient's hyperlipidemia is controlled with diet. Last lipids were at goal with Cholesterol 146; HDL 35*; LDL 80; Triglycerides 157 on 06/18/2015.   Patient has prediabetes predating since Oct 2012 with A1c 6.0% and patient denies reactive hypoglycemic symptoms, visual blurring, diabetic polys, or paresthesias. Last A1c was 6.1% on 06/18/2015.   Finally, patient has history of Vitamin D Deficiency and last Vitamin D was 62 on 06/18/2015.   Medication Sig  . aspirin EC 81 MG Take 81 mg by mouth 2 (two) times daily.   . B-COMPLEX WITH VIT C Take 1 tablet by mouth daily.    . bumetanide  2 MG tablet TAKE 1 TABLET BY MOUTH  DAILY FOR BLOOD PRESSURE  AND FLUID  . Vit D  Take 9,000 Units by mouth daily.  . COSOPT  ophth soln Place 1 drop into both eyes 2 (two) times daily.  . IMDUR 30 MG  Take 1 tablet by mouth  every night at bedtime  . XALATAN 0.005 % ophth soln Place 1 drop into both eyes at bedtime.    Marland Kitchen loperamide 2 MG tablet Take 2 mg by mouth 4 (four) times daily as needed for diarrhea or loose stools.  Marland Kitchen losartan 100 MG tablet Take one-half tablet by  mouth twice a day for blood pressure  .  meloxicam  7.5 MG tablet Take 1 tablet (7.5 mg total) by mouth 2 (two) times daily.  . metoprolol-XL 100 MG 24 hr  Take 1 tablet (100 mg total) by mouth 2 (two) times daily.  Marland Kitchen FLORASTOR 250 MG  Take 1 capsule (250 mg total) by mouth 2 (two) times daily.  . traMADol 50 MG tablet TAKE 1 TABLET BY MOUTH TWICE DAILY AS NEEDED FOR PAIN  . Zinc 50 MG TABS Take 75 mg by mouth daily.   Allergies  Allergen Reactions  . Lactose Intolerance (Gi) Other (See Comments)    unknown   Past Medical History  Diagnosis Date  . Meningitis   . Macular degeneration   . Hypertension   . GERD (gastroesophageal reflux disease)   . Hyperlipidemia   . Elevated hemoglobin A1c   . Cataract   . Cancer [facility]) 1979 mastectomy  . Glaucoma     Right pupil (about 40mm) is larger than left pupil (about 44mm) due to surgery in 1973.  Amblyopia in right eye.   Health Maintenance  Topic Date Due  . FOOT EXAM  04/01/1934  . OPHTHALMOLOGY EXAM  04/01/1934  . ZOSTAVAX  04/01/1984  . DEXA SCAN  04/01/1989  . PNA vac Low Risk Adult (2 of 2 - PCV13) 11/27/2010  .  TETANUS/TDAP  11/27/2012  . HEMOGLOBIN A1C  12/19/2015  . INFLUENZA VACCINE  06/27/2016   Immunization History  Administered Date(s) Administered  . DT 01/21/2014  . Influenza Split 10/13/2013  . Influenza, High Dose Seasonal PF 08/17/2014  . Influenza-Unspecified 11/27/2010, 09/28/2015  . PPD Test 07/16/2015  . Pneumococcal-Unspecified 11/27/2000, 11/27/2009  . Td 11/27/2002   Past Surgical History  Procedure Laterality Date  . Abdominal hysterectomy    . Mastectomy    . Coronary stent placement    . Colon resection    . Eye surgery  1973    Dr. Katy Fitch  . Breast surgery Left 1979    mastectomy  . Skin cancer excision  2008     Squamous cell - left thigh   Family History  Problem Relation Age of Onset  . Hypertension Mother   . Hypertension Father   . CVA Father   . Arthritis Sister     Rheumatoid  . Heart attack Brother   . Heart disease  Brother   . Diabetes Daughter   . Hypertension Daughter    Social History  Substance Use Topics  . Smoking status: Never Smoker   . Smokeless tobacco: Never Used  . Alcohol Use: No    ROS Constitutional: Denies fever, chills, weight loss/gain, headaches, insomnia,  night sweats, and change in appetite. Does c/o fatigue. Eyes: Denies redness, blurred vision, diplopia, discharge, itchy, watery eyes.  ENT: Denies discharge, congestion, post nasal drip, epistaxis, sore throat, earache, hearing loss, dental pain, Tinnitus, Vertigo, Sinus pain, snoring.  Cardio: Denies chest pain, palpitations, irregular heartbeat, syncope, dyspnea, diaphoresis, orthopnea, PND, claudication, edema Respiratory: denies cough, dyspnea, DOE, pleurisy, hoarseness, laryngitis, wheezing.  Gastrointestinal: Denies dysphagia, heartburn, reflux, water brash, pain, cramps, nausea, vomiting, bloating, diarrhea, constipation, hematemesis, melena, hematochezia, jaundice, hemorrhoids Genitourinary: Denies dysuria, frequency, urgency, nocturia, hesitancy, discharge, hematuria, flank pain Breast: Breast lumps, nipple discharge, bleeding.  Musculoskeletal: Denies arthralgia, myalgia, stiffness, Jt. Swelling, pain, limp, and strain/sprain. Denies falls. Skin: Denies puritis, rash, hives, warts, acne, eczema, changing in skin lesion Neuro: No weakness, tremor, incoordination, spasms, paresthesia, pain Psychiatric: Denies confusion, memory loss, sensory loss. Denies Depression. Endocrine: Denies change in weight, skin, hair change, nocturia, and paresthesia, diabetic polys, visual blurring, hyper / hypo glycemic episodes.  Heme/Lymph: No excessive bleeding, bruising, enlarged lymph nodes.  Physical Exam  BP 128/82 mmHg  Pulse 84  Temp(Src) 97.7 F (36.5 C)  Resp 16  Ht 5' 2.5" (1.588 m)  Wt 133 lb 12.8 oz (60.691 kg)  BMI 24.07 kg/m2  General Appearance: Well nourished and in no apparent distress. Eyes: Anisocoria , R>>L  and R pupil is eccentric, EOMs, conjunctiva no swelling or erythema, normal fundi and vessels. Sinuses: No frontal/maxillary tenderness ENT/Mouth: EACs patent / TMs  nl. Nares clear without erythema, swelling, mucoid exudates. Oral hygiene is good. No erythema, swelling, or exudate. Tongue normal, non-obstructing. Tonsils not swollen or erythematous. Hearing normal.  Neck: Supple, thyroid normal. No bruits, nodes or JVD. Respiratory: Respiratory effort normal.  BS equal and clear bilateral without rales, rhonci, wheezing or stridor. Cardio: Heart sounds are normal with regular rate and rhythm and no murmurs, rubs or gallops. Peripheral pulses are normal and equal bilaterally without edema. No aortic or femoral bruits. Chest: symmetric with normal excursions and percussion. Breasts: L Breast surg absent . R breast atrophic w/o masses evident. Abdomen: Flat, soft, with bowl sounds. Nontender, no guarding, rebound, hernias, masses, or organomegaly.  Lymphatics: Non tender without lymphadenopathy.  Genitourinary:  Musculoskeletal: Full ROM all peripheral extremities, joint stability, 5/5 strength, and normal gait. Skin: Warm and dry without rashes, lesions, cyanosis, clubbing or  ecchymosis. There is a 3 mm exophytic pink fleshy lesion of the R tip of the nose.  Procedure: ( CPT - 17000) - after informed consent the lesion was treated with liquid Nitrogen by triple freeze-thaw technique. Patient was instructed in p/o care & f/u.    Neuro: Cranial nerves intact, reflexes equal bilaterally. Normal muscle tone, no cerebellar symptoms. Sensation intact.  Pysch: Alert and oriented X 3, normal affect, Insight and Judgment appropriate.   Assessment and Plan  1. Essential hypertension  - Microalbumin / creatinine urine ratio - EKG 12-Lead - [place], RETROPERITNL ABD,  LTD - TSH  2. Hyperlipidemia  - Lipid panel - TSH  3. PreDiabetes  - Hemoglobin A1c - Insulin, random  4. Vitamin D  deficiency  - VITAMIN D 25 Hydroxy  5. Gastroesophageal reflux disease   6. Screening for rectal cancer  - POC Hemoccult Bld/Stl   7. Depression screen   8. Medication management  - Urinalysis, Routine w reflex microscopic  - CBC with Differential/Platelet - BASIC METABOLIC PANEL WITH GFR - Hepatic function panel - Magnesium   9. Skin Lesion , nasal tip - ukn significance  - Obliterated by cryosurgery   Continue prudent diet as discussed, weight control, BP monitoring, regular exercise, and medications. Discussed med's effects and SE's. Screening labs and tests as requested with regular follow-up as recommended. Over 40 minutes of exam, counseling, chart review and high complex critical decision making was performed.

## 2015-12-21 LAB — MICROALBUMIN / CREATININE URINE RATIO
CREATININE, URINE: 17 mg/dL — AB (ref 20–320)
MICROALB UR: 0.3 mg/dL
MICROALB/CREAT RATIO: 18 ug/mg{creat} (ref <30)

## 2015-12-21 LAB — URINALYSIS, ROUTINE W REFLEX MICROSCOPIC
Bilirubin Urine: NEGATIVE
GLUCOSE, UA: NEGATIVE
Hgb urine dipstick: NEGATIVE
Ketones, ur: NEGATIVE
Leukocytes, UA: NEGATIVE
Nitrite: NEGATIVE
PROTEIN: NEGATIVE
Specific Gravity, Urine: 1.007 (ref 1.001–1.035)
pH: 6 (ref 5.0–8.0)

## 2015-12-21 LAB — TSH: TSH: 1.843 u[IU]/mL (ref 0.350–4.500)

## 2015-12-21 LAB — VITAMIN D 25 HYDROXY (VIT D DEFICIENCY, FRACTURES): VIT D 25 HYDROXY: 79 ng/mL (ref 30–100)

## 2015-12-21 LAB — INSULIN, RANDOM: Insulin: 1.9 u[IU]/mL — ABNORMAL LOW (ref 2.0–19.6)

## 2016-01-19 ENCOUNTER — Other Ambulatory Visit: Payer: Self-pay | Admitting: Internal Medicine

## 2016-01-20 ENCOUNTER — Ambulatory Visit (INDEPENDENT_AMBULATORY_CARE_PROVIDER_SITE_OTHER): Payer: Medicare Other | Admitting: Internal Medicine

## 2016-01-20 ENCOUNTER — Encounter: Payer: Self-pay | Admitting: Internal Medicine

## 2016-01-20 VITALS — BP 120/66 | HR 78 | Temp 98.0°F | Resp 16 | Ht 62.5 in | Wt 124.0 lb

## 2016-01-20 DIAGNOSIS — K529 Noninfective gastroenteritis and colitis, unspecified: Secondary | ICD-10-CM | POA: Diagnosis not present

## 2016-01-20 MED ORDER — CHOLESTYRAMINE 4 G PO PACK: 1.0000 | PACK | Freq: Two times a day (BID) | ORAL | Status: DC

## 2016-01-20 MED ORDER — CHOLESTYRAMINE 4 G PO PACK: 1.0000 | PACK | Freq: Two times a day (BID) | ORAL | Status: AC

## 2016-01-20 NOTE — Patient Instructions (Signed)
Please collect a stool sample.  Preferably loose or diarrhea type stool for testing of fecal fat and for pathogen testing.   Please discontinue immodium.  Please give all oral medications.  1 hour after please mix packet of cholestyramine with 8 oz fluid of the patients choice and have her drink the mixture twice daily.    Please call the office if blood in stool or black colored stools.    The office will call [name] about a GI referral in the next week.

## 2016-01-20 NOTE — Progress Notes (Signed)
   Subjective:    Patient ID: [name], female    DOB: Apr 17, 1922, 82 y.o.   MRN: [id number]  Diarrhea  Pertinent negatives include no abdominal pain or vomiting.  Patient presents to the office for evaluation of diarrhea which has been getting worse for the past 3 days.  She reports that she has a history of bowel resection and since that time she has been having intermittent chronic diarrhea.  She has been on different medications in the past for immodium and also likely bentyl and hyoscamine.  She has been having more than 4 bowel movements at day even with immodium on  Board.  She reports that she generally takes 2 immodium at a time.  She reports that she doesn't have a lot of stool when she does tend to go.  She does have some urge to go but not much comes out.     Review of Systems  Respiratory: Negative for choking, chest tightness, shortness of breath and wheezing.   Cardiovascular: Negative for chest pain and palpitations.  Gastrointestinal: Positive for diarrhea. Negative for nausea, vomiting, abdominal pain, constipation and blood in stool.  Genitourinary: Negative for dysuria, urgency, frequency, hematuria and difficulty urinating.  Musculoskeletal: Negative for back pain.  Skin: Negative for rash.  Neurological: Negative for dizziness, weakness and light-headedness.  Psychiatric/Behavioral: Negative for confusion.       Objective:   Physical Exam  Constitutional: She is oriented to person, place, and time. She appears well-developed and well-nourished. No distress.  HENT:  Head: Normocephalic.  Mouth/Throat: Oropharynx is clear and moist.  Eyes: Conjunctivae are normal. No scleral icterus.  Neck: Normal range of motion. Neck supple. No JVD present. No thyromegaly present.  Cardiovascular: Normal rate and regular rhythm.  Exam reveals no gallop and no friction rub.   No murmur heard. Pulmonary/Chest: Effort normal and breath sounds normal. No respiratory distress.  She has no wheezes. She has no rales. She exhibits no tenderness.  Abdominal: Soft. Bowel sounds are normal. She exhibits no distension and no mass. There is no tenderness. There is no rebound and no guarding.  Musculoskeletal: Normal range of motion.  Lymphadenopathy:    She has no cervical adenopathy.  Neurological: She is alert and oriented to person, place, and time.  Skin: Skin is warm and dry. She is not diaphoretic.  Nursing note and vitals reviewed.   Filed Vitals:   01/20/16 1058  BP: 120/66  Pulse: 78  Temp: 98 F (36.7 C)  Resp: 16   Wt Readings from Last 3 Encounters:  01/20/16 124 lb (56.246 kg)  12/20/15 133 lb 12.8 oz (60.691 kg)  10/25/15 135 lb (61.236 kg)        Assessment & Plan:    1. Chronic diarrhea -add on Ova and Parasite - Ambulatory referral to Gastroenterology - Gastrointestinal Pathogen Panel PCR - Fecal fat, qualtitative - cholestyramine (QUESTRAN) 4 g packet; Take 1 packet by mouth 2 (two) times daily.  Dispense: 60 each; Refill: 11 -stop immodium

## 2016-01-21 ENCOUNTER — Ambulatory Visit: Payer: Self-pay | Admitting: Internal Medicine

## 2016-01-21 ENCOUNTER — Other Ambulatory Visit: Payer: Self-pay | Admitting: Internal Medicine

## 2016-01-24 LAB — OVA AND PARASITE EXAMINATION: OP: NONE SEEN

## 2016-01-25 ENCOUNTER — Other Ambulatory Visit: Payer: Self-pay | Admitting: Internal Medicine

## 2016-01-25 LAB — FECAL FAT, QUALITATIVE: FECAL FAT QUALITATIVE: ABNORMAL — AB

## 2016-01-25 MED ORDER — PANCRELIPASE (LIP-PROT-AMYL) 6000-19000 UNITS PO CPEP: 6000.0000 [IU] | ORAL_CAPSULE | Freq: Three times a day (TID) | ORAL | Status: AC

## 2016-01-26 LAB — GASTROINTESTINAL PATHOGEN PANEL PCR

## 2016-03-17 ENCOUNTER — Other Ambulatory Visit: Payer: Self-pay | Admitting: Internal Medicine

## 2016-03-21 ENCOUNTER — Other Ambulatory Visit: Payer: Self-pay | Admitting: *Deleted

## 2016-03-21 MED ORDER — ATENOLOL 100 MG PO TABS: 100.0000 mg | ORAL_TABLET | Freq: Every day | ORAL | Status: AC

## 2016-03-23 ENCOUNTER — Ambulatory Visit: Payer: Self-pay | Admitting: Internal Medicine

## 2016-03-29 ENCOUNTER — Encounter: Payer: Self-pay | Admitting: Internal Medicine

## 2016-03-29 ENCOUNTER — Ambulatory Visit (INDEPENDENT_AMBULATORY_CARE_PROVIDER_SITE_OTHER): Payer: Medicare Other | Admitting: Internal Medicine

## 2016-03-29 VITALS — BP 126/88 | HR 86 | Temp 98.0°F | Resp 16 | Ht 62.5 in | Wt 137.0 lb

## 2016-03-29 DIAGNOSIS — W19XXXA Unspecified fall, initial encounter: Secondary | ICD-10-CM

## 2016-03-29 DIAGNOSIS — R7309 Other abnormal glucose: Secondary | ICD-10-CM

## 2016-03-29 DIAGNOSIS — S8011XA Contusion of right lower leg, initial encounter: Secondary | ICD-10-CM

## 2016-03-29 DIAGNOSIS — I10 Essential (primary) hypertension: Secondary | ICD-10-CM

## 2016-03-29 DIAGNOSIS — E559 Vitamin D deficiency, unspecified: Secondary | ICD-10-CM

## 2016-03-29 DIAGNOSIS — Z79899 Other long term (current) drug therapy: Secondary | ICD-10-CM | POA: Diagnosis not present

## 2016-03-29 DIAGNOSIS — E785 Hyperlipidemia, unspecified: Secondary | ICD-10-CM

## 2016-03-29 DIAGNOSIS — K8689 Other specified diseases of pancreas: Secondary | ICD-10-CM

## 2016-03-29 LAB — LIPID PANEL
CHOL/HDL RATIO: 2.7 ratio (ref <=5.0)
CHOLESTEROL: 103 mg/dL — AB (ref 125–200)
HDL: 38 mg/dL — AB (ref >=46)
LDL Cholesterol: 40 mg/dL (ref <130)
Triglycerides: 123 mg/dL (ref <150)
VLDL: 25 mg/dL (ref <30)

## 2016-03-29 LAB — BASIC METABOLIC PANEL WITH GFR
BUN: 14 mg/dL (ref 7–25)
CALCIUM: 9.2 mg/dL (ref 8.6–10.4)
CO2: 29 mmol/L (ref 20–31)
Chloride: 108 mmol/L (ref 98–110)
Creat: 0.82 mg/dL (ref 0.60–0.88)
GFR, EST AFRICAN AMERICAN: 72 mL/min (ref >=60)
GFR, EST NON AFRICAN AMERICAN: 63 mL/min (ref >=60)
Glucose, Bld: 124 mg/dL — ABNORMAL HIGH (ref 65–99)
Potassium: 3.1 mmol/L — ABNORMAL LOW (ref 3.5–5.3)
SODIUM: 145 mmol/L (ref 135–146)

## 2016-03-29 LAB — CBC WITH DIFFERENTIAL/PLATELET
Basophils Absolute: 0 cells/uL (ref 0–200)
Basophils Relative: 0 %
EOS PCT: 1 %
Eosinophils Absolute: 75 cells/uL (ref 15–500)
HCT: 39.3 % (ref 35.0–45.0)
HEMOGLOBIN: 12.7 g/dL (ref 11.7–15.5)
LYMPHS ABS: 1350 {cells}/uL (ref 850–3900)
Lymphocytes Relative: 18 %
MCH: 31.1 pg (ref 27.0–33.0)
MCHC: 32.3 g/dL (ref 32.0–36.0)
MCV: 96.3 fL (ref 80.0–100.0)
MPV: 9.1 fL (ref 7.5–12.5)
Monocytes Absolute: 525 cells/uL (ref 200–950)
Monocytes Relative: 7 %
NEUTROS PCT: 74 %
Neutro Abs: 5550 cells/uL (ref 1500–7800)
Platelets: 233 10*3/uL (ref 140–400)
RBC: 4.08 MIL/uL (ref 3.80–5.10)
RDW: 13.6 % (ref 11.0–15.0)
WBC: 7.5 10*3/uL (ref 3.8–10.8)

## 2016-03-29 LAB — TSH: TSH: 1.84 mIU/L

## 2016-03-29 LAB — HEPATIC FUNCTION PANEL
ALT: 23 U/L (ref 6–29)
AST: 20 U/L (ref 10–35)
Albumin: 3.5 g/dL — ABNORMAL LOW (ref 3.6–5.1)
Alkaline Phosphatase: 77 U/L (ref 33–130)
BILIRUBIN DIRECT: 0.2 mg/dL (ref <=0.2)
BILIRUBIN INDIRECT: 0.7 mg/dL (ref 0.2–1.2)
BILIRUBIN TOTAL: 0.9 mg/dL (ref 0.2–1.2)
Total Protein: 5.8 g/dL — ABNORMAL LOW (ref 6.1–8.1)

## 2016-03-29 LAB — HEMOGLOBIN A1C
Hgb A1c MFr Bld: 5.3 % (ref <5.7)
MEAN PLASMA GLUCOSE: 105 mg/dL

## 2016-03-29 NOTE — Progress Notes (Signed)
Assessment and Plan:  Hypertension:  -Continue medication,  -monitor blood pressure at home.  -Continue DASH diet.   -Reminder to go to the ER if any CP, SOB, nausea, dizziness, severe HA, changes vision/speech, left arm numbness and tingling, and jaw pain.  Cholesterol: -Continue diet and exercise.  -Check cholesterol.   Pre-diabetes: -Continue diet and exercise.  -Check A1C  Vitamin D Def: -check level -continue medications.  Fall -normal neuro examination -not on blood thinners -no bony tenderness to palpation -no facial or head CT imaging required at this time -tylenol 1000 mg TID prn for pain  Hematoma right leg -elevate above heart -warm compresses -tylenol prn for pain -given normal gait with walker no imaging needed at this time  Pancreatic insufficiency -source of diarrhea which has since resolved with treatment -cont pancreatic enzymes  Continue diet and meds as discussed. Further disposition pending results of labs.  HPI 82 y.o. female  presents for 3 month follow up with hypertension, hyperlipidemia, prediabetes and vitamin D.   Her blood pressure has been controlled at home, today their BP is BP: 126/88 mmHg.   She does not workout. She denies chest pain, shortness of breath, dizziness.   She is not on cholesterol medication and denies myalgias. Her cholesterol is at goal. The cholesterol last visit was:   Lab Results  Component Value Date   CHOL 143 12/20/2015   HDL 41* 12/20/2015   LDLCALC 77 12/20/2015   TRIG 127 12/20/2015   CHOLHDL 3.5 12/20/2015     She has been working on diet and exercise for prediabetes, and denies foot ulcerations, hyperglycemia, hypoglycemia , increased appetite, nausea, paresthesia of the feet, polydipsia, polyuria, visual disturbances, vomiting and weight loss. Last A1C in the office was:  Lab Results  Component Value Date   HGBA1C 5.5 12/20/2015    Patient is on Vitamin D supplement.  Lab Results  Component Value  Date   VD25OH 79 12/20/2015     Diarrhea has nearly completely resolved since starting pancreatic enzymes.  She does not need to take immodium anymore.  She reports that the powder tastes bad but it is better than diarrhea.  She is gaining weight again.    She did fall this morning putting her laundry away.  She did hit her nose and her right legs.  She did not lose consciousness.  She is not on blood thinners.  She did not having any nose bleeding.  She does have some bruising and swelling to her right leg.  She is able to walk normally with her walker.   Current Medications:  Current Outpatient Prescriptions on File Prior to Visit  Medication Sig Dispense Refill  . aspirin EC 81 MG tablet Take 81 mg by mouth 2 (two) times daily.     Marland Kitchen atenolol (TENORMIN) 100 MG tablet Take 1 tablet (100 mg total) by mouth daily. 90 tablet 1  . B Complex-C (B-COMPLEX WITH VITAMIN C) tablet Take 1 tablet by mouth daily.      . bumetanide (BUMEX) 2 MG tablet TAKE 1 TABLET BY MOUTH  DAILY FOR BLOOD PRESSURE  AND FLUID 90 tablet 99  . CHOLECALCIFEROL PO Take 9,000 Units by mouth daily.    . cholestyramine (QUESTRAN) 4 g packet Take 1 packet by mouth 2 (two) times daily. 60 each 11  . dorzolamide-timolol (COSOPT) 22.3-6.8 MG/ML ophthalmic solution Place 1 drop into both eyes 2 (two) times daily.  0  . DUREZOL 0.05 % EMUL     .  isosorbide mononitrate (IMDUR) 30 MG 24 hr tablet Take 1 tablet by mouth  every night at bedtime 15 tablet 0  . latanoprost (XALATAN) 0.005 % ophthalmic solution Place 1 drop into both eyes at bedtime.      Marland Kitchen losartan (COZAAR) 100 MG tablet Take one-half tablet by  mouth twice a day for blood pressure 90 tablet 99  . meloxicam (MOBIC) 7.5 MG tablet Take 1 tablet (7.5 mg total) by mouth 2 (two) times daily. 60 tablet 6  . Pancrelipase, Lip-Prot-Amyl, 6000 units CPEP Take 1 capsule (6,000 Units total) by mouth 3 (three) times daily before meals. 450 capsule 1  . saccharomyces boulardii  (FLORASTOR) 250 MG capsule Take 1 capsule (250 mg total) by mouth 2 (two) times daily.    . traMADol (ULTRAM) 50 MG tablet TAKE 1 TABLET BY MOUTH TWICE DAILY AS NEEDED FOR PAIN 60 tablet 2  . Zinc 50 MG TABS Take 75 mg by mouth daily.     No current facility-administered medications on file prior to visit.    Medical History:  Past Medical History  Diagnosis Date  . Meningitis   . Macular degeneration   . Hypertension   . GERD (gastroesophageal reflux disease)   . Hyperlipidemia   . Elevated hemoglobin A1c   . Cataract   . Cancer [facility]) 1979 mastectomy  . Glaucoma     Right pupil (about 53mm) is larger than left pupil (about 19mm) due to surgery in 1973.  Amblyopia in right eye.    Allergies:  Allergies  Allergen Reactions  . Lactose Intolerance (Gi) Other (See Comments)    unknown     Review of Systems:  Review of Systems  Constitutional: Negative for fever, chills and malaise/fatigue.  HENT: Negative for congestion, ear pain, hearing loss and sore throat.   Eyes: Negative.   Respiratory: Negative for cough, shortness of breath and wheezing.   Cardiovascular: Negative for chest pain, palpitations and leg swelling.  Gastrointestinal: Negative for heartburn, abdominal pain, diarrhea, constipation, blood in stool and melena.  Genitourinary: Negative.   Skin: Negative.   Neurological: Negative for dizziness, sensory change, loss of consciousness and headaches.  Psychiatric/Behavioral: Negative for depression. The patient is not nervous/anxious and does not have insomnia.     Family history- Review and unchanged  Social history- Review and unchanged  Physical Exam: BP 126/88 mmHg  Pulse 86  Temp(Src) 98 F (36.7 C) (Temporal)  Resp 16  Ht 5' 2.5" (1.588 m)  Wt 137 lb (62.143 kg)  BMI 24.64 kg/m2 Wt Readings from Last 3 Encounters:  03/29/16 137 lb (62.143 kg)  01/20/16 124 lb (56.246 kg)  12/20/15 133 lb 12.8 oz (60.691 kg)    General Appearance: Well nourished  well developed, in no apparent distress. Eyes: PERRLA, EOMs, conjunctiva no swelling or erythema.  Bruising and scrape to the bridge of the nose without crepitus or bony tenderness.  Septum of the nose is midline.   ENT/Mouth: Ear canals normal without obstruction, swelling, erythma, discharge.  TMs normal bilaterally.  Oropharynx moist, clear, without exudate, or postoropharyngeal swelling. Neck: Supple, thyroid normal,no cervical adenopathy  Respiratory: Respiratory effort normal, Breath sounds clear A&P without rhonchi, wheeze, or rale.  No retractions, no accessory usage. Cardio: RRR with no MRGs. Brisk peripheral pulses without edema.  Abdomen: Soft, + BS,  Non tender, no guarding, rebound, masses.  Hernia to the left ventral abdomen Musculoskeletal: Full ROM, 5/5 strength, Normal gait.  Hematoma to the right lateral anterior tibialis with  minimal tenderness to palpation.   Skin: Warm, dry without rashes, lesions, ecchymosis.  Neuro: Awake and oriented X 3, Cranial nerves intact. Normal muscle tone, no cerebellar symptoms.  Normal gait with walker.   Psych: Normal affect, Insight and Judgment appropriate.    Starlyn Skeans, PA-C 10:21 AM [facility]

## 2016-04-26 ENCOUNTER — Other Ambulatory Visit: Payer: Self-pay | Admitting: Internal Medicine

## 2016-04-27 ENCOUNTER — Ambulatory Visit: Payer: Medicare Other | Admitting: Internal Medicine

## 2016-04-27 ENCOUNTER — Ambulatory Visit: Payer: Self-pay | Admitting: Internal Medicine

## 2016-04-27 ENCOUNTER — Encounter: Payer: Self-pay | Admitting: Internal Medicine

## 2016-04-27 VITALS — BP 124/80 | HR 84 | Temp 97.7°F | Resp 16 | Ht 62.5 in | Wt 134.4 lb

## 2016-04-27 DIAGNOSIS — S8011XA Contusion of right lower leg, initial encounter: Secondary | ICD-10-CM

## 2016-04-27 NOTE — Progress Notes (Signed)
Subjective:    Patient ID: [name], female    DOB: 12-31-1921, 82 y.o.   MRN: [id number]  HPI  This very nice 82 yo WWF with declining physical & mental status was recently been incarcerated at the [facility] is brought in today by her g-daughter and POA [name] to evaluate a "lump" of her Rt leg following a fall about 3 weeks ago. Patient has been ambulating w/o difficulty   Medication Sig  . aspirin EC 81 MG tablet Take 81 mg by mouth 2 (two) times daily.   Marland Kitchen atenolol (TENORMIN) 100 MG tablet Take 1 tablet (100 mg total) by mouth daily.  . B Complex-C (B-COMPLEX WITH VITAMIN C) tablet Take 1 tablet by mouth daily.    . bumetanide (BUMEX) 2 MG tablet TAKE 1 TABLET BY MOUTH  DAILY FOR BLOOD PRESSURE  AND FLUID  . CHOLECALCIFEROL PO Take 9,000 Units by mouth daily.  . cholestyramine (QUESTRAN) 4 g packet Take 1 packet by mouth 2 (two) times daily.  . dorzolamide-timolol (COSOPT) 22.3-6.8 MG/ML ophthalmic solution Place 1 drop into both eyes 2 (two) times daily.  . DUREZOL 0.05 % EMUL   . isosorbide mononitrate (IMDUR) 30 MG 24 hr tablet Take 1 tablet by mouth  every night at bedtime  . latanoprost (XALATAN) 0.005 % ophthalmic solution Place 1 drop into both eyes at bedtime.    Marland Kitchen losartan (COZAAR) 100 MG tablet Take one-half tablet by  mouth twice a day for blood pressure  . meloxicam (MOBIC) 7.5 MG tablet Take 1 tablet (7.5 mg total) by mouth 2 (two) times daily.  . Pancrelipase, Lip-Prot-Amyl, 6000 units CPEP Take 1 capsule (6,000 Units total) by mouth 3 (three) times daily before meals.  . saccharomyces boulardii (FLORASTOR) 250 MG capsule Take 1 capsule (250 mg total) by mouth 2 (two) times daily.  . traMADol (ULTRAM) 50 MG tablet TAKE 1 TABLET BY MOUTH TWICE DAILY AS NEEDED FOR PAIN  . Zinc 50 MG TABS Take 75 mg by mouth daily.   Allergies  Allergen Reactions  . Lactose Intolerance (Gi) Other (See Comments)    unknown   Past Medical History  Diagnosis Date  .  Meningitis   . Macular degeneration   . Hypertension   . GERD (gastroesophageal reflux disease)   . Hyperlipidemia   . Elevated hemoglobin A1c   . Cataract   . Cancer [facility]) 1979 mastectomy  . Glaucoma     Right pupil (about 12mm) is larger than left pupil (about 37mm) due to surgery in 1973.  Amblyopia in right eye.   Past Surgical History  Procedure Laterality Date  . Abdominal hysterectomy    . Mastectomy    . Coronary stent placement    . Colon resection    . Eye surgery  1973    Dr. Katy Fitch  . Breast surgery Left 1979    mastectomy  . Skin cancer excision  2008     Squamous cell - left thigh   Review of Systems 10 point systems review negative except as above.    Objective:   Physical Exam  In no distress  HEENT - Eac's patent. TM's Nl. EOM's full. Anisocoria - L>R.  NasoOroPharynx clear. Neck - supple. Nl Thyroid. Carotids 2+ & No bruits, nodes, JVD Chest - Clear equal BS w/o Rales, rhonchi, wheezes. Cor - Nl HS. RRR w/o sig MGR. PP 1(+). No edema. Abd - No palpable organomegaly, masses or tenderness. BS nl. MS- FROM w/o deformities.  Muscle power, tone and bulk Nl. Gait Nl. Neuro - No obvious Cr N abnormalities. Sensory, motor and Cerebellar functions appear Nl w/o focal abnormalities. Psyche - Mental status normal & appropriate.  No delusions, ideations or obvious mood abnormalities. Skin - non-tender hematoma with minimal ecchymoses along proximal lateral R shin w/o signs of cellulitis or lymphangitis.     Assessment & Plan:   1. Hematoma of leg, right, initial encounter  - g-daughter reassured no current signs of infection and to return if signs/sx's change.   (No charge OV today)

## 2016-05-16 ENCOUNTER — Inpatient Hospital Stay (HOSPITAL_COMMUNITY)
Admission: EM | Admit: 2016-05-16 | Discharge: 2016-05-22 | DRG: 378 | Disposition: A | Discharge status: Home Health | Payer: Medicare Other | Attending: Internal Medicine | Admitting: Internal Medicine

## 2016-05-16 ENCOUNTER — Encounter (HOSPITAL_COMMUNITY): Payer: Self-pay | Admitting: Emergency Medicine

## 2016-05-16 DIAGNOSIS — Z8249 Family history of ischemic heart disease and other diseases of the circulatory system: Secondary | ICD-10-CM | POA: Diagnosis not present

## 2016-05-16 DIAGNOSIS — Z833 Family history of diabetes mellitus: Secondary | ICD-10-CM | POA: Diagnosis not present

## 2016-05-16 DIAGNOSIS — Z66 Do not resuscitate: Secondary | ICD-10-CM | POA: Diagnosis present

## 2016-05-16 DIAGNOSIS — K922 Gastrointestinal hemorrhage, unspecified: Secondary | ICD-10-CM | POA: Diagnosis present

## 2016-05-16 DIAGNOSIS — Z79899 Other long term (current) drug therapy: Secondary | ICD-10-CM

## 2016-05-16 DIAGNOSIS — I480 Paroxysmal atrial fibrillation: Secondary | ICD-10-CM | POA: Diagnosis present

## 2016-05-16 DIAGNOSIS — Z823 Family history of stroke: Secondary | ICD-10-CM | POA: Diagnosis not present

## 2016-05-16 DIAGNOSIS — Z85828 Personal history of other malignant neoplasm of skin: Secondary | ICD-10-CM

## 2016-05-16 DIAGNOSIS — N19 Unspecified kidney failure: Secondary | ICD-10-CM

## 2016-05-16 DIAGNOSIS — F05 Delirium due to known physiological condition: Secondary | ICD-10-CM | POA: Diagnosis present

## 2016-05-16 DIAGNOSIS — Z9010 Acquired absence of unspecified breast and nipple: Secondary | ICD-10-CM | POA: Diagnosis not present

## 2016-05-16 DIAGNOSIS — K644 Residual hemorrhoidal skin tags: Secondary | ICD-10-CM | POA: Diagnosis present

## 2016-05-16 DIAGNOSIS — Z8661 Personal history of infections of the central nervous system: Secondary | ICD-10-CM

## 2016-05-16 DIAGNOSIS — Z7982 Long term (current) use of aspirin: Secondary | ICD-10-CM

## 2016-05-16 DIAGNOSIS — Z955 Presence of coronary angioplasty implant and graft: Secondary | ICD-10-CM | POA: Diagnosis not present

## 2016-05-16 DIAGNOSIS — Z9012 Acquired absence of left breast and nipple: Secondary | ICD-10-CM | POA: Diagnosis not present

## 2016-05-16 DIAGNOSIS — H3530 Unspecified macular degeneration: Secondary | ICD-10-CM | POA: Diagnosis present

## 2016-05-16 DIAGNOSIS — K5731 Diverticulosis of large intestine without perforation or abscess with bleeding: Secondary | ICD-10-CM | POA: Diagnosis present

## 2016-05-16 DIAGNOSIS — D123 Benign neoplasm of transverse colon: Secondary | ICD-10-CM | POA: Diagnosis present

## 2016-05-16 DIAGNOSIS — Z853 Personal history of malignant neoplasm of breast: Secondary | ICD-10-CM | POA: Diagnosis not present

## 2016-05-16 DIAGNOSIS — Z515 Encounter for palliative care: Secondary | ICD-10-CM | POA: Diagnosis not present

## 2016-05-16 DIAGNOSIS — I4892 Unspecified atrial flutter: Secondary | ICD-10-CM | POA: Diagnosis present

## 2016-05-16 DIAGNOSIS — D649 Anemia, unspecified: Secondary | ICD-10-CM | POA: Diagnosis not present

## 2016-05-16 DIAGNOSIS — I10 Essential (primary) hypertension: Secondary | ICD-10-CM | POA: Diagnosis present

## 2016-05-16 DIAGNOSIS — E785 Hyperlipidemia, unspecified: Secondary | ICD-10-CM | POA: Diagnosis present

## 2016-05-16 DIAGNOSIS — Z9181 History of falling: Secondary | ICD-10-CM | POA: Diagnosis not present

## 2016-05-16 DIAGNOSIS — Z91011 Allergy to milk products: Secondary | ICD-10-CM

## 2016-05-16 DIAGNOSIS — K219 Gastro-esophageal reflux disease without esophagitis: Secondary | ICD-10-CM | POA: Diagnosis present

## 2016-05-16 DIAGNOSIS — Z7189 Other specified counseling: Secondary | ICD-10-CM | POA: Diagnosis not present

## 2016-05-16 DIAGNOSIS — D62 Acute posthemorrhagic anemia: Secondary | ICD-10-CM | POA: Diagnosis present

## 2016-05-16 DIAGNOSIS — D126 Benign neoplasm of colon, unspecified: Secondary | ICD-10-CM | POA: Diagnosis not present

## 2016-05-16 DIAGNOSIS — H409 Unspecified glaucoma: Secondary | ICD-10-CM | POA: Diagnosis present

## 2016-05-16 DIAGNOSIS — Z9049 Acquired absence of other specified parts of digestive tract: Secondary | ICD-10-CM

## 2016-05-16 DIAGNOSIS — K921 Melena: Secondary | ICD-10-CM | POA: Diagnosis not present

## 2016-05-16 LAB — ABO/RH: ABO/RH(D): O POS

## 2016-05-16 LAB — CBC
HEMATOCRIT: 32.2 % — AB (ref 36.0–46.0)
HEMOGLOBIN: 10.7 g/dL — AB (ref 12.0–15.0)
MCH: 30.9 pg (ref 26.0–34.0)
MCHC: 33.2 g/dL (ref 30.0–36.0)
MCV: 93.1 fL (ref 78.0–100.0)
Platelets: 212 10*3/uL (ref 150–400)
RBC: 3.46 MIL/uL — ABNORMAL LOW (ref 3.87–5.11)
RDW: 14.2 % (ref 11.5–15.5)
WBC: 5.7 10*3/uL (ref 4.0–10.5)

## 2016-05-16 LAB — COMPREHENSIVE METABOLIC PANEL
ALBUMIN: 3.2 g/dL — AB (ref 3.5–5.0)
ALK PHOS: 61 U/L (ref 38–126)
ALT: 17 U/L (ref 14–54)
ANION GAP: 6 (ref 5–15)
AST: 18 U/L (ref 15–41)
BILIRUBIN TOTAL: 0.9 mg/dL (ref 0.3–1.2)
BUN: 24 mg/dL — AB (ref 6–20)
CALCIUM: 8.4 mg/dL — AB (ref 8.9–10.3)
CO2: 27 mmol/L (ref 22–32)
Chloride: 112 mmol/L — ABNORMAL HIGH (ref 101–111)
Creatinine, Ser: 0.84 mg/dL (ref 0.44–1.00)
GFR calc Af Amer: >60 mL/min (ref >60)
GFR, EST NON AFRICAN AMERICAN: 59 mL/min — AB (ref >60)
GLUCOSE: 128 mg/dL — AB (ref 65–99)
POTASSIUM: 3 mmol/L — AB (ref 3.5–5.1)
Sodium: 145 mmol/L (ref 135–145)
TOTAL PROTEIN: 5.5 g/dL — AB (ref 6.5–8.1)

## 2016-05-16 LAB — PROTIME-INR
INR: 1.15 (ref 0.00–1.49)
Prothrombin Time: 14.9 seconds (ref 11.6–15.2)

## 2016-05-16 LAB — PREPARE RBC (CROSSMATCH)

## 2016-05-16 LAB — MRSA PCR SCREENING: MRSA BY PCR: NEGATIVE

## 2016-05-16 MED ORDER — SODIUM CHLORIDE 0.9 % IV SOLN
80.0000 mg | Freq: Once | INTRAVENOUS | Status: AC
  Administered 2016-05-16: 80 mg via INTRAVENOUS
  Filled 2016-05-16: qty 80

## 2016-05-16 MED ORDER — SODIUM CHLORIDE 0.9 % IV SOLN
Freq: Once | INTRAVENOUS | Status: AC
  Administered 2016-05-16: 22:00:00 via INTRAVENOUS

## 2016-05-16 MED ORDER — ENSURE ENLIVE PO LIQD: 237.0000 mL | Freq: Two times a day (BID) | ORAL | Status: DC

## 2016-05-16 NOTE — H&P (Addendum)
History and Physical    ARIADNA Wang [id number] DOB: [birth year] DOA: 05/16/2016  Referring MD/NP/PA: none PCP: [name], MD  Outpatient Specialists:Ophthalmology: Katy Fitch; Dentist: [facility] Patient coming from: home  Chief Complaint: GI bleeding  HPI: [name] is a 82 y.o. female with medical history significant of breast cancer, HTN, and macular degeneration presenting with acute onset of bright red blood per rectum. Patient reports having a nice lunch at [facility].  She went up to her room and sat down and felt the need to have BM.  Noted a small amount of bright red blood in it.  Reported this to the nurse and then had another BM about 30 minutes later.  She called the nurse to see the commode and they felt the need to send her to the ER.  Has continued about q30 minutes since.  +fecal incontinence.  Has never had this happen before.  No pain in abdomen throughout the day.  Does feel weak/tired but denies feeling ill, denies light-headedness/dizziness.  Had partial colectomy in April 2010 due to colonic wall edema (? - unable to pass even pediatric scope).    ED Course: IV Protonix and 2 units PRBC ordered for transfusion  Review of Systems: As per HPI otherwise 10 point review of systems reviewed and negative.   Past Medical History  Diagnosis Date  . Meningitis   . Macular degeneration   . Hypertension   . GERD (gastroesophageal reflux disease)   . Hyperlipidemia   . Elevated hemoglobin A1c   . Cataract   . Cancer [facility]) 1979 mastectomy  . Glaucoma     Right pupil (about 22mm) is larger than left pupil (about 21mm) due to surgery in 1973.  Amblyopia in right eye.    Past Surgical History  Procedure Laterality Date  . Abdominal hysterectomy    . Mastectomy    . Coronary stent placement    . Colon resection    . Eye surgery  1973    Dr. Katy Fitch  . Breast surgery Left 1979    mastectomy  . Skin cancer excision  2008     Squamous cell - left  thigh     reports that she has never smoked. She has never used smokeless tobacco. She reports that she does not drink alcohol or use illicit drugs.  Allergies  Allergen Reactions  . Lactose Intolerance (Gi) Other (See Comments)    unknown    Family History  Problem Relation Age of Onset  . Hypertension Mother   . Hypertension Father   . CVA Father   . Arthritis Sister     Rheumatoid  . Heart attack Brother   . Heart disease Brother   . Diabetes Daughter   . Hypertension Daughter      Prior to Admission medications   Medication Sig Start Date End Date Taking? Authorizing Provider  acetaminophen (TYLENOL) 500 MG tablet Take 1,000 mg by mouth every 8 (eight) hours as needed for moderate pain.   Yes Historical Provider, MD  aspirin EC 81 MG tablet Take 81 mg by mouth 2 (two) times daily.    Yes Historical Provider, MD  atenolol (TENORMIN) 100 MG tablet Take 1 tablet (100 mg total) by mouth daily. 03/21/16  Yes Unk Pinto, MD  B Complex-C (B-COMPLEX WITH VITAMIN C) tablet Take 1 tablet by mouth daily.     Yes Historical Provider, MD  bumetanide (BUMEX) 2 MG tablet TAKE 1 TABLET BY MOUTH  DAILY FOR BLOOD  PRESSURE  AND FLUID 05/29/15  Yes Unk Pinto, MD  Cholecalciferol (VITAMIN D3) 2000 units TABS Take 8,000 Units by mouth daily.   Yes Historical Provider, MD  cholestyramine (QUESTRAN) 4 g packet Take 1 packet by mouth 2 (two) times daily. 01/20/16 01/19/17 Yes Courtney Forcucci, PA-C  dorzolamide-timolol (COSOPT) 22.3-6.8 MG/ML ophthalmic solution Place 1 drop into the left eye 2 (two) times daily.  12/03/14  Yes Historical Provider, MD  DUREZOL 0.05 % EMUL Place 1 drop into the right eye 2 (two) times daily.  12/16/15  Yes Historical Provider, MD  isosorbide mononitrate (IMDUR) 30 MG 24 hr tablet Take 1 tablet by mouth  every night at bedtime 06/01/15  Yes Unk Pinto, MD  latanoprost (XALATAN) 0.005 % ophthalmic solution Place 1 drop into the left eye at bedtime.    Yes  Historical Provider, MD  losartan (COZAAR) 100 MG tablet Take one-half tablet by  mouth twice a day for blood pressure 05/29/15  Yes Unk Pinto, MD  meloxicam (MOBIC) 15 MG tablet Take 15 mg by mouth daily as needed for pain.   Yes Historical Provider, MD  meloxicam (MOBIC) 7.5 MG tablet Take 1 tablet (7.5 mg total) by mouth 2 (two) times daily. 10/25/15 10/24/16 Yes Vicie Mutters, PA-C  Pancrelipase, Lip-Prot-Amyl, 6000 units CPEP Take 1 capsule (6,000 Units total) by mouth 3 (three) times daily before meals. 01/25/16  Yes Courtney Forcucci, PA-C  saccharomyces boulardii (FLORASTOR) 250 MG capsule Take 1 capsule (250 mg total) by mouth 2 (two) times daily. 12/30/14  Yes Verlee Monte, MD  Zinc 50 MG TABS Take 50 mg by mouth daily.    Yes Historical Provider, MD    Physical Exam: Filed Vitals:   05/16/16 2200 05/16/16 2235 05/16/16 2300 05/17/16 0050  BP: 118/74 96/56 97/50  113/78  Pulse: 115 112  105  Temp: 98 F (36.7 C) 98 F (36.7 C)  97.8 F (36.6 C)  TempSrc: Oral Oral  Oral  Resp: 17 19 22 27   Height: 5\' 3"  (1.6 m)     Weight: 58.8 kg (129 lb 10.1 oz)     SpO2: 97% 95% 99% 99%      Constitutional: NAD, calm, comfortable Filed Vitals:   05/16/16 2200 05/16/16 2235 05/16/16 2300 05/17/16 0050  BP: 118/74 96/56 97/50  113/78  Pulse: 115 112  105  Temp: 98 F (36.7 C) 98 F (36.7 C)  97.8 F (36.6 C)  TempSrc: Oral Oral  Oral  Resp: 17 19 22 27   Height: 5\' 3"  (1.6 m)     Weight: 58.8 kg (129 lb 10.1 oz)     SpO2: 97% 95% 99% 99%   Gen: very conversant and in NAD Eyes: PERRL, lids and conjunctivae normal ENMT: Mucous membranes are moist. Posterior pharynx clear of any exudate or lesions.Normal dentition.  Neck: normal, supple, no masses, no thyromegaly Respiratory: clear to auscultation bilaterally, no wheezing, no crackles. Normal respiratory effort. No accessory muscle use.  Cardiovascular: Irregularly irregular rate and rhythm, no murmurs / rubs / gallops. 2+ pedal  pulses. No carotid bruits.  Abdomen: no tenderness, no masses palpated. No hepatosplenomegaly. Bowel sounds positive.  Musculoskeletal: no clubbing / cyanosis. No joint deformity upper and lower extremities. Good ROM, no contractures. Normal muscle tone. Some mild edema with evidence of venous stasis. Skin: no rashes, lesions, ulcers. No induration Neurologic: CN 2-12 grossly intact. Sensation intact, DTR normal. Strength 5/5 in all 4.  Psychiatric: Normal judgment and insight. Alert and oriented x 3. Normal mood.  Labs on Admission: I have personally reviewed following labs and imaging studies  CBC:  Recent Labs Lab 05/16/16 1900  WBC 5.7  HGB 10.7*  HCT 32.2*  MCV 93.1  PLT 99991111   Basic Metabolic Panel:  Recent Labs Lab 05/16/16 1900  NA 145  K 3.0*  CL 112*  CO2 27  GLUCOSE 128*  BUN 24*  CREATININE 0.84  CALCIUM 8.4*   GFR: Estimated Creatinine Clearance: 35.3 mL/min (by C-G formula based on Cr of 0.84). Liver Function Tests:  Recent Labs Lab 05/16/16 1900  AST 18  ALT 17  ALKPHOS 61  BILITOT 0.9  PROT 5.5*  ALBUMIN 3.2*   No results for input(s): LIPASE, AMYLASE in the last 168 hours. No results for input(s): AMMONIA in the last 168 hours. Coagulation Profile:  Recent Labs Lab 05/16/16 1856  INR 1.15   Cardiac Enzymes: No results for input(s): CKTOTAL, CKMB, CKMBINDEX, TROPONINI in the last 168 hours. BNP (last 3 results) No results for input(s): PROBNP in the last 8760 hours. HbA1C: No results for input(s): HGBA1C in the last 72 hours. CBG: No results for input(s): GLUCAP in the last 168 hours. Lipid Profile: No results for input(s): CHOL, HDL, LDLCALC, TRIG, CHOLHDL, LDLDIRECT in the last 72 hours. Thyroid Function Tests: No results for input(s): TSH, T4TOTAL, FREET4, T3FREE, THYROIDAB in the last 72 hours. Anemia Panel: No results for input(s): VITAMINB12, FOLATE, FERRITIN, TIBC, IRON, RETICCTPCT in the last 72 hours. Urine analysis:      Component Value Date/Time   COLORURINE YELLOW 12/20/2015 1103   APPEARANCEUR CLEAR 12/20/2015 1103   LABSPEC 1.007 12/20/2015 1103   PHURINE 6.0 12/20/2015 1103   GLUCOSEU NEGATIVE 12/20/2015 1103   HGBUR NEGATIVE 12/20/2015 1103   BILIRUBINUR NEGATIVE 12/20/2015 1103   KETONESUR NEGATIVE 12/20/2015 1103   PROTEINUR NEGATIVE 12/20/2015 1103   UROBILINOGEN 0.2 12/27/2014 1020   NITRITE NEGATIVE 12/20/2015 1103   LEUKOCYTESUR NEGATIVE 12/20/2015 1103   Sepsis Labs: @LABRCNTIP (procalcitonin:4,lacticidven:4) ) Recent Results (from the past 240 hour(s))  MRSA PCR Screening     Status: None   Collection Time: 05/16/16  9:43 PM  Result Value Ref Range Status   MRSA by PCR NEGATIVE NEGATIVE Final    Comment:        The GeneXpert MRSA Assay (FDA approved for NASAL specimens only), is one component of a comprehensive MRSA colonization surveillance program. It is not intended to diagnose MRSA infection nor to guide or monitor treatment for MRSA infections.      Radiological Exams on Admission: No results found.  EKG: None  Assessment/Plan Principal Problem:   Lower GI bleed Active Problems:   GERD (gastroesophageal reflux disease)   Glaucoma   Atrial flutter, unspecified  1. Lower GI bleed - most likely AVM or diverticular bleed based on acute onset of large volume painless melena.  Because of the frequency of stools and significant blood loss, will admit to SDU for ongoing monitoring.  Hemoglobin was relatively normal at the time of the admission but the patient was transfused with 2u PRBC due to the brisk and persistent bleeding noted.  Will continue to follow q6h CBC.  GI was notified by ER physician and they will see patient in the AM.  She was ordered Protonix by the ER physician but this is likely a lower bleed and so a drip is not indicated and with the critical shortage of Protonix, will change to IV Pepcid.  Will provide GoLytely bowel prep overnight in anticipation of  colonoscopy tomorrow.  Of note, on Mobic and this is likely unrelated to current problem but I have discontinued this medication. 2. GERD - IV Pepcid as above. 3. Glaucoma - Continue home meds. 4. Atrial flutter - Noted during my evaluation.  She does have mild tachycardia but this is likely related to acute GI bleed and blood loss anemia rather than to RVR.  Will monitor closely during and after transfusion in the SDU and continue home meds for rate control for now (with caution due to brisk bleeding and risk for hypotension).  DVT prophylaxis: SCDs Code Status: DNR, verified with patient Family Communication: None; granddaughter is POA and had already left when I saw patient.  She is [name] and can be reached at [phone]. Disposition Plan: Back to [facility] called: [facility] Admission status: admission to SDU   Karmen Bongo MD [facility] Pager [phone]  If 7PM-7AM, please contact night-coverage [email or web address] Password [facility]  05/17/2016, 2:36 AM

## 2016-05-16 NOTE — ED Notes (Signed)
Report given to 2W, RN.  

## 2016-05-16 NOTE — ED Provider Notes (Signed)
CSN: [id number]     Arrival date & time 05/16/16  1749 History   First MD Initiated Contact with Patient 05/16/16 1951     Chief Complaint  Patient presents with  . Rectal Bleeding     (Consider location/radiation/quality/duration/timing/severity/associated sxs/prior Treatment) Patient is a 82 y.o. female presenting with hematochezia.  Rectal Bleeding Quality:  Bright red and maroon Amount:  Copious Duration:  6 hours Timing:  Constant Chronicity:  New Context: not rectal injury and not rectal pain   Similar prior episodes: no   Relieved by:  None tried Worsened by:  Nothing tried Ineffective treatments:  None tried Associated symptoms: dizziness   Associated symptoms: no abdominal pain and no fever     Past Medical History  Diagnosis Date  . Meningitis   . Macular degeneration   . Hypertension   . GERD (gastroesophageal reflux disease)   . Hyperlipidemia   . Elevated hemoglobin A1c   . Cataract   . Cancer [facility]) 1979 mastectomy  . Glaucoma     Right pupil (about 29mm) is larger than left pupil (about 32mm) due to surgery in 1973.  Amblyopia in right eye.   Past Surgical History  Procedure Laterality Date  . Abdominal hysterectomy    . Mastectomy    . Coronary stent placement    . Colon resection    . Eye surgery  1973    Dr. Katy Fitch  . Breast surgery Left 1979    mastectomy  . Skin cancer excision  2008     Squamous cell - left thigh   Family History  Problem Relation Age of Onset  . Hypertension Mother   . Hypertension Father   . CVA Father   . Arthritis Sister     Rheumatoid  . Heart attack Brother   . Heart disease Brother   . Diabetes Daughter   . Hypertension Daughter    Social History  Substance Use Topics  . Smoking status: Never Smoker   . Smokeless tobacco: Never Used  . Alcohol Use: No   OB History    No data available     Review of Systems  Constitutional: Negative for fever.  HENT: Negative for congestion and facial swelling.    Eyes: Negative for discharge and redness.  Respiratory: Negative for cough and shortness of breath.   Cardiovascular: Negative for chest pain.  Gastrointestinal: Positive for hematochezia. Negative for abdominal pain and abdominal distention.  Endocrine: Negative for polydipsia.  Genitourinary: Negative for dysuria.  Musculoskeletal: Negative for back pain.  Skin: Negative for wound.  Neurological: Positive for dizziness. Negative for headaches.  All other systems reviewed and are negative.     Allergies  Lactose intolerance (gi)  Home Medications   Prior to Admission medications   Medication Sig Start Date End Date Taking? Authorizing Provider  acetaminophen (TYLENOL) 500 MG tablet Take 1,000 mg by mouth every 8 (eight) hours as needed for moderate pain.   Yes Historical Provider, MD  aspirin EC 81 MG tablet Take 81 mg by mouth 2 (two) times daily.    Yes Historical Provider, MD  atenolol (TENORMIN) 100 MG tablet Take 1 tablet (100 mg total) by mouth daily. 03/21/16  Yes Unk Pinto, MD  B Complex-C (B-COMPLEX WITH VITAMIN C) tablet Take 1 tablet by mouth daily.     Yes Historical Provider, MD  bumetanide (BUMEX) 2 MG tablet TAKE 1 TABLET BY MOUTH  DAILY FOR BLOOD PRESSURE  AND FLUID 05/29/15  Yes Unk Pinto, MD  Cholecalciferol (VITAMIN D3) 2000 units TABS Take 8,000 Units by mouth daily.   Yes Historical Provider, MD  cholestyramine (QUESTRAN) 4 g packet Take 1 packet by mouth 2 (two) times daily. 01/20/16 01/19/17 Yes Courtney Forcucci, PA-C  dorzolamide-timolol (COSOPT) 22.3-6.8 MG/ML ophthalmic solution Place 1 drop into the left eye 2 (two) times daily.  12/03/14  Yes Historical Provider, MD  DUREZOL 0.05 % EMUL Place 1 drop into the right eye 2 (two) times daily.  12/16/15  Yes Historical Provider, MD  isosorbide mononitrate (IMDUR) 30 MG 24 hr tablet Take 1 tablet by mouth  every night at bedtime 06/01/15  Yes Unk Pinto, MD  latanoprost (XALATAN) 0.005 % ophthalmic  solution Place 1 drop into the left eye at bedtime.    Yes Historical Provider, MD  losartan (COZAAR) 100 MG tablet Take one-half tablet by  mouth twice a day for blood pressure 05/29/15  Yes Unk Pinto, MD  meloxicam (MOBIC) 7.5 MG tablet Take 1 tablet (7.5 mg total) by mouth 2 (two) times daily. 10/25/15 10/24/16 Yes Vicie Mutters, PA-C  Pancrelipase, Lip-Prot-Amyl, 6000 units CPEP Take 1 capsule (6,000 Units total) by mouth 3 (three) times daily before meals. 01/25/16  Yes Courtney Forcucci, PA-C  saccharomyces boulardii (FLORASTOR) 250 MG capsule Take 1 capsule (250 mg total) by mouth 2 (two) times daily. 12/30/14  Yes Verlee Monte, MD  Zinc 50 MG TABS Take 50 mg by mouth daily.    Yes Historical Provider, MD   BP 113/78 mmHg  Pulse 105  Temp(Src) 97.8 F (36.6 C) (Oral)  Resp 27  Ht 5\' 3"  (1.6 m)  Wt 129 lb 10.1 oz (58.8 kg)  BMI 22.97 kg/m2  SpO2 99% Physical Exam  Constitutional: She appears well-developed and well-nourished.  HENT:  Head: Normocephalic and atraumatic.  Neck: Normal range of motion.  Cardiovascular: Regular rhythm.  Tachycardia present.   Pulmonary/Chest: No stridor. No respiratory distress.  Abdominal: She exhibits no distension.  Genitourinary:  Gross hematochezia without obvious active source.  Neurological: She is alert.  Nursing note and vitals reviewed.   ED Course  Procedures (including critical care time) Labs Review Labs Reviewed  COMPREHENSIVE METABOLIC PANEL - Abnormal; Notable for the following:    Potassium 3.0 (*)    Chloride 112 (*)    Glucose, Bld 128 (*)    BUN 24 (*)    Calcium 8.4 (*)    Total Protein 5.5 (*)    Albumin 3.2 (*)    GFR calc non Af Amer 59 (*)    All other components within normal limits  CBC - Abnormal; Notable for the following:    RBC 3.46 (*)    Hemoglobin 10.7 (*)    HCT 32.2 (*)    All other components within normal limits  MRSA PCR SCREENING  PROTIME-INR  CBC  CBC  CBC  CBC  BASIC METABOLIC PANEL   POC OCCULT BLOOD, ED  TYPE AND SCREEN  ABO/RH  PREPARE RBC (CROSSMATCH)    Imaging Review No results found. I have personally reviewed and evaluated these images and lab results as part of my medical decision-making.   EKG Interpretation None      MDM   Final diagnoses:  Lower GI bleed  Anemia, unspecified anemia type  Uremia     77-year-old female here with gross hematochezia and at least 2 point drop in hemoglobin however file this hasn't really equilibrated jet. Slightly tachycardic but no hypotension. Abdomen is benign without any pain. protonix ordered  and also for 2 units to be transfused. No blood thinners required reversed. Discussed case GI that does not think the patient needs to be seen at this time will see in the morning. Also discussed the case with the hospitalist and will admit the patient to the stepdown for continued management.    Merrily Pew, MD 05/17/16 [phone]

## 2016-05-16 NOTE — ED Notes (Signed)
Per EMS, around lunch time patient started experiencing uncontrolled rectal bleeding. The staff nurse at [facility] where patient resides assessed her and suspected hemorrhoidal bleeding. EMS stated pt denies pain at this time.   BP: 143/97 HR: 96 97% Room air

## 2016-05-16 NOTE — ED Notes (Signed)
Patient pull up changed.  Patient has noticeable frank blood, this RN will inform MD.

## 2016-05-16 NOTE — ED Notes (Signed)
Pt had about 56ml rectal bleeding upon arrival to ED.

## 2016-05-17 ENCOUNTER — Observation Stay (HOSPITAL_COMMUNITY): Payer: Medicare Other

## 2016-05-17 ENCOUNTER — Ambulatory Visit: Payer: Self-pay | Admitting: Internal Medicine

## 2016-05-17 ENCOUNTER — Encounter (HOSPITAL_COMMUNITY): Payer: Self-pay | Admitting: Internal Medicine

## 2016-05-17 DIAGNOSIS — K5731 Diverticulosis of large intestine without perforation or abscess with bleeding: Secondary | ICD-10-CM | POA: Insufficient documentation

## 2016-05-17 DIAGNOSIS — K219 Gastro-esophageal reflux disease without esophagitis: Secondary | ICD-10-CM

## 2016-05-17 DIAGNOSIS — D62 Acute posthemorrhagic anemia: Secondary | ICD-10-CM

## 2016-05-17 DIAGNOSIS — I4892 Unspecified atrial flutter: Secondary | ICD-10-CM

## 2016-05-17 DIAGNOSIS — D649 Anemia, unspecified: Secondary | ICD-10-CM

## 2016-05-17 DIAGNOSIS — K922 Gastrointestinal hemorrhage, unspecified: Secondary | ICD-10-CM

## 2016-05-17 LAB — CBC
HCT: 22.1 % — ABNORMAL LOW (ref 36.0–46.0)
HEMATOCRIT: 31.4 % — AB (ref 36.0–46.0)
HEMATOCRIT: 24.8 % — AB (ref 36.0–46.0)
HEMATOCRIT: 23.3 % — AB (ref 36.0–46.0)
HEMOGLOBIN: 10.6 g/dL — AB (ref 12.0–15.0)
HEMOGLOBIN: 8.4 g/dL — AB (ref 12.0–15.0)
HEMOGLOBIN: 7.9 g/dL — AB (ref 12.0–15.0)
HEMOGLOBIN: 7.4 g/dL — AB (ref 12.0–15.0)
MCH: 31 pg (ref 26.0–34.0)
MCH: 31 pg (ref 26.0–34.0)
MCH: 30.9 pg (ref 26.0–34.0)
MCH: 30.5 pg (ref 26.0–34.0)
MCHC: 33.8 g/dL (ref 30.0–36.0)
MCHC: 33.9 g/dL (ref 30.0–36.0)
MCHC: 33.9 g/dL (ref 30.0–36.0)
MCHC: 33.5 g/dL (ref 30.0–36.0)
MCV: 91.8 fL (ref 78.0–100.0)
MCV: 91.5 fL (ref 78.0–100.0)
MCV: 91 fL (ref 78.0–100.0)
MCV: 90.9 fL (ref 78.0–100.0)
PLATELETS: 219 10*3/uL (ref 150–400)
Platelets: 190 10*3/uL (ref 150–400)
Platelets: 183 10*3/uL (ref 150–400)
Platelets: 188 10*3/uL (ref 150–400)
RBC: 3.42 MIL/uL — ABNORMAL LOW (ref 3.87–5.11)
RBC: 2.71 MIL/uL — AB (ref 3.87–5.11)
RBC: 2.56 MIL/uL — ABNORMAL LOW (ref 3.87–5.11)
RBC: 2.43 MIL/uL — AB (ref 3.87–5.11)
RDW: 15 % (ref 11.5–15.5)
RDW: 15.2 % (ref 11.5–15.5)
RDW: 15.3 % (ref 11.5–15.5)
RDW: 15.5 % (ref 11.5–15.5)
WBC: 7.4 10*3/uL (ref 4.0–10.5)
WBC: 7.5 10*3/uL (ref 4.0–10.5)
WBC: 9.9 10*3/uL (ref 4.0–10.5)
WBC: 11.2 10*3/uL — ABNORMAL HIGH (ref 4.0–10.5)

## 2016-05-17 LAB — BASIC METABOLIC PANEL
ANION GAP: 5 (ref 5–15)
BUN: 24 mg/dL — AB (ref 6–20)
CHLORIDE: 112 mmol/L — AB (ref 101–111)
CO2: 26 mmol/L (ref 22–32)
Calcium: 8.1 mg/dL — ABNORMAL LOW (ref 8.9–10.3)
Creatinine, Ser: 0.59 mg/dL (ref 0.44–1.00)
GFR calc Af Amer: >60 mL/min (ref >60)
GLUCOSE: 114 mg/dL — AB (ref 65–99)
POTASSIUM: 4.7 mmol/L (ref 3.5–5.1)
Sodium: 143 mmol/L (ref 135–145)

## 2016-05-17 MED ORDER — LACTATED RINGERS IV SOLN
INTRAVENOUS | Status: DC
  Administered 2016-05-17: 02:00:00 via INTRAVENOUS

## 2016-05-17 MED ORDER — DIFLUPREDNATE 0.05 % OP EMUL
1.0000 [drp] | Freq: Two times a day (BID) | OPHTHALMIC | Status: DC
  Filled 2016-05-17 (×2): qty 5

## 2016-05-17 MED ORDER — LOSARTAN POTASSIUM 25 MG PO TABS
12.5000 mg | ORAL_TABLET | Freq: Every day | ORAL | Status: DC
  Filled 2016-05-17: qty 0.5

## 2016-05-17 MED ORDER — DORZOLAMIDE HCL-TIMOLOL MAL 2-0.5 % OP SOLN
1.0000 [drp] | Freq: Two times a day (BID) | OPHTHALMIC | Status: DC
  Administered 2016-05-17 – 2016-05-19 (×5): 1 [drp] via OPHTHALMIC
  Filled 2016-05-17: qty 10

## 2016-05-17 MED ORDER — ONDANSETRON HCL 4 MG PO TABS: 4.0000 mg | ORAL_TABLET | Freq: Four times a day (QID) | ORAL | Status: DC | PRN

## 2016-05-17 MED ORDER — ISOSORBIDE MONONITRATE ER 60 MG PO TB24
30.0000 mg | ORAL_TABLET | Freq: Every day | ORAL | Status: DC
Start: 2016-05-17 — End: 2016-05-20
  Administered 2016-05-17 – 2016-05-19 (×2): 30 mg via ORAL
  Filled 2016-05-17 (×3): qty 1

## 2016-05-17 MED ORDER — TECHNETIUM TC 99M-LABELED RED BLOOD CELLS IV KIT
24.2000 | PACK | Freq: Once | INTRAVENOUS | Status: AC | PRN
  Administered 2016-05-17: 24.2 via INTRAVENOUS

## 2016-05-17 MED ORDER — PEG 3350-KCL-NA BICARB-NACL 420 G PO SOLR
4000.0000 mL | Freq: Once | ORAL | Status: AC
  Administered 2016-05-17: 4000 mL via ORAL
  Filled 2016-05-17: qty 4000

## 2016-05-17 MED ORDER — PEG 3350-KCL-NA BICARB-NACL 420 G PO SOLR
4000.0000 mL | Freq: Once | ORAL | Status: AC
Start: 2016-05-17 — End: 2016-05-17
  Administered 2016-05-17: 4000 mL via ORAL
  Filled 2016-05-17: qty 4000

## 2016-05-17 MED ORDER — FAMOTIDINE IN NACL 20-0.9 MG/50ML-% IV SOLN
20.0000 mg | Freq: Two times a day (BID) | INTRAVENOUS | Status: DC
  Administered 2016-05-17: 20 mg via INTRAVENOUS
  Filled 2016-05-17: qty 50

## 2016-05-17 MED ORDER — HALOPERIDOL LACTATE 5 MG/ML IJ SOLN: 0.5000 mg | Freq: Four times a day (QID) | INTRAMUSCULAR | Status: DC | PRN

## 2016-05-17 MED ORDER — PREDNISOLONE ACETATE 1 % OP SUSP
1.0000 [drp] | Freq: Four times a day (QID) | OPHTHALMIC | Status: DC
  Administered 2016-05-17 – 2016-05-19 (×7): 1 [drp] via OPHTHALMIC
  Filled 2016-05-17: qty 1

## 2016-05-17 MED ORDER — HALOPERIDOL LACTATE 5 MG/ML IJ SOLN
0.5000 mg | Freq: Four times a day (QID) | INTRAMUSCULAR | Status: DC | PRN
  Administered 2016-05-17: 0.5 mg via INTRAVENOUS
  Filled 2016-05-17: qty 1

## 2016-05-17 MED ORDER — ACETAMINOPHEN 500 MG PO TABS
1000.0000 mg | ORAL_TABLET | Freq: Three times a day (TID) | ORAL | Status: DC | PRN
  Administered 2016-05-17 – 2016-05-19 (×2): 1000 mg via ORAL
  Filled 2016-05-17 (×2): qty 2

## 2016-05-17 MED ORDER — HALOPERIDOL LACTATE 5 MG/ML IJ SOLN
0.5000 mg | Freq: Once | INTRAMUSCULAR | Status: DC
  Administered 2016-05-17: 0.5 mg via INTRAVENOUS
  Filled 2016-05-17: qty 1

## 2016-05-17 MED ORDER — ATENOLOL 25 MG PO TABS
100.0000 mg | ORAL_TABLET | Freq: Every day | ORAL | Status: DC
  Administered 2016-05-17 – 2016-05-19 (×3): 100 mg via ORAL
  Filled 2016-05-17 (×4): qty 4

## 2016-05-17 MED ORDER — HALOPERIDOL LACTATE 5 MG/ML IJ SOLN
0.5000 mg | Freq: Once | INTRAMUSCULAR | Status: AC
  Administered 2016-05-17: 0.5 mg via INTRAVENOUS
  Filled 2016-05-17: qty 1

## 2016-05-17 MED ORDER — LATANOPROST 0.005 % OP SOLN
1.0000 [drp] | Freq: Every day | OPHTHALMIC | Status: DC
  Administered 2016-05-17 – 2016-05-19 (×3): 1 [drp] via OPHTHALMIC
  Filled 2016-05-17: qty 2.5

## 2016-05-17 MED ORDER — DIFLUPREDNATE 0.05 % OP EMUL: 1.0000 [drp] | Freq: Two times a day (BID) | OPHTHALMIC | Status: DC

## 2016-05-17 MED ORDER — SODIUM CHLORIDE 0.9% FLUSH
3.0000 mL | Freq: Two times a day (BID) | INTRAVENOUS | Status: DC
  Administered 2016-05-17 – 2016-05-19 (×4): 3 mL via INTRAVENOUS

## 2016-05-17 MED ORDER — ONDANSETRON HCL 4 MG/2ML IJ SOLN
4.0000 mg | Freq: Four times a day (QID) | INTRAMUSCULAR | Status: DC | PRN
  Administered 2016-05-17: 4 mg via INTRAVENOUS
  Filled 2016-05-17 (×2): qty 2

## 2016-05-17 NOTE — Consult Note (Signed)
Consultation  Referring Provider:  Dr. Aileen Fass    Primary Care Physician:  [name], MD Primary Gastroenterologist: Dr. Ardis Hughs        Reason for Consultation: Lower GI Bleed              HPI:   [name] is a 82 y.o. female with med hx sig for breast cancer, HTN and macular degeneration who presented to the ER on 05/16/16 with complaint of hematochezia.The patient is a fairly accurate historian and is able to tell me that she started with bright red blood in her stool yesterday after eating lunch. She is a resident at [facility] and notified the nurse after her first bowel movement around 12:30 and was told there was a small amount of bright red blood in it. Patient then had another bowel movement about 30 minutes later with more bright red blood and the nurse decided that she should go to the ER. The patient tells me that since that time she has been drinking a bowel prep and has continued with frequent bowel movements, the last one approximately 5-10 minutes before the time of my interview, per nursing this still had "a lot" of bright red blood, but no stool. The patient tells me that she does experience a small amount of abdominal pain before a bowel movement, which goes away afterwards. She denies any previous history of bleeding. The patient does spend a large amount of time recounting her previous colonoscopy in 2009 and following surgery in 2010. Since that time it seems she has been experiencing diarrhea, which was controlled on Imodium. Patient describes that what sounds like Cholestyramine, "a powdery substance mixed with water that tastes like lemon", was added since she has been at the nursing facility. She typically has pretty regular stools which are formed. She denies having strained more or a change in bowel habits proceeding her hematochezia.  Patient's medical history is positive for taking a daily 81 mg aspirin.  Patient denies fever, chills,  heartburn, reflux, shortness of breath, dizziness, syncope or use of NSAIDs.   Previous GI eval: 09/08/08-Colonoscopy-Dr. Ardis Hughs: Incomplete due to stricture in the sigmoid colon which could not be passed by adult or pediatric colonoscopes, multiple lg diverticulum noted, small int and ext hemorrhoids did not pass splenic flexure 09/09/08-Double contrast barium enema: Stricture in the proximal sigmoid colon, thought due to chronic colonic diverticulosis, no definite colonic mass in this region, remaining colon unremarkable. Recommend follow with CT. 09/30/08-CT Abdomen Pelvis with contrast: noncalcified gallstones and/or gallbladder sludge with possible changes of gallbladder adenomyomatosis, small exophytic left renal lesion, left flank hernia containing fat, calcified left pelvis mesenteric mass with surrounding desmoplastic reaction most consistent with carcinoid tumor, lesion is remote from area of narrowing in restosigmoid colon. 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 Meningitis   . Macular degeneration   . Hypertension   . GERD (gastroesophageal reflux disease)   . Hyperlipidemia   .  Elevated hemoglobin A1c   . Cataract   . Cancer [facility]) 1979 mastectomy  . Glaucoma     Right pupil (about 83mm) is larger than left pupil (about 56mm) due to surgery in 1973.  Amblyopia in right eye.    Past Surgical History  Procedure Laterality Date  . Abdominal hysterectomy    . Mastectomy    . Coronary stent placement    . Colon resection    . Eye surgery  1973    Dr. Katy Fitch  . Breast surgery Left 1979    mastectomy  . Skin cancer excision  2008     Squamous cell - left thigh    Family History  Problem Relation Age of Onset  . Hypertension Mother   . Hypertension Father   . CVA Father   . Arthritis Sister     Rheumatoid  . Heart attack Brother   . Heart disease Brother   . Diabetes Daughter   . Hypertension Daughter     Social History  Substance Use Topics  . Smoking status: Never Smoker   . Smokeless tobacco: Never Used  . Alcohol Use: No    Prior to Admission medications   Medication Sig Start Date End Date Taking? Authorizing Provider  acetaminophen (TYLENOL) 500 MG tablet Take 1,000 mg by mouth every 8 (eight) hours as needed for moderate pain.   Yes Historical Provider, MD  aspirin EC 81 MG tablet Take 81 mg by mouth 2 (two) times daily.    Yes Historical Provider, MD  atenolol (TENORMIN) 100 MG tablet Take 1 tablet (100 mg total) by mouth daily. 03/21/16  Yes Unk Pinto, MD  B Complex-C (B-COMPLEX WITH VITAMIN C) tablet Take 1 tablet by mouth daily.     Yes Historical Provider, MD  bumetanide (BUMEX) 2 MG tablet TAKE 1 TABLET BY MOUTH  DAILY FOR BLOOD PRESSURE  AND FLUID 05/29/15  Yes Unk Pinto, MD  Cholecalciferol (VITAMIN D3) 2000 units TABS Take 8,000 Units by mouth daily.   Yes Historical Provider, MD  cholestyramine (QUESTRAN) 4 g packet Take 1 packet by mouth 2 (two) times daily. 01/20/16 01/19/17 Yes Courtney Forcucci, PA-C  dorzolamide-timolol (COSOPT) 22.3-6.8 MG/ML ophthalmic solution Place 1 drop into the left eye 2 (two) times daily.  12/03/14   Yes Historical Provider, MD  DUREZOL 0.05 % EMUL Place 1 drop into the right eye 2 (two) times daily.  12/16/15  Yes Historical Provider, MD  isosorbide mononitrate (IMDUR) 30 MG 24 hr tablet Take 1 tablet by mouth  every night at bedtime 06/01/15  Yes Unk Pinto, MD  latanoprost (XALATAN) 0.005 % ophthalmic solution Place 1 drop into the left eye at bedtime.    Yes Historical Provider, MD  losartan (COZAAR) 100 MG tablet Take one-half tablet by  mouth twice a day for blood pressure 05/29/15  Yes Unk Pinto, MD  meloxicam (MOBIC) 7.5 MG tablet Take 1 tablet (7.5 mg total) by mouth 2 (two) times daily. 10/25/15 10/24/16 Yes Vicie Mutters, PA-C  Pancrelipase, Lip-Prot-Amyl, 6000 units CPEP Take 1 capsule (6,000 Units total) by mouth 3 (three) times daily before meals. 01/25/16  Yes Courtney Forcucci, PA-C  saccharomyces boulardii (FLORASTOR) 250 MG capsule Take 1 capsule (250 mg total) by mouth 2 (two) times daily. 12/30/14  Yes Verlee Monte, MD  Zinc 50 MG TABS Take 50 mg by mouth daily.    Yes Historical Provider, MD    Current Facility-Administered Medications  Medication Dose Route Frequency Provider Last Rate Last Dose  . acetaminophen (TYLENOL) tablet 1,000 mg  1,000 mg Oral Q8H PRN Karmen Bongo, MD      . atenolol (TENORMIN) tablet 100 mg  100 mg Oral Daily Karmen Bongo, MD      . Difluprednate 0.05 % EMUL 1 drop  1 drop Right Eye BID Karmen Bongo, MD      . dorzolamide-timolol (COSOPT) 22.3-6.8 MG/ML ophthalmic solution 1 drop  1 drop Left Eye BID Karmen Bongo, MD      . feeding supplement (ENSURE ENLIVE) (ENSURE ENLIVE) liquid 237 mL  237 mL Oral BID BM Karmen Bongo, MD   237 mL at 05/17/16 1000  . isosorbide mononitrate (IMDUR) 24 hr tablet 30 mg  30 mg Oral Daily Karmen Bongo, MD      . lactated ringers infusion   Intravenous Continuous Karmen Bongo, MD 75 mL/hr at 05/17/16 0500    . latanoprost (XALATAN) 0.005 % ophthalmic solution 1 drop  1 drop Left Eye QHS Karmen Bongo, MD   1 drop at 05/17/16 0219  . ondansetron (ZOFRAN) tablet 4 mg  4 mg Oral Q6H PRN Karmen Bongo, MD       Or  . ondansetron [facility]) injection 4 mg  4 mg Intravenous Q6H PRN Karmen Bongo, MD      . sodium chloride flush (NS) 0.9 % injection 3 mL  3 mL Intravenous Q12H Karmen Bongo, MD   3 mL at 05/17/16 0139    Allergies as of 05/16/2016 - Review Complete 05/16/2016  Allergen Reaction Noted  . Lactose intolerance (gi) Other (See Comments) 10/12/2013     Review of Systems:    Constitutional: No weight loss, fever, chills, weakness or fatigue HEENT: Eyes: No change in vision               Ears, Nose, Throat:  No change in hearing Skin: No rash Cardiovascular: No chest pain, chest pressure or chest discomfort. No palpitations or edema       Respiratory: No SOB or cough Gastrointestinal: See HPI and otherwise negative Genitourinary: No change in urinary frequency Neurological: No headache, dizziness or syncope Musculoskeletal: No new muscle or joint pain Hematologic: No previous anemia Psychiatric: No history of depression or anxiety Endocrinologic: No reports of sweating Allergies: No history of asthma    Physical Exam:  Vital signs in last 24 hours: Temp:  [97.8 F (36.6 C)-99.4 F (37.4 C)] 98.5 F (36.9 C) (06/21 0800) Pulse Rate:  [92-115] 105 (06/21 0050) Resp:  [15-28] 22 (06/21 0800) BP: (96-152)/(50-107) 107/60 mmHg (06/21 0800) SpO2:  [76 %-100 %] 100 % (06/21 0800) Weight:  [129 lb 10.1 oz (58.8 kg)] 129 lb 10.1 oz (58.8 kg) (06/20 2200) Last BM Date: 05/17/16 General:   Pleasant Caucasian female appears to be in NAD, Well developed, Well nourished, alert and cooperative Head:  Normocephalic and atraumatic. Eyes:   PEERL, EOMI. No icterus. Conjunctiva pink. Ears:  Normal auditory acuity. Neck:  Supple Throat: Oral cavity and pharynx without inflammation, swelling or lesion. Teeth in good condition. Lungs: Respirations even and unlabored. Lungs  clear to auscultation bilaterally.   No wheezes, crackles, or rhonchi.  Heart: Normal S1, S2. No MRG. Irregularly irregular rate and rhythm. No peripheral edema, cyanosis or pallor.  Abdomen:  Soft, mild distension, umbilical hernia noted-easily reducible, nontender.  No rebound or guarding. Normal bowel sounds. No appreciable masses or hepatomegaly. Rectal:  Not performed. (Gross hematochezia noted) Msk:  Symmetrical without gross deformities. Peripheral pulses intact.  Extremities:  Without edema, no deformity or joint abnormality. Normal ROM, normal sensation. Neurologic:  Alert and  oriented x4;  grossly normal neurologically.  Skin:   Dry and intact without significant lesions or rashes. Psychiatric: Oriented to person, place and time. Demonstrates good judgement and reason without abnormal affect or behaviors.   LAB RESULTS:  Recent Labs  05/16/16 1900 05/17/16 0510  WBC 5.7 7.4  HGB 10.7* 10.6*  HCT 32.2* 31.4*  PLT 212 190   BMET  Recent Labs  05/16/16 1900 05/17/16 0327  NA 145 143  K 3.0* 4.7  CL 112* 112*  CO2 27 26  GLUCOSE 128* 114*  BUN 24* 24*  CREATININE 0.84 0.59  CALCIUM 8.4* 8.1*   LFT  Recent Labs  05/16/16 1900  PROT 5.5*  ALBUMIN 3.2*  AST 18  ALT 17  ALKPHOS 61  BILITOT 0.9   PT/INR  Recent Labs  05/16/16 1856  LABPROT 14.9  INR 1.15    PREVIOUS ENDOSCOPIES:            See HPI   Impression / Plan:  Impression: 1. Hematochezia: Patient started with hematochezia around 12:30 yesterday with several bowel movements since that time, patient was started on bowel prep overnight by hospitalist, last bowel movement around 10:00 AM with moderate amounts of bright red blood, but no stool per nursing. Patient has almost finished her bowel prep. Hemoglobin has remained stable around 10.6 since time of admission, repeat due in 1 hour. Patient's is tachycardic with pulse 105, blood pressure is normal. She is currently stable and not experiencing  any shortness of breath or dizziness. Consider diverticular versus anastomotic versus AVM versus other source 2. GERD: No symptoms reported at this time 3. H/o partial colectomy: See hpi for details, 01/2009   Plan: 1. Continue supportive measures 2. Will await results of upcoming hemoglobin, if shows large drop, could consider colonoscopy today after patient finishes prep, will discuss with Dr. Hilarie Fredrickson, if remains stable, likely will observe patient today for further signs of acute bleeding and hold on further evaluation as patient is at increased risk due to advanced age 29. Patient to remain nothing by mouth 4. Agree with holding ASA 5. Will discuss above with Dr. Hilarie Fredrickson, please await any further recommendations  Thank you for your kind consultation, we will continue to follow.  Lavone Nian [facility]  05/17/2016, 9:24 AM Pager #: [phone]

## 2016-05-17 NOTE — Care Management Obs Status (Signed)
[place] NOTIFICATION   Patient Details  Name: [name] MRN: [id number] Date of Birth: July 30, 1922   Medicare Observation Status Notification Given:  Yes    Leeroy Cha, RN 05/17/2016, 11:33 AM

## 2016-05-17 NOTE — Progress Notes (Addendum)
[facility] PROGRESS NOTE    Progress Note  [name]  [id number] DOB: October 12, 1922 DOA: 05/16/2016 PCP: [name], MD     Brief Narrative:   [name] is an 82 y.o. female with medical history significant of breast cancer, HTN, and macular degeneration presenting with acute onset of bright red blood per rectum, Last colonoscopy in 2009 that showed multiple diverticuli but a stricture at the sigmoid colon, unable to pass a pediatric scope, she is status post 2 units of packed red blood cells Concilio Protonix IV GI was consulted, she cont to have bloody BM.  Assessment/Plan:  Lower GI bleed: Suspect a diverticular bleed. - She is currently not on any oral anticoagulation, she is only on aspirin which has been held. - She was given 2 units of [employer] start on Protonix IV. - Continues to have bloody bowel movements here on the floor. - Hold antihypertensive medication except for beta blocker.  GERD (gastroesophageal reflux disease) On IV Protonix.  Glaucoma Continue our current home meds.  Atrial flutter, unspecified Continue hold aspirin, not candidate for anticoagulation, in the past she has had paroxysmal atrial fibrillation. She is not  A candidate for anticoagulation due to her history of falls, her CHADs scor 6-7.    DVT prophylaxis: SSCD's Family Communication:grand son Disposition Plan/Barrier to D/C: SDU Code Status:     Code Status Orders        Start     Ordered   05/17/16 0041  Do not attempt resuscitation (DNR)   Continuous    Question Answer Comment  In the event of cardiac or respiratory ARREST Do not call a "code blue"   In the event of cardiac or respiratory ARREST Do not perform Intubation, CPR, defibrillation or ACLS   In the event of cardiac or respiratory ARREST Use medication by any route, position, wound care, and other measures to relive pain and suffering. May use oxygen, suction and manual treatment of  airway obstruction as needed for comfort.      05/17/16 0040    Code Status History    Date Active Date Inactive Code Status Order ID Comments User Context   12/27/2014  2:14 PM 12/30/2014  7:21 PM DNR [id number]  Melton Alar, PA-C Inpatient    Advance Directive Documentation        Most Recent Value   Type of Advance Directive  Healthcare Power of Attorney, Living will   Pre-existing out of facility DNR order (yellow form or pink MOST form)     "MOST" Form in Place?          IV Access:    Peripheral IV   Procedures and diagnostic studies:   No results found.   Medical Consultants:    None.  Anti-Infectives:   None  Subjective:    [name] has had no further bowel movements since she got to the floor.  Objective:    Filed Vitals:   05/17/16 0400 05/17/16 0430 05/17/16 0500 05/17/16 0600  BP:  106/75  128/107  Pulse:      Temp:      TempSrc:      Resp: 28 22 16 18   Height:      Weight:      SpO2: 76% 95% 100% 89%    Intake/Output Summary (Last 24 hours) at 05/17/16 0800 Last data filed at 05/17/16 0501  Gross per 24 hour  Intake 1237.5 ml  Output  0 ml  Net 1237.5 ml   Filed Weights   05/16/16 2200  Weight: 58.8 kg (129 lb 10.1 oz)    Exam: General exam: In no acute distress. Respiratory system: Good air movement and clear to auscultation. Cardiovascular system: S1 & S2 heard, RRR. No JVD, murmurs, rubs, gallops or clicks.  Gastrointestinal system: Abdomen is nondistended, soft and nontender.  Central nervous system: Alert and oriented. No focal neurological deficits. Extremities: No pedal edema. Skin: No rashes, lesions or ulcers Psychiatry: Judgement and insight appear normal. Mood & affect appropriate.    Data Reviewed:    Labs: Basic Metabolic Panel:  Recent Labs Lab 05/16/16 1900 05/17/16 0327  NA 145 143  K 3.0* 4.7  CL 112* 112*  CO2 27 26  GLUCOSE 128* 114*  BUN 24* 24*  CREATININE 0.84 0.59    CALCIUM 8.4* 8.1*   GFR Estimated Creatinine Clearance: 37.1 mL/min (by C-G formula based on Cr of 0.59). Liver Function Tests:  Recent Labs Lab 05/16/16 1900  AST 18  ALT 17  ALKPHOS 61  BILITOT 0.9  PROT 5.5*  ALBUMIN 3.2*   No results for input(s): LIPASE, AMYLASE in the last 168 hours. No results for input(s): AMMONIA in the last 168 hours. Coagulation profile  Recent Labs Lab 05/16/16 1856  INR 1.15    CBC:  Recent Labs Lab 05/16/16 1900 05/17/16 0510  WBC 5.7 7.4  HGB 10.7* 10.6*  HCT 32.2* 31.4*  MCV 93.1 91.8  PLT 212 190   Cardiac Enzymes: No results for input(s): CKTOTAL, CKMB, CKMBINDEX, TROPONINI in the last 168 hours. BNP (last 3 results) No results for input(s): PROBNP in the last 8760 hours. CBG: No results for input(s): GLUCAP in the last 168 hours. D-Dimer: No results for input(s): DDIMER in the last 72 hours. Hgb A1c: No results for input(s): HGBA1C in the last 72 hours. Lipid Profile: No results for input(s): CHOL, HDL, LDLCALC, TRIG, CHOLHDL, LDLDIRECT in the last 72 hours. Thyroid function studies: No results for input(s): TSH, T4TOTAL, T3FREE, THYROIDAB in the last 72 hours.  Invalid input(s): FREET3 Anemia work up: No results for input(s): VITAMINB12, FOLATE, FERRITIN, TIBC, IRON, RETICCTPCT in the last 72 hours. Sepsis Labs:  Recent Labs Lab 05/16/16 1900 05/17/16 0510  WBC 5.7 7.4   Microbiology Recent Results (from the past 240 hour(s))  MRSA PCR Screening     Status: None   Collection Time: 05/16/16  9:43 PM  Result Value Ref Range Status   MRSA by PCR NEGATIVE NEGATIVE Final    Comment:        The GeneXpert MRSA Assay (FDA approved for NASAL specimens only), is one component of a comprehensive MRSA colonization surveillance program. It is not intended to diagnose MRSA infection nor to guide or monitor treatment for MRSA infections.      Medications:   . atenolol  100 mg Oral Daily  . Difluprednate  1  drop Right Eye BID  . dorzolamide-timolol  1 drop Left Eye BID  . famotidine (PEPCID) IV  20 mg Intravenous Q12H  . feeding supplement (ENSURE ENLIVE)  237 mL Oral BID BM  . isosorbide mononitrate  30 mg Oral Daily  . latanoprost  1 drop Left Eye QHS  . losartan  12.5 mg Oral Daily  . sodium chloride flush  3 mL Intravenous Q12H   Continuous Infusions: . lactated ringers 75 mL/hr at 05/17/16 0500    Time spent: 25 min   LOS: 1 day  Charlynne Cousins  [facility] Pager [phone]  *Please refer to [email or web address], password [email or web address] to get updated schedule on who will round on this patient, as hospitalists switch teams weekly. If 7PM-7AM, please contact night-coverage at [email or web address], password [email or web address] for any overnight needs.  05/17/2016, 8:00 AM

## 2016-05-17 NOTE — Progress Notes (Signed)
I saw patient again in nuclear medicine. I have discussed the case with Dr. Clovis Riley with radiology. He reviewed the 1st hour of tagged red cell scan images.  No definite source of GI blood loss currently.  They are going to continue the study. If positive, I recommend she go straight for angiography with IR If negative, additional bowel prep tonight and colon with MAC tomorrow aftrernoon Closely monitor Hgb and support as needed

## 2016-05-17 NOTE — Progress Notes (Signed)
After coming back from procedure, pt had been consistently agitated, confused, and trying to get out of the bed, even with multiple attempts at reorientation. MD made aware, orders in place.   Will continue to monitor.

## 2016-05-18 ENCOUNTER — Observation Stay (HOSPITAL_COMMUNITY): Payer: Medicare Other | Admitting: Anesthesiology

## 2016-05-18 DIAGNOSIS — K921 Melena: Secondary | ICD-10-CM

## 2016-05-18 DIAGNOSIS — Z7189 Other specified counseling: Secondary | ICD-10-CM

## 2016-05-18 DIAGNOSIS — Z515 Encounter for palliative care: Secondary | ICD-10-CM

## 2016-05-18 LAB — CBC
HCT: 30.5 % — ABNORMAL LOW (ref 36.0–46.0)
HEMATOCRIT: 29.2 % — AB (ref 36.0–46.0)
HEMOGLOBIN: 10.5 g/dL — AB (ref 12.0–15.0)
Hemoglobin: 9.9 g/dL — ABNORMAL LOW (ref 12.0–15.0)
MCH: 30.3 pg (ref 26.0–34.0)
MCH: 30.7 pg (ref 26.0–34.0)
MCHC: 33.9 g/dL (ref 30.0–36.0)
MCHC: 34.4 g/dL (ref 30.0–36.0)
MCV: 89.3 fL (ref 78.0–100.0)
MCV: 89.2 fL (ref 78.0–100.0)
PLATELETS: 170 10*3/uL (ref 150–400)
PLATELETS: 177 10*3/uL (ref 150–400)
RBC: 3.27 MIL/uL — ABNORMAL LOW (ref 3.87–5.11)
RBC: 3.42 MIL/uL — AB (ref 3.87–5.11)
RDW: 15.2 % (ref 11.5–15.5)
RDW: 16 % — ABNORMAL HIGH (ref 11.5–15.5)
WBC: 12.4 10*3/uL — AB (ref 4.0–10.5)
WBC: 11.8 10*3/uL — AB (ref 4.0–10.5)

## 2016-05-18 LAB — PREPARE RBC (CROSSMATCH)

## 2016-05-18 LAB — HEMOGLOBIN AND HEMATOCRIT, BLOOD
HCT: 20.4 % — ABNORMAL LOW (ref 36.0–46.0)
HEMOGLOBIN: 6.9 g/dL — AB (ref 12.0–15.0)

## 2016-05-18 MED ORDER — MORPHINE SULFATE (PF) 2 MG/ML IV SOLN
0.5000 mg | Freq: Once | INTRAVENOUS | Status: DC
  Filled 2016-05-18: qty 1

## 2016-05-18 MED ORDER — SODIUM CHLORIDE 0.9 % IV BOLUS (SEPSIS)
500.0000 mL | Freq: Once | INTRAVENOUS | Status: AC
  Administered 2016-05-18: 500 mL via INTRAVENOUS

## 2016-05-18 MED ORDER — QUETIAPINE FUMARATE 50 MG PO TABS
25.0000 mg | ORAL_TABLET | Freq: Every day | ORAL | Status: DC
  Administered 2016-05-18 – 2016-05-19 (×2): 25 mg via ORAL
  Filled 2016-05-18 (×2): qty 1

## 2016-05-18 MED ORDER — SODIUM CHLORIDE 0.9 % IV SOLN
Freq: Once | INTRAVENOUS | Status: AC
  Administered 2016-05-18: 04:00:00 via INTRAVENOUS

## 2016-05-18 MED ORDER — LORAZEPAM 2 MG/ML IJ SOLN: 0.5000 mg | Freq: Once | INTRAMUSCULAR | Status: DC

## 2016-05-18 MED ORDER — LIP MEDEX EX OINT
TOPICAL_OINTMENT | CUTANEOUS | Status: AC
  Administered 2016-05-18: 11:00:00
  Filled 2016-05-18: qty 7

## 2016-05-18 MED ORDER — SODIUM CHLORIDE 0.9 % IV SOLN
Freq: Once | INTRAVENOUS | Status: AC
  Administered 2016-05-18: 08:00:00 via INTRAVENOUS

## 2016-05-18 MED ORDER — SODIUM CHLORIDE 0.9 % IV SOLN
INTRAVENOUS | Status: AC
  Administered 2016-05-18: 02:00:00 via INTRAVENOUS

## 2016-05-18 NOTE — Progress Notes (Signed)
Patient is too confused and agitated and can not safely swallow bowl prep for tomorrow.

## 2016-05-18 NOTE — Progress Notes (Signed)
Patient continued to be agitated and a [employer] cabin crew was ordered. Mitts were on both hands and posey belt was applied to patient's waist during day shift. Patient has pulled out several IV's and was trying to rip off her mitts. Patient's granddaughter was at the bedside and was informed that a [place] safety sitter would be with her grandmother all night because she was pulling at her IV's and other wires. The granddaughter said that she was going to have someone come stay with her grandmother also and asked if that was okay.   Later that night, the owner of [employer] services showed up and had a sitter with him. He was confused at first because he thought that his employee was enough  and thought that he was not being allowed to see the patient. We informed him that we would still have to have a [place] employee sit with the patient for the patient's safety. The sitter said that she was fine to sit with our sitter. A short while later, the granddaughter called asking why the sitter was being denied access to her grandmother. I explained to the granddaughter that the private sitter that she hired was able to sit with the [employer], but that we needed our sitter to stay in the room for the patient's safety and the private sitter was not capable of doing that alone at the time. The granddaughter was okay with that and said that she would call back in the morning to check on her grandmother.

## 2016-05-18 NOTE — Progress Notes (Signed)
Progress Note   Subjective  [name] is a 82 y/o Caucasian female who was admitted on 05/16/16 with complaint of hematochezia. At the time of my interview yest pt was found drinking prep for colonoscopy, with continued signs of acute bleeding, she then proceeded to have a tagged RBC study yesterday afternoon and around that time started to become confused and agitated.  This morning, at time of my interview pt is found wearing safety mitts with two sitters by her side. Per nursing, she did try to pull out her IV and has been disoriented since she came back from Nuclear med yesterday. She is unable to speak to me this morning and does not make direct eye contact. Per nursing she has been NPO since yesterday and was unable to drink anymore prep overnight due to mental status. She did pass one small "maroonish" looking stool overnight. Palliative care has been consulted. She did receive Haldol again this morning due to agitation.   Objective   Vital signs in last 24 hours: Temp:  [97.2 F (36.2 C)-98.6 F (37 C)] 98.6 F (37 C) (06/22 [id number]) Resp:  [15-32] 19 (06/22 0800) BP: (60-176)/(37-156) 127/83 mmHg (06/22 0800) SpO2:  [66 %-100 %] 100 % (06/22 0800) Last BM Date: 05/17/16 General:  Caucasian female in NAD Heart:  Regular rate and rhythm; no murmurs Lungs: Respirations even and unlabored, lungs CTA bilaterally Abdomen:  Soft, nontender and nondistended. Normal bowel sounds. Extremities:  Without edema. Neurological: Disoriented, unable to speak to me this morning, will not make direct eye contact, even when her name is called Psych:  As above, clear change from yesterday  Intake/Output from previous day: 06/21 0701 - 06/22 0700 In: 2749.8 [I.V.:1973.8; Blood:276; IV [id number] Out: -  Intake/Output this shift: Total I/O In: 30 [Blood:30] Out: -   Lab Results:  Recent Labs  05/17/16 1110 05/17/16 1754 05/17/16 2319 05/18/16 0318  WBC 7.5 9.9 11.2*  --    HGB 8.4* 7.9* 7.4* 6.9*  HCT 24.8* 23.3* 22.1* 20.4*  PLT 183 188 219  --    BMET  Recent Labs  05/16/16 1900 05/17/16 0327  NA 145 143  K 3.0* 4.7  CL 112* 112*  CO2 27 26  GLUCOSE 128* 114*  BUN 24* 24*  CREATININE 0.84 0.59  CALCIUM 8.4* 8.1*   LFT  Recent Labs  05/16/16 1900  PROT 5.5*  ALBUMIN 3.2*  AST 18  ALT 17  ALKPHOS 61  BILITOT 0.9   PT/INR  Recent Labs  05/16/16 1856  LABPROT 14.9  INR 1.15    Studies/Results: Nm Gi Blood Loss  05/17/2016  CLINICAL DATA:  Evaluate for GI bleed.  Bright red blood per rectum. EXAM: NUCLEAR MEDICINE GASTROINTESTINAL BLEEDING SCAN TECHNIQUE: Sequential abdominal images were obtained following intravenous administration of Tc-108m labeled red blood cells. RADIOPHARMACEUTICALS:  24.2 mCi Tc-59m in-vitro labeled red cells. COMPARISON:  None. FINDINGS: There are no areas of radiopharmaceutical extravasation to localize the GI bleed. Normal vascular and soft tissue activity. IMPRESSION: No enteric extravasation of contrast to localize GI bleed. Electronically Signed   By: Rolm Baptise M.D.   On: 05/17/2016 16:54       Assessment / Plan:   Impression: 1. Hematochezia: Patient started with hematochezia around 12:30 05/16/16 with several bowel movements since that time, patient was started on bowel prep 05/16/16 by hospitalist, continued with brb in her bowel movements accompanied by clots throughout the day 05/17/16-proceeded to nuclear med for tagged rbc  study which was negative, but unable to be fully completed due to patients agitation and change in mental status while there-since returning to unit, completely disoriented and required Haldol this morning, sitters are by her side, per nursing, one maroonish stool last night-no further this morning, unable to drink second prep at all-hgb continues to drop, pt is on 2nd u of prbcs this morning-last hgb 6.9 2. GERD: No symptoms reported at this time 3. H/o partial colectomy: See hpi  for details, 01/2009   Plan: 1. Continue supportive measures 2. Pt is scheduled for colonoscopy at 2:00 this afternoon with Dr. Hilarie Fredrickson; will discuss with family regarding increased risk due to pt acute mental status and further interventions possibly making worsen as well as the pt inability to further prep overnight 3. Patient to remain NPO 4. Did contact [facility] this morning, spoke with nursing staff who say that [name] did have one day of disorientation about 2 months ago, but is not on any chronic anti-psychotics and is normally "quite with it" 5. Will discuss above with Dr. Hilarie Fredrickson, please await any further recommendations  Thank you for your kind consultation, we will continue to follow.  Principal Problem:   Lower GI bleed Active Problems:   GERD (gastroesophageal reflux disease)   Glaucoma   Atrial flutter, unspecified   GI bleed   Absolute anemia   Diverticulosis of colon with hemorrhage   Acute blood loss anemia     LOS: 2 days   Levin Erp  05/18/2016, 8:52 AM  Pager # [phone]   Addendum: 34- Called and spoke with Grandaughter who was upset on the phone, explaining that she "doesn't like to see her grandmother like that". We did discuss options regarding proceeding with colonoscopy vs observation. She is unable to make that decision currently due to being emotional. Explained that I would check in with her around 12:00 today to see how things were going and to try and make further decisions. She verbalized understanding. JLL

## 2016-05-18 NOTE — Progress Notes (Signed)
Patient BP was low. MD notified and 500cc bolus ordered and maintenance fluids changed. H&H ordered also. Will continue to monitor.

## 2016-05-18 NOTE — Consult Note (Signed)
Consultation Note Date: 05/18/2016   Patient Name: [name]  DOB: 01/29/1922  MRN: [id number]  Age / Sex: 82 y.o., female  PCP: Unk Pinto, MD Referring Physician: Charlynne Cousins, MD  Reason for Consultation: Establishing goals of care  HPI/Patient Profile: 82 y.o. female  with past medical history of breast cancer, htn, macular degeneration, colon stricture s/p partial resection admitted on 05/16/2016 with bright red blood per rectum.   Clinical Assessment and Goals of Care: I met today with patient and her long term caregiver and also discussed with his granddaughter/HCPOA via phone.  [name] reports that her family and feeling as well as possible for as long as possible are the most important things to her.  She states that her grandson has joked that she will live to 115 so that is her goal.  She reports that her physicians have been doing a good job explaining things to her and she is able to recount a fairly detailed description of pertinent events in her recent medical history.  She reported event surrounding her partial colonoscopy in 2009 followed by surgery for her stricture.  She reports that she understands plan for colonoscopy tomorrow and she wants to proceed as planned.  I also called and spoke with her granddaughter via phone.  She reports that they had their questions answered by Dr. Hilarie Fredrickson this afternoon.  She does not see a need to discuss further, but she is agreeable to my following up after the colonoscopy.   SUMMARY OF RECOMMENDATIONS   - Plan for colonoscopy tomorrow.  This has been discussed at length with family (patient and granddaughter report being very appreciative of Dr. Hilarie Fredrickson) and they want to proceed with colonoscopy prior to making any other decisions regarding care goals. - Family is agreeable to f/u for further discussion after colonoscopy  Code  Status/Advance Care Planning:  DNR   Symptom Management:   Denies symptoms at this time  Palliative Prophylaxis:   Aspiration and Frequent Pain Assessment  Psycho-social/Spiritual:   Desire for further Chaplaincy support:Di not address today  Additional Recommendations: Caregiving  Support/Resources  Prognosis:   Unable to determine  Discharge Planning: To Be Determined      Primary Diagnoses: Present on Admission:  . Lower GI bleed . GERD (gastroesophageal reflux disease) . Glaucoma . Atrial flutter, unspecified . GI bleed  I have reviewed the medical record, interviewed the patient and family, and examined the patient. The following aspects are pertinent.  Past Medical History  Diagnosis Date  . Meningitis   . Macular degeneration   . Hypertension   . GERD (gastroesophageal reflux disease)   . Hyperlipidemia   . Elevated hemoglobin A1c   . Cataract   . Cancer [facility] 1979 mastectomy  . Glaucoma     Right pupil (about 45m) is larger than left pupil (about 240m due to surgery in 1973.  Amblyopia in right eye.   Social History   Social History  . Marital Status: Widowed    Spouse Name: N/A  .  Number of Children: N/A  . Years of Education: N/A   Social History Main Topics  . Smoking status: Never Smoker   . Smokeless tobacco: Never Used  . Alcohol Use: No  . Drug Use: No  . Sexual Activity: No   Other Topics Concern  . None   Social History Narrative   Family History  Problem Relation Age of Onset  . Hypertension Mother   . Hypertension Father   . CVA Father   . Arthritis Sister     Rheumatoid  . Heart attack Brother   . Heart disease Brother   . Diabetes Daughter   . Hypertension Daughter    Scheduled Meds: . atenolol  100 mg Oral Daily  . dorzolamide-timolol  1 drop Left Eye BID  . feeding supplement (ENSURE ENLIVE)  237 mL Oral BID BM  . isosorbide mononitrate  30 mg Oral Daily  . latanoprost  1 drop Left Eye QHS  .  morphine  injection  0.5 mg Intravenous Once  . prednisoLONE acetate  1 drop Right Eye QID  . QUEtiapine  25 mg Oral QHS  . sodium chloride flush  3 mL Intravenous Q12H   Continuous Infusions:  PRN Meds:.acetaminophen, haloperidol lactate, ondansetron **OR** ondansetron (ZOFRAN) IV Medications Prior to Admission:  Prior to Admission medications   Medication Sig Start Date End Date Taking? Authorizing Provider  acetaminophen (TYLENOL) 500 MG tablet Take 1,000 mg by mouth every 8 (eight) hours as needed for moderate pain.   Yes Historical Provider, MD  aspirin EC 81 MG tablet Take 81 mg by mouth 2 (two) times daily.    Yes Historical Provider, MD  atenolol (TENORMIN) 100 MG tablet Take 1 tablet (100 mg total) by mouth daily. 03/21/16  Yes Unk Pinto, MD  B Complex-C (B-COMPLEX WITH VITAMIN C) tablet Take 1 tablet by mouth daily.     Yes Historical Provider, MD  bumetanide (BUMEX) 2 MG tablet TAKE 1 TABLET BY MOUTH  DAILY FOR BLOOD PRESSURE  AND FLUID 05/29/15  Yes Unk Pinto, MD  Cholecalciferol (VITAMIN D3) 2000 units TABS Take 8,000 Units by mouth daily.   Yes Historical Provider, MD  cholestyramine (QUESTRAN) 4 g packet Take 1 packet by mouth 2 (two) times daily. 01/20/16 01/19/17 Yes Courtney Forcucci, PA-C  dorzolamide-timolol (COSOPT) 22.3-6.8 MG/ML ophthalmic solution Place 1 drop into the left eye 2 (two) times daily.  12/03/14  Yes Historical Provider, MD  DUREZOL 0.05 % EMUL Place 1 drop into the right eye 2 (two) times daily.  12/16/15  Yes Historical Provider, MD  isosorbide mononitrate (IMDUR) 30 MG 24 hr tablet Take 1 tablet by mouth  every night at bedtime 06/01/15  Yes Unk Pinto, MD  latanoprost (XALATAN) 0.005 % ophthalmic solution Place 1 drop into the left eye at bedtime.    Yes Historical Provider, MD  losartan (COZAAR) 100 MG tablet Take one-half tablet by  mouth twice a day for blood pressure 05/29/15  Yes Unk Pinto, MD  meloxicam (MOBIC) 7.5 MG tablet Take 1 tablet (7.5 mg  total) by mouth 2 (two) times daily. 10/25/15 10/24/16 Yes Vicie Mutters, PA-C  Pancrelipase, Lip-Prot-Amyl, 6000 units CPEP Take 1 capsule (6,000 Units total) by mouth 3 (three) times daily before meals. 01/25/16  Yes Courtney Forcucci, PA-C  saccharomyces boulardii (FLORASTOR) 250 MG capsule Take 1 capsule (250 mg total) by mouth 2 (two) times daily. 12/30/14  Yes Verlee Monte, MD  Zinc 50 MG TABS Take 50 mg by mouth daily.  Yes Historical Provider, MD   Allergies  Allergen Reactions  . Lactose Intolerance (Gi) Other (See Comments)    unknown   Review of Systems  Constitutional: Positive for activity change and fatigue.  Gastrointestinal: Positive for blood in stool.  Psychiatric/Behavioral: Positive for behavioral problems and confusion.  All other systems reviewed and are negative.    Physical Exam General: Alert, awake, in no acute distress.  HEENT: No bruits, no goiter, no JVD Heart: irregular. No murmur appreciated. Lungs: Good air movement, clear Abdomen: Soft, nontender, nondistended, positive bowel sounds.  Ext: No significant edema Skin: Warm and dry Neuro: Grossly intact, nonfocal.   Vital Signs: BP 152/78 mmHg  Pulse 105  Temp(Src) 98.6 F (37 C) (Oral)  Resp 26  Ht 5' 3"  (1.6 m)  Wt 58.8 kg (129 lb 10.1 oz)  BMI 22.97 kg/m2  SpO2 100% Pain Assessment: No/denies pain   Pain Score: 0-No pain   SpO2: SpO2: 100 % O2 Device:SpO2: 100 % O2 Flow Rate: .O2 Flow Rate (L/min): 2 L/min  IO: Intake/output summary:  Intake/Output Summary (Last 24 hours) at 05/18/16 1912 Last data filed at 05/18/16 1041  Gross per 24 hour  Intake 2088.25 ml  Output      0 ml  Net 2088.25 ml    LBM: Last BM Date: 05/18/16 Baseline Weight: Weight: 58.8 kg (129 lb 10.1 oz) Most recent weight: Weight: 58.8 kg (129 lb 10.1 oz)     Palliative Assessment/Data:   Flowsheet Rows        Most Recent Value   Intake Tab    Referral Department  Hospitalist   Unit at Time of  Referral  Intermediate Care Unit   Palliative Care Primary Diagnosis  Other (Comment)   Date Notified  05/18/16   Palliative Care Type  New Palliative care   Reason for referral  Clarify Goals of Care   Date of Admission  05/18/16   Date first seen by Palliative Care  05/18/16   # of days Palliative referral response time  0 Day(s)   # of days IP prior to Palliative referral  0   Clinical Assessment    Palliative Performance Scale Score  40%   Pain Max last 24 hours  0   Pain Min Last 24 hours  0   Psychosocial & Spiritual Assessment    Palliative Care Outcomes    Patient/Family meeting held?  Yes   Who was at the meeting?  patient, granddaughter via phone   Palliative Care Outcomes  Clarified goals of care      Time In: 1655 Time Out: 1810 Time Total: 75 Greater than 50%  of this time was spent counseling and coordinating care related to the above assessment and plan.  Signed by: Micheline Rough, MD   Please contact Palliative Medicine Team phone at [phone] for questions and concerns.  For individual provider: See Shea Evans

## 2016-05-18 NOTE — Progress Notes (Signed)
[facility] PROGRESS NOTE    Progress Note  [name]  [id number] DOB: 1922-03-21 DOA: 05/16/2016 PCP: [name], MD     Brief Narrative:   [name] is an 82 y.o. female with medical history significant of breast cancer, HTN, and macular degeneration presenting with acute onset of bright red blood per rectum, Last colonoscopy in 2009 that showed multiple diverticuli but a stricture at the sigmoid colon, unable to pass a pediatric scope, she is status post 2 units of packed red blood cells Concilio Protonix IV GI was consulted, she cont to have bloody BM.  Assessment/Plan:  Painless Hematochezia: - Aspirin which has been held. - GI was consulted nuclear red blood cell scan no definite source of blood loss. She received bowel prep for possible colonoscopy in the afternoon - Hemoglobin continues to drop it was 10.7 after transfusion this morning is 7.4, getting her 2nd unit of PBRC continue cbc every 12. Consult palliative care.  GERD (gastroesophageal reflux disease) Protonix   Glaucoma: Continue our current home meds.  Atrial flutter, unspecified/paroxysmal A. Fib: Continue hold aspirin, not candidate for anticoagulation. Seems that she is going in and out of atrial fibrillation. She is not  a candidate for anticoagulation due to her history of falls and obviously ongoing GI bleed, her CHADs scor 6-7. We will have to discuss with family the need to meet with palliative care  Acute confusional state: Use cerebral when necessary at night to 12-lead EKG electrolytes stable.  DVT prophylaxis: SSCD's Family Communication:grand son Disposition Plan/Barrier to D/C: SDU Code Status:     Code Status Orders        Start     Ordered   05/17/16 0041  Do not attempt resuscitation (DNR)   Continuous    Question Answer Comment  In the event of cardiac or respiratory ARREST Do not call a "code blue"   In the event of cardiac or respiratory  ARREST Do not perform Intubation, CPR, defibrillation or ACLS   In the event of cardiac or respiratory ARREST Use medication by any route, position, wound care, and other measures to relive pain and suffering. May use oxygen, suction and manual treatment of airway obstruction as needed for comfort.      05/17/16 0040    Code Status History    Date Active Date Inactive Code Status Order ID Comments User Context   12/27/2014  2:14 PM 12/30/2014  7:21 PM DNR [id number]  Melton Alar, PA-C Inpatient    Advance Directive Documentation        Most Recent Value   Type of Advance Directive  Healthcare Power of Attorney, Living will   Pre-existing out of facility DNR order (yellow form or pink MOST form)     "MOST" Form in Place?          IV Access:    Peripheral IV   Procedures and diagnostic studies:   Nm Gi Blood Loss  05/17/2016  CLINICAL DATA:  Evaluate for GI bleed.  Bright red blood per rectum. EXAM: NUCLEAR MEDICINE GASTROINTESTINAL BLEEDING SCAN TECHNIQUE: Sequential abdominal images were obtained following intravenous administration of Tc-28m labeled red blood cells. RADIOPHARMACEUTICALS:  24.2 mCi Tc-67m in-vitro labeled red cells. COMPARISON:  None. FINDINGS: There are no areas of radiopharmaceutical extravasation to localize the GI bleed. Normal vascular and soft tissue activity. IMPRESSION: No enteric extravasation of contrast to localize GI bleed. Electronically Signed   By: Rolm Baptise M.D.   On: 05/17/2016 16:54  Medical Consultants:    None.  Anti-Infectives:   None  Subjective:    [name] no bloody bowel movements.  Objective:    Filed Vitals:   05/18/16 0430 05/18/16 0445 05/18/16 0553 05/18/16 0600  BP: 132/95 107/76 119/80 132/99  Pulse:      Temp: 97.9 F (36.6 C) 97.3 F (36.3 C) 97.7 F (36.5 C)   TempSrc: Oral Oral Oral   Resp: 21  26 24   Height:      Weight:      SpO2: 81%  90% 72%    Intake/Output Summary (Last 24 hours)  at 05/18/16 0754 Last data filed at 05/18/16 0600  Gross per 24 hour  Intake 2749.75 ml  Output      0 ml  Net 2749.75 ml   Filed Weights   05/16/16 2200  Weight: 58.8 kg (129 lb 10.1 oz)    Exam: General exam: In no acute distress. Respiratory system: Good air movement and clear to auscultation. Cardiovascular system: S1 & S2 heard, RRR. Gastrointestinal system: Abdomen is nondistended, soft and nontender.  Central nervous system: Alert and oriented. No focal neurological deficits. Extremities: No pedal edema. Skin: No rashes, lesions or ulcers Psychiatry: Extremely confused only alert and oriented to person   Data Reviewed:    Labs: Basic Metabolic Panel:  Recent Labs Lab 05/16/16 1900 05/17/16 0327  NA 145 143  K 3.0* 4.7  CL 112* 112*  CO2 27 26  GLUCOSE 128* 114*  BUN 24* 24*  CREATININE 0.84 0.59  CALCIUM 8.4* 8.1*   GFR Estimated Creatinine Clearance: 37.1 mL/min (by C-G formula based on Cr of 0.59). Liver Function Tests:  Recent Labs Lab 05/16/16 1900  AST 18  ALT 17  ALKPHOS 61  BILITOT 0.9  PROT 5.5*  ALBUMIN 3.2*   No results for input(s): LIPASE, AMYLASE in the last 168 hours. No results for input(s): AMMONIA in the last 168 hours. Coagulation profile  Recent Labs Lab 05/16/16 1856  INR 1.15    CBC:  Recent Labs Lab 05/16/16 1900 05/17/16 0510 05/17/16 1110 05/17/16 1754 05/17/16 2319 05/18/16 0318  WBC 5.7 7.4 7.5 9.9 11.2*  --   HGB 10.7* 10.6* 8.4* 7.9* 7.4* 6.9*  HCT 32.2* 31.4* 24.8* 23.3* 22.1* 20.4*  MCV 93.1 91.8 91.5 91.0 90.9  --   PLT 212 190 183 188 219  --    Cardiac Enzymes: No results for input(s): CKTOTAL, CKMB, CKMBINDEX, TROPONINI in the last 168 hours. BNP (last 3 results) No results for input(s): PROBNP in the last 8760 hours. CBG: No results for input(s): GLUCAP in the last 168 hours. D-Dimer: No results for input(s): DDIMER in the last 72 hours. Hgb A1c: No results for input(s): HGBA1C in the  last 72 hours. Lipid Profile: No results for input(s): CHOL, HDL, LDLCALC, TRIG, CHOLHDL, LDLDIRECT in the last 72 hours. Thyroid function studies: No results for input(s): TSH, T4TOTAL, T3FREE, THYROIDAB in the last 72 hours.  Invalid input(s): FREET3 Anemia work up: No results for input(s): VITAMINB12, FOLATE, FERRITIN, TIBC, IRON, RETICCTPCT in the last 72 hours. Sepsis Labs:  Recent Labs Lab 05/17/16 0510 05/17/16 1110 05/17/16 1754 05/17/16 2319  WBC 7.4 7.5 9.9 11.2*   Microbiology Recent Results (from the past 240 hour(s))  MRSA PCR Screening     Status: None   Collection Time: 05/16/16  9:43 PM  Result Value Ref Range Status   MRSA by PCR NEGATIVE NEGATIVE Final    Comment:  The GeneXpert MRSA Assay (FDA approved for NASAL specimens only), is one component of a comprehensive MRSA colonization surveillance program. It is not intended to diagnose MRSA infection nor to guide or monitor treatment for MRSA infections.      Medications:   . atenolol  100 mg Oral Daily  . dorzolamide-timolol  1 drop Left Eye BID  . feeding supplement (ENSURE ENLIVE)  237 mL Oral BID BM  . isosorbide mononitrate  30 mg Oral Daily  . latanoprost  1 drop Left Eye QHS  .  morphine injection  0.5 mg Intravenous Once  . prednisoLONE acetate  1 drop Right Eye QID  . sodium chloride flush  3 mL Intravenous Q12H   Continuous Infusions:    Time spent: 25 min   LOS: 2 days   Charlynne Cousins  [facility] Pager [phone]  *Please refer to [email or web address], password [email or web address] to get updated schedule on who will round on this patient, as hospitalists switch teams weekly. If 7PM-7AM, please contact night-coverage at [email or web address], password [email or web address] for any overnight needs.  05/18/2016, 7:54 AM

## 2016-05-19 ENCOUNTER — Encounter (HOSPITAL_COMMUNITY)
Admission: EM | Disposition: A | Discharge status: Home Health | Payer: Self-pay | Source: Home / Self Care | Attending: Internal Medicine

## 2016-05-19 ENCOUNTER — Encounter (HOSPITAL_COMMUNITY): Payer: Self-pay

## 2016-05-19 DIAGNOSIS — D126 Benign neoplasm of colon, unspecified: Secondary | ICD-10-CM

## 2016-05-19 HISTORY — PX: COLONOSCOPY: SHX5424

## 2016-05-19 LAB — CBC
HEMATOCRIT: 28.6 % — AB (ref 36.0–46.0)
HEMATOCRIT: 26.8 % — AB (ref 36.0–46.0)
HEMOGLOBIN: 8.9 g/dL — AB (ref 12.0–15.0)
Hemoglobin: 9.7 g/dL — ABNORMAL LOW (ref 12.0–15.0)
MCH: 30.7 pg (ref 26.0–34.0)
MCH: 30.6 pg (ref 26.0–34.0)
MCHC: 33.9 g/dL (ref 30.0–36.0)
MCHC: 33.2 g/dL (ref 30.0–36.0)
MCV: 90.5 fL (ref 78.0–100.0)
MCV: 92.1 fL (ref 78.0–100.0)
Platelets: 147 10*3/uL — ABNORMAL LOW (ref 150–400)
Platelets: 141 10*3/uL — ABNORMAL LOW (ref 150–400)
RBC: 3.16 MIL/uL — ABNORMAL LOW (ref 3.87–5.11)
RBC: 2.91 MIL/uL — ABNORMAL LOW (ref 3.87–5.11)
RDW: 16.2 % — ABNORMAL HIGH (ref 11.5–15.5)
RDW: 16.2 % — ABNORMAL HIGH (ref 11.5–15.5)
WBC: 7.6 10*3/uL (ref 4.0–10.5)
WBC: 7.4 10*3/uL (ref 4.0–10.5)

## 2016-05-19 LAB — BASIC METABOLIC PANEL
Anion gap: 5 (ref 5–15)
BUN: 22 mg/dL — ABNORMAL HIGH (ref 6–20)
CHLORIDE: 115 mmol/L — AB (ref 101–111)
CO2: 26 mmol/L (ref 22–32)
CREATININE: 0.74 mg/dL (ref 0.44–1.00)
Calcium: 8.6 mg/dL — ABNORMAL LOW (ref 8.9–10.3)
GFR calc non Af Amer: >60 mL/min (ref >60)
Glucose, Bld: 100 mg/dL — ABNORMAL HIGH (ref 65–99)
Potassium: 2.9 mmol/L — ABNORMAL LOW (ref 3.5–5.1)
Sodium: 146 mmol/L — ABNORMAL HIGH (ref 135–145)

## 2016-05-19 SURGERY — CANCELLED PROCEDURE

## 2016-05-19 SURGERY — COLONOSCOPY
Anesthesia: Moderate Sedation

## 2016-05-19 MED ORDER — FENTANYL CITRATE (PF) 100 MCG/2ML IJ SOLN
INTRAMUSCULAR | Status: DC | PRN
  Administered 2016-05-19: 12.5 ug via INTRAVENOUS

## 2016-05-19 MED ORDER — POTASSIUM CHLORIDE 20 MEQ PO PACK
40.0000 meq | PACK | Freq: Once | ORAL | Status: DC
  Filled 2016-05-19: qty 2

## 2016-05-19 MED ORDER — MIDAZOLAM HCL 5 MG/ML IJ SOLN
INTRAMUSCULAR | Status: AC
  Filled 2016-05-19: qty 2

## 2016-05-19 MED ORDER — FENTANYL CITRATE (PF) 100 MCG/2ML IJ SOLN
INTRAMUSCULAR | Status: AC
  Filled 2016-05-19: qty 2

## 2016-05-19 MED ORDER — FLEET ENEMA 7-19 GM/118ML RE ENEM
2.0000 | ENEMA | Freq: Once | RECTAL | Status: AC
  Administered 2016-05-19: 2 via RECTAL
  Filled 2016-05-19: qty 2

## 2016-05-19 MED ORDER — POTASSIUM CHLORIDE 10 MEQ/100ML IV SOLN
10.0000 meq | INTRAVENOUS | Status: AC
  Administered 2016-05-19 (×3): 10 meq via INTRAVENOUS
  Filled 2016-05-19 (×3): qty 100

## 2016-05-19 MED ORDER — MIDAZOLAM HCL 5 MG/5ML IJ SOLN
INTRAMUSCULAR | Status: DC | PRN
  Administered 2016-05-19 (×2): 1 mg via INTRAVENOUS

## 2016-05-19 MED ORDER — POTASSIUM CHLORIDE 20 MEQ/15ML (10%) PO SOLN
40.0000 meq | Freq: Once | ORAL | Status: AC
  Administered 2016-05-19: 40 meq via ORAL
  Filled 2016-05-19: qty 30

## 2016-05-19 NOTE — Op Note (Signed)
[facility] Patient Name: [name] Procedure Date: 05/19/2016 MRN: [id number] Attending MD: Jerene Bears , MD Date of Birth: 10-22-1922 CSN: [id number] Age: 82 Admit Type: Inpatient Procedure:                Colonoscopy Indications:              Hematochezia, Acute post hemorrhagic anemia Providers:                Lajuan Lines. Hilarie Fredrickson, MD, Zenon Mayo, RN, Laverta Baltimore, RN, Ralene Bathe, Technician Referring MD:             [facility] Medicines:                Fentanyl 12.5 micrograms IV, Midazolam 2 mg IV Complications:            No immediate complications. Estimated Blood Loss:     Estimated blood loss: none. Procedure:                Pre-Anesthesia Assessment:                           - Prior to the procedure, a History and Physical                            was performed, and patient medications and                            allergies were reviewed. The patient's tolerance of                            previous anesthesia was also reviewed. The risks                            and benefits of the procedure and the sedation                            options and risks were discussed with the patient.                            All questions were answered, and informed consent                            was obtained. Prior Anticoagulants: The patient has                            taken no previous anticoagulant or antiplatelet                            agents. ASA Grade Assessment: III - A patient with                            severe systemic disease. After reviewing the risks  and benefits, the patient was deemed in                            satisfactory condition to undergo the procedure.                           After obtaining informed consent, the colonoscope                            was passed under direct vision. Throughout the                            procedure, the patient's  blood pressure, pulse, and                            oxygen saturations were monitored continuously. The                            [id number] scope was introduced through                            the anus and advanced to the the cecum, identified                            by appendiceal orifice and ileocecal valve. The                            colonoscopy was performed without difficulty. The                            patient tolerated the procedure well. The quality                            of the bowel preparation was good. The ileocecal                            valve, appendiceal orifice, and rectum were                            photographed. Scope In: 1:04:30 PM Scope Out: 1:23:52 PM Scope Withdrawal Time: 0 hours 10 minutes 53 seconds  Total Procedure Duration: 0 hours 19 minutes 22 seconds  Findings:      The digital rectal exam findings include non-thrombosed external       hemorrhoids.      A few sessile polyps were found in the transverse colon, ascending colon       and cecum. The polyps were 3 to 8 mm in size. Polypectomy was not       attempted due to recent significant lower GI bleeding and unlikelihood       these polyps will lead to significant morbidity.      Multiple small and large-mouthed diverticula were found in the       descending colon, hepatic flexure and ascending colon. There was no       evidence of diverticular bleeding.  Non-bleeding external hemorrhoids were found during retroflexion and       during perianal exam. The hemorrhoids were small.      There is no endoscopic evidence of active bleeding in the entire colon. Impression:               - A few 3 to 8 mm polyps in the transverse colon,                            in the ascending colon and in the cecum. Resection                            not attempted due to recent bleeding.                           - Moderate diverticulosis in the descending colon,                             at the hepatic flexure and in the ascending colon.                            There was no evidence of active diverticular                            bleeding, though this is the most likely source of                            recent lower GI bleeding.                           - Non-bleeding external hemorrhoids.                           - No specimens collected. Moderate Sedation:      Moderate (conscious) sedation was administered by the endoscopy nurse       and supervised by the endoscopist. The following parameters were       monitored: oxygen saturation, heart rate, blood pressure, and response       to care. Total physician intraservice time was 26 minutes. Recommendation:           - Return patient to hospital ward for ongoing care.                           - Soft diet.                           - Continue present medications.                           - No repeat colonoscopy due to age.                           - Monitor for recurrent bleeding. If active                            rebleeding would  recommend repeat tagged RBC study                            and angiography if positive. Procedure Code(s):        --- Professional ---                           [phone], Colonoscopy, flexible; diagnostic, including                            collection of specimen(s) by brushing or washing,                            when performed (separate procedure)                           99152, Moderate sedation services provided by the                            same physician or other qualified health care                            professional performing the diagnostic or                            therapeutic service that the sedation supports,                            requiring the presence of an independent trained                            observer to assist in the monitoring of the                            patient's level of consciousness and physiological                             status; initial 15 minutes of intraservice time,                            patient age 57 years or older                           [phone], Moderate sedation services; each additional                            15 minutes intraservice time Diagnosis Code(s):        --- Professional ---                           D12.3, Benign neoplasm of transverse colon (hepatic                            flexure or splenic flexure)  D12.2, Benign neoplasm of ascending colon                           D12.0, Benign neoplasm of cecum                           K64.4, Residual hemorrhoidal skin tags                           K92.1, Melena (includes Hematochezia)                           D62, Acute posthemorrhagic anemia                           K57.30, Diverticulosis of large intestine without                            perforation or abscess without bleeding CPT copyright 2016 American Medical Association. All rights reserved. The codes documented in this report are preliminary and upon coder review may  be revised to meet current compliance requirements. Jerene Bears, MD 05/19/2016 1:34:29 PM This report has been signed electronically. Number of Addenda: 0

## 2016-05-19 NOTE — Progress Notes (Signed)
Progress Note   Subjective  [name] a 82 year old Caucasian female who was admitted on 05/16/16 with complaint of hematochezia.  Today, the patient is found asleep, with her sitter by her bedside. Per nursing the patient did finish another three quarters of her prep before midnight last night. She did have one small liquid brown stool around midnight and none since. She has had no further rectal bleeding. She has remained mentally stable. Patient denies any new complaints or concerns.   Objective   Vital signs in last 24 hours: Temp:  [97.6 F (36.4 C)-98.6 F (37 C)] 97.7 F (36.5 C) (06/23 0800) Resp:  [15-26] 15 (06/23 0418) BP: (87-153)/(40-126) 153/126 mmHg (06/23 0418) SpO2:  [88 %-100 %] 100 % (06/23 0418) Last BM Date: 05/19/16 General: Caucasian female in NAD Heart:  Regular rate and rhythm; no murmurs Lungs: Respirations even and unlabored, lungs CTA bilaterally Abdomen:  Soft, nontender and nondistended. Normal bowel sounds. Extremities:  Without edema. Neurologic:  Alert and oriented, drowsy Psych:  Cooperative. Normal mood and affect.  Intake/Output from previous day: 06/22 0701 - 06/23 0700 In: 388.5 [Blood:388.5] Out: -   Lab Results:  Recent Labs  05/18/16 1211 05/18/16 1950 05/19/16 0734  WBC 12.4* 11.8* 7.6  HGB 9.9* 10.5* 9.7*  HCT 29.2* 30.5* 28.6*  PLT 170 177 147*   BMET  Recent Labs  05/16/16 1900 05/17/16 0327 05/19/16 0734  NA 145 143 146*  K 3.0* 4.7 2.9*  CL 112* 112* 115*  CO2 27 26 26   GLUCOSE 128* 114* 100*  BUN 24* 24* 22*  CREATININE 0.84 0.59 0.74  CALCIUM 8.4* 8.1* 8.6*   LFT  Recent Labs  05/16/16 1900  PROT 5.5*  ALBUMIN 3.2*  AST 18  ALT 17  ALKPHOS 61  BILITOT 0.9   PT/INR  Recent Labs  05/16/16 1856  LABPROT 14.9  INR 1.15    Studies/Results: Nm Gi Blood Loss  05/17/2016  CLINICAL DATA:  Evaluate for GI bleed.  Bright red blood per rectum. EXAM: NUCLEAR MEDICINE GASTROINTESTINAL  BLEEDING SCAN TECHNIQUE: Sequential abdominal images were obtained following intravenous administration of Tc-38m labeled red blood cells. RADIOPHARMACEUTICALS:  24.2 mCi Tc-30m in-vitro labeled red cells. COMPARISON:  None. FINDINGS: There are no areas of radiopharmaceutical extravasation to localize the GI bleed. Normal vascular and soft tissue activity. IMPRESSION: No enteric extravasation of contrast to localize GI bleed. Electronically Signed   By: Rolm Baptise M.D.   On: 05/17/2016 16:54       Assessment / Plan:   Impression: 1. Hematochezia: Patient started with hematochezia around 12:30 05/16/16 with several bowel movements since that time, patient was started on bowel prep 05/16/16 by hospitalist, continued with brb in her bowel movements accompanied by clots throughout the day 05/17/16-proceeded to nuclear med for tagged rbc study which was negative, but unable to be fully completed due to patients agitation and change in mental status while patient mental status improved yesterday on 05/18/2016, she did continue with some bright red bowel movements on 05/18/16, per nursing was able to finish another three quarters of her liquid prep, her last bowel movement was around midnight and noted as brown, no further bright red blood  2. GERD: No symptoms reported at this time 3. H/o partial colectomy: See hpi for details, 01/2009   Plan: 1. Continue supportive measures 2. Pt is scheduled for colonoscopy at 1:00 this afternoon with Dr.Pyrtle, patient should remain nothing by mouth until after time of procedure 3.  Will order two enemas for this morning 4. Continue to monitor hemoglobin with transfusion as necessary 5. Will discuss above with Dr. Hilarie Fredrickson, please await any further recommendations after time of procedure.  Thank you for your kind consultation, we will continue to follow.  Principal Problem:   Lower GI bleed Active Problems:   GERD (gastroesophageal reflux disease)   Glaucoma    Atrial flutter, unspecified   GI bleed   Absolute anemia   Diverticulosis of colon with hemorrhage   Acute blood loss anemia   LOS: 3 days   Levin Erp  05/19/2016, 9:17 AM  Pager # [phone]

## 2016-05-19 NOTE — Progress Notes (Signed)
[facility] PROGRESS NOTE    Progress Note  [name]  [id number] DOB: 1922-09-07 DOA: 05/16/2016 PCP: [name], MD     Brief Narrative:   [name] is an 82 y.o. female with medical history significant of breast cancer, HTN, and macular degeneration presenting with acute onset of bright red blood per rectum, Last colonoscopy in 2009 that showed multiple diverticuli but a stricture at the sigmoid colon, unable to pass a pediatric scope, she is status post 2 units of packed red blood cells Concilio Protonix IV GI was consulted, she cont to have bloody BM.  Assessment/Plan:  Painless Hematochezia: - Aspirin which has been held. - GI was consulted And appreciate assistance for colonoscopy this afternoon. - Hemoglobin Has remained at 9.7 after getting her 2 unit of PBRC, continue cbc every 12. Appreciate palliative care assistance.  GERD (gastroesophageal reflux disease) Protonix   Glaucoma: Continue our current home meds.  Atrial flutter, unspecified/paroxysmal A. Fib: Continue hold aspirin, not candidate for anticoagulation. She seems to be in sinus rhythm today, E EKG She is not  a candidate for anticoagulation due to her history of falls and obviously ongoing GI bleed, her CHADs scor 6-7. We will have to discuss with family the need to meet with palliative care  Acute confusional state: Use Haldol when necessary at night.  DVT prophylaxis: SSCD's Family Communication:grand son Disposition Plan/Barrier to D/C: SDU Code Status:     Code Status Orders        Start     Ordered   05/17/16 0041  Do not attempt resuscitation (DNR)   Continuous    Question Answer Comment  In the event of cardiac or respiratory ARREST Do not call a "code blue"   In the event of cardiac or respiratory ARREST Do not perform Intubation, CPR, defibrillation or ACLS   In the event of cardiac or respiratory ARREST Use medication by any route, position, wound  care, and other measures to relive pain and suffering. May use oxygen, suction and manual treatment of airway obstruction as needed for comfort.      05/17/16 0040    Code Status History    Date Active Date Inactive Code Status Order ID Comments User Context   12/27/2014  2:14 PM 12/30/2014  7:21 PM DNR [id number]  Melton Alar, PA-C Inpatient    Advance Directive Documentation        Most Recent Value   Type of Advance Directive  Healthcare Power of Attorney, Living will   Pre-existing out of facility DNR order (yellow form or pink MOST form)     "MOST" Form in Place?          IV Access:    Peripheral IV   Procedures and diagnostic studies:   Nm Gi Blood Loss  05/17/2016  CLINICAL DATA:  Evaluate for GI bleed.  Bright red blood per rectum. EXAM: NUCLEAR MEDICINE GASTROINTESTINAL BLEEDING SCAN TECHNIQUE: Sequential abdominal images were obtained following intravenous administration of Tc-85m labeled red blood cells. RADIOPHARMACEUTICALS:  24.2 mCi Tc-60m in-vitro labeled red cells. COMPARISON:  None. FINDINGS: There are no areas of radiopharmaceutical extravasation to localize the GI bleed. Normal vascular and soft tissue activity. IMPRESSION: No enteric extravasation of contrast to localize GI bleed. Electronically Signed   By: Rolm Baptise M.D.   On: 05/17/2016 16:54     Medical Consultants:    None.  Anti-Infectives:   None  Subjective:    [name] no bloody bowel  movements.  Objective:    Filed Vitals:   05/19/16 0027 05/19/16 0300 05/19/16 0418 05/19/16 0447  BP:   153/126   Pulse:      Temp: 98.3 F (36.8 C)   98.2 F (36.8 C)  TempSrc: Axillary   Axillary  Resp:  19 15   Height:      Weight:      SpO2:  100% 100%     Intake/Output Summary (Last 24 hours) at 05/19/16 0814 Last data filed at 05/18/16 1041  Gross per 24 hour  Intake  358.5 ml  Output      0 ml  Net  358.5 ml   Filed Weights   05/16/16 2200  Weight: 58.8 kg (129 lb  10.1 oz)    Exam: General exam: In no acute distress. Respiratory system: Good air movement and clear to auscultation. Cardiovascular system: S1 & S2 heard, RRR. Gastrointestinal system: Abdomen is nondistended, soft and nontender.  Central nervous system: Alert and oriented. No focal neurological deficits. Extremities: No pedal edema. Skin: No rashes, lesions or ulcers    Data Reviewed:    Labs: Basic Metabolic Panel:  Recent Labs Lab 05/16/16 1900 05/17/16 0327  NA 145 143  K 3.0* 4.7  CL 112* 112*  CO2 27 26  GLUCOSE 128* 114*  BUN 24* 24*  CREATININE 0.84 0.59  CALCIUM 8.4* 8.1*   GFR Estimated Creatinine Clearance: 37.1 mL/min (by C-G formula based on Cr of 0.59). Liver Function Tests:  Recent Labs Lab 05/16/16 1900  AST 18  ALT 17  ALKPHOS 61  BILITOT 0.9  PROT 5.5*  ALBUMIN 3.2*   No results for input(s): LIPASE, AMYLASE in the last 168 hours. No results for input(s): AMMONIA in the last 168 hours. Coagulation profile  Recent Labs Lab 05/16/16 1856  INR 1.15    CBC:  Recent Labs Lab 05/17/16 1754 05/17/16 2319 05/18/16 0318 05/18/16 1211 05/18/16 1950 05/19/16 0734  WBC 9.9 11.2*  --  12.4* 11.8* 7.6  HGB 7.9* 7.4* 6.9* 9.9* 10.5* 9.7*  HCT 23.3* 22.1* 20.4* 29.2* 30.5* 28.6*  MCV 91.0 90.9  --  89.3 89.2 90.5  PLT 188 219  --  170 177 147*   Cardiac Enzymes: No results for input(s): CKTOTAL, CKMB, CKMBINDEX, TROPONINI in the last 168 hours. BNP (last 3 results) No results for input(s): PROBNP in the last 8760 hours. CBG: No results for input(s): GLUCAP in the last 168 hours. D-Dimer: No results for input(s): DDIMER in the last 72 hours. Hgb A1c: No results for input(s): HGBA1C in the last 72 hours. Lipid Profile: No results for input(s): CHOL, HDL, LDLCALC, TRIG, CHOLHDL, LDLDIRECT in the last 72 hours. Thyroid function studies: No results for input(s): TSH, T4TOTAL, T3FREE, THYROIDAB in the last 72 hours.  Invalid  input(s): FREET3 Anemia work up: No results for input(s): VITAMINB12, FOLATE, FERRITIN, TIBC, IRON, RETICCTPCT in the last 72 hours. Sepsis Labs:  Recent Labs Lab 05/17/16 2319 05/18/16 1211 05/18/16 1950 05/19/16 0734  WBC 11.2* 12.4* 11.8* 7.6   Microbiology Recent Results (from the past 240 hour(s))  MRSA PCR Screening     Status: None   Collection Time: 05/16/16  9:43 PM  Result Value Ref Range Status   MRSA by PCR NEGATIVE NEGATIVE Final    Comment:        The GeneXpert MRSA Assay (FDA approved for NASAL specimens only), is one component of a comprehensive MRSA colonization surveillance program. It is not intended to diagnose  MRSA infection nor to guide or monitor treatment for MRSA infections.      Medications:   . atenolol  100 mg Oral Daily  . dorzolamide-timolol  1 drop Left Eye BID  . feeding supplement (ENSURE ENLIVE)  237 mL Oral BID BM  . isosorbide mononitrate  30 mg Oral Daily  . latanoprost  1 drop Left Eye QHS  .  morphine injection  0.5 mg Intravenous Once  . prednisoLONE acetate  1 drop Right Eye QID  . QUEtiapine  25 mg Oral QHS  . sodium chloride flush  3 mL Intravenous Q12H   Continuous Infusions:    Time spent: 25 min   LOS: 3 days   Charlynne Cousins  [facility] Pager [phone]  *Please refer to [email or web address], password [email or web address] to get updated schedule on who will round on this patient, as hospitalists switch teams weekly. If 7PM-7AM, please contact night-coverage at [email or web address], password [email or web address] for any overnight needs.  05/19/2016, 8:14 AM

## 2016-05-19 NOTE — Progress Notes (Signed)
I called the patient's granddaughter, [name], can communicated the results of colonoscopy. She thanked me for the call. Advanced diet. Monitor for rebleeding. Remains recs per colonoscopy report. GI available.  Call with questions

## 2016-05-19 NOTE — Clinical Social Work Note (Signed)
CSW has reveiwed chart.  Pt is scheduled to receive a colonoscopy today.  Per MD note family will make determination about course of treatment following the procedure.    CSW spoke to facility staff at [facility] who confirmed that pt has been a resident in their assisted living community since August 2016.  She stated that prior to admission pt was walking with a walker.    CSW will continue to follow and assist with d/c planning needs.  [place], [place]

## 2016-05-20 ENCOUNTER — Encounter (HOSPITAL_COMMUNITY): Payer: Self-pay | Admitting: *Deleted

## 2016-05-20 LAB — BASIC METABOLIC PANEL
Anion gap: 4 — ABNORMAL LOW (ref 5–15)
BUN: 18 mg/dL (ref 6–20)
CO2: 26 mmol/L (ref 22–32)
CREATININE: 0.54 mg/dL (ref 0.44–1.00)
Calcium: 8.3 mg/dL — ABNORMAL LOW (ref 8.9–10.3)
Chloride: 115 mmol/L — ABNORMAL HIGH (ref 101–111)
GFR calc Af Amer: >60 mL/min (ref >60)
GLUCOSE: 96 mg/dL (ref 65–99)
Potassium: 3.2 mmol/L — ABNORMAL LOW (ref 3.5–5.1)
SODIUM: 145 mmol/L (ref 135–145)

## 2016-05-20 LAB — TYPE AND SCREEN
ABO/RH(D): O POS
Antibody Screen: NEGATIVE
UNIT DIVISION: 0
UNIT DIVISION: 0
Unit division: 0
Unit division: 0

## 2016-05-20 MED ORDER — PANCRELIPASE (LIP-PROT-AMYL) 6000-19000 UNITS PO CPEP: 6000.0000 [IU] | ORAL_CAPSULE | Freq: Three times a day (TID) | ORAL | Status: DC

## 2016-05-20 MED ORDER — DIFLUPREDNATE 0.05 % OP EMUL
1.0000 [drp] | Freq: Two times a day (BID) | OPHTHALMIC | Status: DC
  Administered 2016-05-20 – 2016-05-22 (×4): 1 [drp] via OPHTHALMIC
  Filled 2016-05-20: qty 5

## 2016-05-20 MED ORDER — ONDANSETRON HCL 4 MG/2ML IJ SOLN
4.0000 mg | Freq: Four times a day (QID) | INTRAMUSCULAR | Status: DC | PRN
Start: 2016-05-20 — End: 2016-05-22

## 2016-05-20 MED ORDER — QUETIAPINE FUMARATE 25 MG PO TABS
25.0000 mg | ORAL_TABLET | Freq: Every evening | ORAL | Status: DC | PRN
  Filled 2016-05-20: qty 1

## 2016-05-20 MED ORDER — ATENOLOL 100 MG PO TABS
100.0000 mg | ORAL_TABLET | Freq: Every day | ORAL | Status: DC
  Administered 2016-05-20 – 2016-05-22 (×3): 100 mg via ORAL
  Filled 2016-05-20 (×2): qty 1
  Filled 2016-05-20: qty 4

## 2016-05-20 MED ORDER — SODIUM CHLORIDE 0.9 % IV SOLN: 1.0000 mg/h | INTRAVENOUS | Status: DC

## 2016-05-20 MED ORDER — ENSURE ENLIVE PO LIQD
237.0000 mL | Freq: Two times a day (BID) | ORAL | Status: DC
  Administered 2016-05-20 – 2016-05-22 (×5): 237 mL via ORAL

## 2016-05-20 MED ORDER — LATANOPROST 0.005 % OP SOLN
1.0000 [drp] | Freq: Every day | OPHTHALMIC | Status: DC
  Administered 2016-05-20 – 2016-05-21 (×2): 1 [drp] via OPHTHALMIC

## 2016-05-20 MED ORDER — SODIUM CHLORIDE 0.9 % IV SOLN: INTRAVENOUS | Status: AC

## 2016-05-20 MED ORDER — PANCRELIPASE (LIP-PROT-AMYL) 12000-38000 UNITS PO CPEP: 6000.0000 [IU] | ORAL_CAPSULE | Freq: Three times a day (TID) | ORAL | Status: DC

## 2016-05-20 MED ORDER — DORZOLAMIDE HCL-TIMOLOL MAL 2-0.5 % OP SOLN
1.0000 [drp] | Freq: Two times a day (BID) | OPHTHALMIC | Status: DC
  Administered 2016-05-20 – 2016-05-22 (×5): 1 [drp] via OPHTHALMIC

## 2016-05-20 NOTE — Clinical Social Work Note (Signed)
Clinical Social Work Assessment  Patient Details  Name: [name] MRN: [id number] Date of Birth: 1922/06/09  Date of referral:  05/20/16               Reason for consult:  Facility Placement, Discharge Planning                Permission sought to share information with:  Facility Sport and exercise psychologist, Family Supports Permission granted to share information::  Yes, Verbal Permission Granted  Name::     Earvin Hansen  Agency::  [employer]  Relationship::     Contact Information:     Housing/Transportation Living arrangements for the past 2 months:  [place] of Information:  Other (Comment Required) (Granddaughter) Patient Interpreter Needed:  None Criminal Activity/Legal Involvement Pertinent to Current Situation/Hospitalization:  No - Comment as needed Significant Relationships:  Other Family Members Lives with:  Facility Resident Do you feel safe going back to the place where you live?  Yes Need for family participation in patient care:  Yes (Comment)  Care giving concerns:  The patient and granddaughter do not express any care giving concerns. Both plan for discharge back to [facility]F once stable for DC.   Social Worker assessment / plan:  CSW spoke with granddaughter [name] by phone to complete assessment. [name] confirms that the patient is from [facility] and states that the plan is for the patient to return at time of discharge. CSW explained CSW role and process of getting the patient back to the facility once ready. CSW will continue to follow.  Employment status:  Retired Forensic scientist:  Medicare PT Recommendations:  Not assessed at this time [place] / Referral to community resources:  Other (Comment Required) (Information will be sent to [facility])  Patient/Family's Response to care:  The patient's granddaughter is happy with the care the patient has received. She is appreciative of CSW's  assistance.  Patient/Family's Understanding of and Emotional Response to Diagnosis, Current Treatment, and Prognosis:  The patient's granddaughter appears to have a good understanding of the reason for the patient's hospitalization. She understands that the patient will need to return to ALF or higher level of care at time of DC.   Emotional Assessment Appearance:  Appears stated age Attitude/Demeanor/Rapport:  Unable to Assess Affect (typically observed):  Unable to Assess Orientation:  Oriented to Self Alcohol / Substance use:  Not Applicable Psych involvement (Current and /or in the community):  No (Comment)  Discharge Needs  Concerns to be addressed:  Discharge Planning Concerns Readmission within the last 30 days:  No Current discharge risk:  Chronically ill, Cognitively Impaired Barriers to Discharge:  Continued Medical Work up   Rigoberto Noel, LCSW 05/20/2016, 10:54 AM

## 2016-05-20 NOTE — Progress Notes (Signed)
[facility] PROGRESS NOTE    Progress Note  [name]  [id number] DOB: June 02, 1922 DOA: 05/16/2016 PCP: [name], MD     Brief Narrative:   [name] is an 82 y.o. female with medical history significant of breast cancer, HTN, and macular degeneration presenting with acute onset of bright red blood per rectum, Last colonoscopy in 2009 that showed multiple diverticuli but a stricture at the sigmoid colon, unable to pass a pediatric scope, she is status post 2 units of packed red blood cells Concilio Protonix IV GI was consulted, she cont to have bloody BM.  Assessment/Plan:  Painless Hematochezia: - Appreciate GIs assistance colonoscopy performed that showed no obvious source of bleeding only a few polyps with moderate diverticulosis. Continue hold aspirin. - We'll advance her diet, if she rebleeds repeat a tag red blood cell scan. - It seems like her hemoglobin has stabilized, we'll continue to monitor. - She'll be transferred to the regular floor.  GERD (gastroesophageal reflux disease) Protonix   Glaucoma: Continue our current home meds.  Atrial flutter, unspecified/paroxysmal A. Fib: Continue hold aspirin, not candidate for anticoagulation. EKG done shows A. fib rate controlled. She is not  a candidate for anticoagulation due to her history of falls and obviously ongoing GI bleed, her CHADs scor 6-7.  Acute confusional state: Use Haldol when necessary at night.  DVT prophylaxis: SSCD's Family Communication:grand son Disposition Plan/Barrier to D/C: med-surg Code Status:     Code Status Orders        Start     Ordered   05/17/16 0041  Do not attempt resuscitation (DNR)   Continuous    Question Answer Comment  In the event of cardiac or respiratory ARREST Do not call a "code blue"   In the event of cardiac or respiratory ARREST Do not perform Intubation, CPR, defibrillation or ACLS   In the event of cardiac or respiratory ARREST  Use medication by any route, position, wound care, and other measures to relive pain and suffering. May use oxygen, suction and manual treatment of airway obstruction as needed for comfort.      05/17/16 0040    Code Status History    Date Active Date Inactive Code Status Order ID Comments User Context   12/27/2014  2:14 PM 12/30/2014  7:21 PM DNR [id number]  Melton Alar, PA-C Inpatient    Advance Directive Documentation        Most Recent Value   Type of Advance Directive  Healthcare Power of Attorney, Living will   Pre-existing out of facility DNR order (yellow form or pink MOST form)     "MOST" Form in Place?          IV Access:    Peripheral IV   Procedures and diagnostic studies:   No results found.   Medical Consultants:    None.  Anti-Infectives:   None  Subjective:    [name] no bloody bowel movements.  Objective:    Filed Vitals:   05/20/16 0000 05/20/16 0031 05/20/16 0100 05/20/16 0400  BP:  156/97  158/87  Pulse:      Temp: 98.9 F (37.2 C)   98.1 F (36.7 C)  TempSrc: Axillary   Oral  Resp:   14 14  Height:      Weight:      SpO2:   100% 98%    Intake/Output Summary (Last 24 hours) at 05/20/16 0802 Last data filed at 05/19/16 2130  Gross per  24 hour  Intake    150 ml  Output      0 ml  Net    150 ml   Filed Weights   05/16/16 2200  Weight: 58.8 kg (129 lb 10.1 oz)    Exam: General exam: In no acute distress. Respiratory system: Good air movement and clear to auscultation. Cardiovascular system: S1 & S2 heard, RRR. Gastrointestinal system: Abdomen is nondistended, soft and nontender.  Central nervous system: Alert and oriented. No focal neurological deficits. Extremities: No pedal edema. Skin: No rashes, lesions or ulcers    Data Reviewed:    Labs: Basic Metabolic Panel:  Recent Labs Lab 05/16/16 1900 05/17/16 0327 05/19/16 0734 05/20/16 0316  NA 145 143 146* 145  K 3.0* 4.7 2.9* 3.2*  CL 112* 112*  115* 115*  CO2 27 26 26 26   GLUCOSE 128* 114* 100* 96  BUN 24* 24* 22* 18  CREATININE 0.84 0.59 0.74 0.54  CALCIUM 8.4* 8.1* 8.6* 8.3*   GFR Estimated Creatinine Clearance: 37.1 mL/min (by C-G formula based on Cr of 0.54). Liver Function Tests:  Recent Labs Lab 05/16/16 1900  AST 18  ALT 17  ALKPHOS 61  BILITOT 0.9  PROT 5.5*  ALBUMIN 3.2*   No results for input(s): LIPASE, AMYLASE in the last 168 hours. No results for input(s): AMMONIA in the last 168 hours. Coagulation profile  Recent Labs Lab 05/16/16 1856  INR 1.15    CBC:  Recent Labs Lab 05/17/16 2319 05/18/16 0318 05/18/16 1211 05/18/16 1950 05/19/16 0734 05/19/16 1925  WBC 11.2*  --  12.4* 11.8* 7.6 7.4  HGB 7.4* 6.9* 9.9* 10.5* 9.7* 8.9*  HCT 22.1* 20.4* 29.2* 30.5* 28.6* 26.8*  MCV 90.9  --  89.3 89.2 90.5 92.1  PLT 219  --  170 177 147* 141*   Cardiac Enzymes: No results for input(s): CKTOTAL, CKMB, CKMBINDEX, TROPONINI in the last 168 hours. BNP (last 3 results) No results for input(s): PROBNP in the last 8760 hours. CBG: No results for input(s): GLUCAP in the last 168 hours. D-Dimer: No results for input(s): DDIMER in the last 72 hours. Hgb A1c: No results for input(s): HGBA1C in the last 72 hours. Lipid Profile: No results for input(s): CHOL, HDL, LDLCALC, TRIG, CHOLHDL, LDLDIRECT in the last 72 hours. Thyroid function studies: No results for input(s): TSH, T4TOTAL, T3FREE, THYROIDAB in the last 72 hours.  Invalid input(s): FREET3 Anemia work up: No results for input(s): VITAMINB12, FOLATE, FERRITIN, TIBC, IRON, RETICCTPCT in the last 72 hours. Sepsis Labs:  Recent Labs Lab 05/18/16 1211 05/18/16 1950 05/19/16 0734 05/19/16 1925  WBC 12.4* 11.8* 7.6 7.4   Microbiology Recent Results (from the past 240 hour(s))  MRSA PCR Screening     Status: None   Collection Time: 05/16/16  9:43 PM  Result Value Ref Range Status   MRSA by PCR NEGATIVE NEGATIVE Final    Comment:          The GeneXpert MRSA Assay (FDA approved for NASAL specimens only), is one component of a comprehensive MRSA colonization surveillance program. It is not intended to diagnose MRSA infection nor to guide or monitor treatment for MRSA infections.      Medications:   . atenolol  100 mg Oral Daily  . dorzolamide-timolol  1 drop Left Eye BID  . feeding supplement (ENSURE ENLIVE)  237 mL Oral BID BM  . isosorbide mononitrate  30 mg Oral Daily  . latanoprost  1 drop Left Eye QHS  .  morphine injection  0.5 mg Intravenous Once  . prednisoLONE acetate  1 drop Right Eye QID  . QUEtiapine  25 mg Oral QHS  . sodium chloride flush  3 mL Intravenous Q12H   Continuous Infusions:    Time spent: 15 min   LOS: 4 days   Charlynne Cousins  [facility] Pager [phone]  *Please refer to [email or web address], password [email or web address] to get updated schedule on who will round on this patient, as hospitalists switch teams weekly. If 7PM-7AM, please contact night-coverage at [email or web address], password [email or web address] for any overnight needs.  05/20/2016, 8:02 AM

## 2016-05-21 DIAGNOSIS — I480 Paroxysmal atrial fibrillation: Secondary | ICD-10-CM

## 2016-05-21 NOTE — Discharge Summary (Addendum)
Physician Discharge Summary  [name] [id number] DOB: [birth year] DOA: 05/16/2016  PCP: Julie Richards, MD  Admit date: 05/16/2016 Discharge date: 05/21/2016  Time spent: 41minutes  Recommendations for Outpatient Follow-up:  1. Check a CBC in 1 week.   Discharge Diagnoses:  Principal Problem:   Lower GI bleed Active Problems:   GERD (gastroesophageal reflux disease)   Glaucoma   Atrial flutter, unspecified   GI bleed   Absolute anemia   Diverticulosis of colon with hemorrhage   Acute blood loss anemia   Paroxysmal a-fib (HCC)   Discharge Condition: stable  Diet recommendation: heart healthy  Filed Weights   05/16/16 2200 05/20/16 1210  Weight: 58.8 kg (129 lb 10.1 oz) 65.8 kg (145 lb 1 oz)    History of present illness:  82 y.o. female with medical history significant of breast cancer, HTN, and macular degeneration presenting with acute onset of bright red blood per rectum.   Hospital Course:  Lower GI bleed/Painless hematochezia likely due to diverticulosis: She was limited to the step down aspirin was held GI was consulted who performed a aggregate blood cell scan showed no source of bleeding as she had several bloody bowel movements after that a colonoscopy was done that showed moderate diverticulosis and a few polyps., She was monitored and she tolerated her diet her hemoglobin remained stable.  Paroxysmal atrial fibrillation: She is not a candidate for anticoagulation due to multiple falls, and obviously due to her recent GI bleed, her CHADs scor 6-7. Risk and benefits of and correlation were discussed with the granddaughter and they agreed that it would be better off to keep her off anticoagulation on only due to her GI bleed but also due to her history of multiple falls.  GERD Glaucoma  Procedures:  Colonoscopy  RBC tagg scan  Consultations:  GI  Discharge Exam: Filed Vitals:   05/20/16 2153 05/21/16 0634  BP: 143/91 136/80  Pulse:  93 89  Temp: 97.6 F (36.4 C) 97.9 F (36.6 C)  Resp: 18 20    General: A&O x3 Cardiovascular: RRR Respiratory: good air movement CTA B/L  Discharge Instructions   Discharge Instructions    Diet - low sodium heart healthy    Complete by:  As directed      Increase activity slowly    Complete by:  As directed           Current Discharge Medication List    CONTINUE these medications which have NOT CHANGED   Details  acetaminophen (TYLENOL) 500 MG tablet Take 1,000 mg by mouth every 8 (eight) hours as needed for moderate pain.    atenolol (TENORMIN) 100 MG tablet Take 1 tablet (100 mg total) by mouth daily. Qty: 90 tablet, Refills: 1    B Complex-C (B-COMPLEX WITH VITAMIN C) tablet Take 1 tablet by mouth daily.      bumetanide (BUMEX) 2 MG tablet TAKE 1 TABLET BY MOUTH  DAILY FOR BLOOD PRESSURE  AND FLUID Qty: 90 tablet, Refills: 99    Cholecalciferol (VITAMIN D3) 2000 units TABS Take 8,000 Units by mouth daily.    cholestyramine (QUESTRAN) 4 g packet Take 1 packet by mouth 2 (two) times daily. Qty: 60 each, Refills: 11   Associated Diagnoses: Chronic diarrhea    dorzolamide-timolol (COSOPT) 22.3-6.8 MG/ML ophthalmic solution Place 1 drop into the left eye 2 (two) times daily.  Refills: 0    DUREZOL 0.05 % EMUL Place 1 drop into the right eye 2 (two) times daily.  isosorbide mononitrate (IMDUR) 30 MG 24 hr tablet Take 1 tablet by mouth  every night at bedtime Qty: 15 tablet, Refills: 0   Associated Diagnoses: ASHD (arteriosclerotic heart disease)    latanoprost (XALATAN) 0.005 % ophthalmic solution Place 1 drop into the left eye at bedtime.     losartan (COZAAR) 100 MG tablet Take one-half tablet by  mouth twice a day for blood pressure Qty: 90 tablet, Refills: 99    Pancrelipase, Lip-Prot-Amyl, 6000 units CPEP Take 1 capsule (6,000 Units total) by mouth 3 (three) times daily before meals. Qty: 450 capsule, Refills: 1    Zinc 50 MG TABS Take 50 mg by mouth  daily.       STOP taking these medications     meloxicam (MOBIC) 7.5 MG tablet      saccharomyces boulardii (FLORASTOR) 250 MG capsule        Allergies  Allergen Reactions  . Lactose Intolerance (Gi) Other (See Comments)    unknown   Follow-up Information    Follow up with Julie Richards, MD In 2 weeks.   Specialty:  Internal Medicine   Why:  hospital follow up   Contact information:   [address] [place] [address] [phone]        The results of significant diagnostics from this hospitalization (including imaging, microbiology, ancillary and laboratory) are listed below for reference.    Significant Diagnostic Studies: Nm Gi Blood Loss  05/17/2016  CLINICAL DATA:  Evaluate for GI bleed.  Bright red blood per rectum. EXAM: NUCLEAR MEDICINE GASTROINTESTINAL BLEEDING SCAN TECHNIQUE: Sequential abdominal images were obtained following intravenous administration of Tc-59m labeled red blood cells. RADIOPHARMACEUTICALS:  24.2 mCi Tc-4m in-vitro labeled red cells. COMPARISON:  None. FINDINGS: There are no areas of radiopharmaceutical extravasation to localize the GI bleed. Normal vascular and soft tissue activity. IMPRESSION: No enteric extravasation of contrast to localize GI bleed. Electronically Signed   By: Rolm Baptise M.D.   On: 05/17/2016 16:54    Microbiology: Recent Results (from the past 240 hour(s))  MRSA PCR Screening     Status: None   Collection Time: 05/16/16  9:43 PM  Result Value Ref Range Status   MRSA by PCR NEGATIVE NEGATIVE Final    Comment:        The GeneXpert MRSA Assay (FDA approved for NASAL specimens only), is one component of a comprehensive MRSA colonization surveillance program. It is not intended to diagnose MRSA infection nor to guide or monitor treatment for MRSA infections.      Labs: Basic Metabolic Panel:  Recent Labs Lab 05/16/16 1900 05/17/16 0327 05/19/16 0734 05/20/16 0316  NA 145 143  146* 145  K 3.0* 4.7 2.9* 3.2*  CL 112* 112* 115* 115*  CO2 27 26 26 26   GLUCOSE 128* 114* 100* 96  BUN 24* 24* 22* 18  CREATININE 0.84 0.59 0.74 0.54  CALCIUM 8.4* 8.1* 8.6* 8.3*   Liver Function Tests:  Recent Labs Lab 05/16/16 1900  AST 18  ALT 17  ALKPHOS 61  BILITOT 0.9  PROT 5.5*  ALBUMIN 3.2*   No results for input(s): LIPASE, AMYLASE in the last 168 hours. No results for input(s): AMMONIA in the last 168 hours. CBC:  Recent Labs Lab 05/17/16 2319 05/18/16 0318 05/18/16 1211 05/18/16 1950 05/19/16 0734 05/19/16 1925  WBC 11.2*  --  12.4* 11.8* 7.6 7.4  HGB 7.4* 6.9* 9.9* 10.5* 9.7* 8.9*  HCT 22.1* 20.4* 29.2* 30.5* 28.6* 26.8*  MCV 90.9  --  89.3 89.2 90.5 92.1  PLT 219  --  170 177 147* 141*   Cardiac Enzymes: No results for input(s): CKTOTAL, CKMB, CKMBINDEX, TROPONINI in the last 168 hours. BNP: BNP (last 3 results) No results for input(s): BNP in the last 8760 hours.  ProBNP (last 3 results) No results for input(s): PROBNP in the last 8760 hours.  CBG: No results for input(s): GLUCAP in the last 168 hours.     Signed:  Charlynne Cousins MD.  [facility] 05/21/2016, 8:12 AM

## 2016-05-21 NOTE — Progress Notes (Signed)
LCSW reviewed chart and noticed patient had a dc summary. Call placed to facility: [facility]. Call placed to [place]:  [phone]  Patient cannot be discharged today due to no RN in the building.  Patient can be discharged first thing in the morning. LCSW completed FL2 and faxed DC summary to facility for review and during conversation updated regarding patient progress.  MD made aware of delay of DC. Facility to accept back first thing in morning.  Will notify unit CSW.  Plan: Return to ALF 05/22/16 in AM. Packet completed for CSW and just need to verify transportation in AM.  Lane Hacker, MSW Clinical Social Work: [place] [phone]

## 2016-05-21 NOTE — Evaluation (Signed)
Physical Therapy Evaluation Patient Details Name: [name] MRN: [id number] DOB: 12-24-1921 Today's Date: 05/21/2016   History of Present Illness  Pt admitted with GIB and rectal bleed.  Pt with hx of macular degeneration and mastectomy  Clinical Impression  Pt admitted with GIB and rectal bleed and presenting with functional mobility limitations 2* generalized weakness, very ltd endurance and balance deficits.  Pt would benefit from follow up rehab at SNF level to further address deficits.    Follow Up Recommendations SNF    Equipment Recommendations  None recommended by PT    Recommendations for Other Services OT consult     Precautions / Restrictions Precautions Precautions: Fall Restrictions Weight Bearing Restrictions: No      Mobility  Bed Mobility Overal bed mobility: Needs Assistance Bed Mobility: Supine to Sit     Supine to sit: Mod assist     General bed mobility comments: cues for sequence and mod assist to complete roll to side and transition to sitting upright.  Pt utilizing bed rail to self assist  Transfers Overall transfer level: Needs assistance Equipment used: Rolling walker (2 wheeled) Transfers: Sit to/from [facility] Sit to Stand: Mod assist;+2 safety/equipment;From elevated surface Stand pivot transfers: Mod assist       General transfer comment: cues for transition position and use of UEs to self assist.  Mod assist to bring wt up and fwd and balance with initial standing  Ambulation/Gait Ambulation/Gait assistance: Mod assist;+2 safety/equipment Ambulation Distance (Feet): 8 Feet Assistive device: Rolling walker (2 wheeled) Gait Pattern/deviations: Step-to pattern;Step-through pattern;Decreased step length - left;Shuffle;Trunk flexed Gait velocity: decr   General Gait Details: cues for posture and position from RW.  Assist for balance/support and to control RW.  Distance ltd by fatigue  Stairs             Wheelchair Mobility    Modified Rankin (Stroke Patients Only)       Balance Overall balance assessment: Needs assistance Sitting-balance support: Feet supported;Bilateral upper extremity supported Sitting balance-Leahy Scale: Good     Standing balance support: Bilateral upper extremity supported Standing balance-Leahy Scale: Poor                               Pertinent Vitals/Pain Pain Assessment: No/denies pain    Home Living Family/patient expects to be discharged to:: Assisted living               Home Equipment: Walker - 2 wheels      Prior Function Level of Independence: Needs assistance   Gait / Transfers Assistance Needed: RW  ADL's / Homemaking Assistance Needed: Pt reports facility provides meals and laundry service but pt was IND with basic mobility tasks        Hand Dominance        Extremity/Trunk Assessment   Upper Extremity Assessment: Generalized weakness           Lower Extremity Assessment: Generalized weakness      Cervical / Trunk Assessment: Kyphotic  Communication   Communication: HOH  Cognition Arousal/Alertness: Awake/alert Behavior During Therapy: WFL for tasks assessed/performed Overall Cognitive Status: Within Functional Limits for tasks assessed                      General Comments      Exercises        Assessment/Plan    PT Assessment Patient needs continued PT services  PT Diagnosis Difficulty walking   PT Problem List Decreased strength;Decreased activity tolerance;Decreased balance;Decreased mobility;Decreased knowledge of use of DME;Decreased safety awareness  PT Treatment Interventions DME instruction;Gait training;Functional mobility training;Therapeutic activities;Therapeutic exercise;Balance training;Patient/family education   PT Goals (Current goals can be found in the Care Plan section) Acute Rehab PT Goals Patient Stated Goal: Get out of this bed.  I have not been up  since I got here PT Goal Formulation: With patient Time For Goal Achievement: 06/03/16 Potential to Achieve Goals: Fair    Frequency Min 3X/week   Barriers to discharge        Co-evaluation               End of Session Equipment Utilized During Treatment: Gait belt Activity Tolerance: Patient limited by fatigue Patient left: in chair;with call bell/phone within reach;with family/visitor present;with chair alarm set Nurse Communication: Mobility status         Time: [id number] PT Time Calculation (min) (ACUTE ONLY): 32 min   Charges:   PT Evaluation $PT Eval Low Complexity: 1 Procedure PT Treatments $Gait Training: 8-22 mins   PT G Codes:        Elius Etheredge Jun 03, 2016, 3:45 PM

## 2016-05-21 NOTE — NC FL2 (Signed)
MEDICAID FL2 LEVEL OF CARE SCREENING TOOL     IDENTIFICATION  Patient Name: [name] Birthdate: [birth year] Sex: female Admission Date (Current Location): 05/16/2016  [facility] and [place] Number:  Herbalist and Address:  [facility],  [address], [place]      Provider Number: [id number]  Attending Physician Name and Address:  Charlynne Cousins, MD  Relative Name and Phone Number:       Current Level of Care: Hospital Recommended Level of Care: [place] Prior Approval Number:    Date Approved/Denied:   PASRR Number:    Discharge Plan: Other (Comment) (ALF)    Current Diagnoses: Patient Active Problem List   Diagnosis Date Noted  . Paroxysmal a-fib ([place]) 05/21/2016  . Absolute anemia   . Diverticulosis of colon with hemorrhage   . Acute blood loss anemia   . GI bleed 05/16/2016  . Lower GI bleed 05/16/2016  . Osteoarthritis of left knee 10/25/2015  . At high risk for falls 07/15/2015  . Encounter for Medicare annual wellness exam 07/15/2015  . Essential hypertension   . Atrial flutter, unspecified   . Vitamin D deficiency 10/12/2013  . Medication management 10/12/2013  . GERD (gastroesophageal reflux disease)   . Hyperlipidemia   . PreDiabetes   . Glaucoma     Orientation RESPIRATION BLADDER Height & Weight     Self  Normal Incontinent Weight: 145 lb 1 oz (65.8 kg) Height:  5\' 3"  (160 cm)  BEHAVIORAL SYMPTOMS/MOOD NEUROLOGICAL BOWEL NUTRITION STATUS   stable   Incontinent Diet : No salt added  AMBULATORY STATUS COMMUNICATION OF NEEDS Skin   Limited Assist Verbally Normal                       Personal Care Assistance Level of Assistance  Bathing, Feeding, Dressing Bathing Assistance: Limited assistance Feeding assistance: Independent Dressing Assistance: Limited assistance     Functional Limitations Info  Sight, Hearing, Speech Sight Info: Adequate Hearing Info:  Adequate Speech Info: Adequate    SPECIAL CARE FACTORS FREQUENCY                       Contractures Contractures Info: Not present    Additional Factors Info  Code Status, Allergies, Psychotropic Code Status Info: DNR Allergies Info: Lactose Intolerance Psychotropic Info: Seroquel, Haldol         Current Medications (05/21/2016):  This is the current hospital active medication list Current Facility-Administered Medications  Medication Dose Route Frequency Provider Last Rate Last Dose  . atenolol (TENORMIN) tablet 100 mg  100 mg Oral Daily Charlynne Cousins, MD   100 mg at 05/20/16 0931  . Difluprednate 0.05 % EMUL 1 drop  1 drop Right Eye BID Charlynne Cousins, MD   1 drop at 05/20/16 2156  . dorzolamide-timolol (COSOPT) 22.3-6.8 MG/ML ophthalmic solution 1 drop  1 drop Left Eye BID Charlynne Cousins, MD   1 drop at 05/20/16 2157  . feeding supplement (ENSURE ENLIVE) (ENSURE ENLIVE) liquid 237 mL  237 mL Oral BID BM Charlynne Cousins, MD   237 mL at 05/20/16 1708  . latanoprost (XALATAN) 0.005 % ophthalmic solution 1 drop  1 drop Left Eye QHS Charlynne Cousins, MD   1 drop at 05/20/16 2157  . ondansetron (ZOFRAN) injection 4 mg  4 mg Intravenous Q6H PRN Charlynne Cousins, MD      . QUEtiapine (  SEROQUEL) tablet 25 mg  25 mg Oral QHS PRN Charlynne Cousins, MD         Discharge Medications: Please see discharge summary for a list of discharge medications. Current Discharge Medication List    CONTINUE these medications which have NOT CHANGED   Details  acetaminophen (TYLENOL) 500 MG tablet Take 1,000 mg by mouth every 8 (eight) hours as needed for moderate pain.    atenolol (TENORMIN) 100 MG tablet Take 1 tablet (100 mg total) by mouth daily. Qty: 90 tablet, Refills: 1    B Complex-C (B-COMPLEX WITH VITAMIN C) tablet Take 1 tablet by mouth daily.     bumetanide (BUMEX) 2 MG tablet TAKE 1 TABLET BY MOUTH DAILY FOR BLOOD PRESSURE AND  FLUID Qty: 90 tablet, Refills: 99    Cholecalciferol (VITAMIN D3) 2000 units TABS Take 8,000 Units by mouth daily.    cholestyramine (QUESTRAN) 4 g packet Take 1 packet by mouth 2 (two) times daily. Qty: 60 each, Refills: 11   Associated Diagnoses: Chronic diarrhea    dorzolamide-timolol (COSOPT) 22.3-6.8 MG/ML ophthalmic solution Place 1 drop into the left eye 2 (two) times daily.  Refills: 0    DUREZOL 0.05 % EMUL Place 1 drop into the right eye 2 (two) times daily.     isosorbide mononitrate (IMDUR) 30 MG 24 hr tablet Take 1 tablet by mouth every night at bedtime Qty: 15 tablet, Refills: 0   Associated Diagnoses: ASHD (arteriosclerotic heart disease)    latanoprost (XALATAN) 0.005 % ophthalmic solution Place 1 drop into the left eye at bedtime.     losartan (COZAAR) 100 MG tablet Take one-half tablet by mouth twice a day for blood pressure Qty: 90 tablet, Refills: 99    Pancrelipase, Lip-Prot-Amyl, 6000 units CPEP Take 1 capsule (6,000 Units total) by mouth 3 (three) times daily before meals. Qty: 450 capsule, Refills: 1    Zinc 50 MG TABS Take 50 mg by mouth daily.       STOP taking these medications     meloxicam (MOBIC) 7.5 MG tablet      saccharomyces boulardii (FLORASTOR) 250 MG capsule         Relevant Imaging Results:  Relevant Lab Results:   Additional Information SSN: [id number]  [place], LCSW

## 2016-05-22 ENCOUNTER — Encounter (HOSPITAL_COMMUNITY): Payer: Self-pay | Admitting: Internal Medicine

## 2016-05-22 NOTE — Progress Notes (Signed)
Physical Therapy Treatment Patient Details Name: [name] MRN: [id number] DOB: [birth year] Today's Date: 05/22/2016    History of Present Illness Pt admitted with GIB and rectal bleed.  Pt with hx of macular degeneration and mastectomy    PT Comments    Assisted pt OOB to [facility] due to incont urine.  Assisted with hygiene.  amb a limited distance.  Positioned in recliner.    Follow Up Recommendations  Home health PT (pt plans to return to ALF.  Will update LPT pt is not D/C to SNF. )     Equipment Recommendations  None recommended by PT    Recommendations for Other Services       Precautions / Restrictions Restrictions Weight Bearing Restrictions: No    Mobility  Bed Mobility Overal bed mobility: Needs Assistance Bed Mobility: Supine to Sit     Supine to sit: Mod assist     General bed mobility comments: cues for sequence and mod assist to complete roll to side and transition to sitting upright.  Pt utilizing bed rail to self assist  Transfers Overall transfer level: Needs assistance Equipment used: Rolling walker (2 wheeled) Transfers: Sit to/from [facility] Sit to Stand: Mod assist;+2 safety/equipment;From elevated surface Stand pivot transfers: Mod assist       General transfer comment: cues for transition position and use of UEs to self assist.  Mod assist to bring wt up and fwd and balance with initial standing    assisted to [facility] partial turn completion.    Ambulation/Gait Ambulation/Gait assistance: Mod assist;+2 physical assistance;+2 safety/equipment Ambulation Distance (Feet): 5 Feet Assistive device: Rolling walker (2 wheeled) Gait Pattern/deviations: Step-to pattern;Decreased stance time - left;Trunk flexed Gait velocity: decr   General Gait Details: cues for posture and position from RW.  Assist for balance/support and to control RW.  Distance ltd by fatigue and L knee instability.   Stairs            Wheelchair  Mobility    Modified Rankin (Stroke Patients Only)       Balance                                    Cognition Arousal/Alertness: Awake/alert Behavior During Therapy: WFL for tasks assessed/performed Overall Cognitive Status: Within Functional Limits for tasks assessed                      Exercises      General Comments        Pertinent Vitals/Pain Pain Assessment: Faces Faces Pain Scale: Hurts a little bit Pain Location: L knee Pain Descriptors / Indicators: Discomfort Pain Intervention(s): Monitored during session    Home Living                      Prior Function            PT Goals (current goals can now be found in the care plan section) Progress towards PT goals: Progressing toward goals    Frequency  Min 3X/week    PT Plan Current plan remains appropriate    Co-evaluation             End of Session Equipment Utilized During Treatment: Gait belt Activity Tolerance: Patient limited by fatigue Patient left: in chair;with call bell/phone within reach;with family/visitor present;with chair alarm set     Time: [id number] PT Time Calculation (  min) (ACUTE ONLY): 24 min  Charges:  $Gait Training: 8-22 mins $Therapeutic Activity: 8-22 mins                    G Codes:      Rica Koyanagi  PTA [facility]  Acute  Rehab Pager      [phone]

## 2016-05-22 NOTE — Progress Notes (Signed)
Report called to [facility] at [facility].

## 2016-05-22 NOTE — Progress Notes (Signed)
[facility] PROGRESS NOTE    Progress Note  Julie BRUSSO  [id number] DOB: 03/03/1922 DOA: 05/16/2016 PCP: [name], MD     Brief Narrative:   [name] is an 82 y.o. female with medical history significant of breast cancer, HTN, and macular degeneration presenting with acute onset of bright red blood per rectum, Last colonoscopy in 2009 that showed multiple diverticuli but a stricture at the sigmoid colon, unable to pass a pediatric scope, she is status post 2 units of packed red blood cells [facility] Protonix IV GI was consulted, she cont to have bloody BM.  Assessment/Plan:  Painless Hematochezia: - awaiting placement at facility. - no events or changes over night.  GERD (gastroesophageal reflux disease) Protonix   Glaucoma: Continue our current home meds.  Atrial flutter, unspecified/paroxysmal A. Fib: Continue hold aspirin, not candidate for anticoagulation. CHADs scor 6-7.  Acute confusional state: Use Haldol when necessary at night.  DVT prophylaxis: SSCD's Family Communication:grand son Disposition Plan/Barrier to D/C: SNF today Code Status:     Code Status Orders        Start     Ordered   05/17/16 0041  Do not attempt resuscitation (DNR)   Continuous    Question Answer Comment  In the event of cardiac or respiratory ARREST Do not call a "code blue"   In the event of cardiac or respiratory ARREST Do not perform Intubation, CPR, defibrillation or ACLS   In the event of cardiac or respiratory ARREST Use medication by any route, position, wound care, and other measures to relive pain and suffering. May use oxygen, suction and manual treatment of airway obstruction as needed for comfort.      05/17/16 0040    Code Status History    Date Active Date Inactive Code Status Order ID Comments User Context   12/27/2014  2:14 PM 12/30/2014  7:21 PM DNR [id number]  Melton Alar, PA-C Inpatient    Advance Directive Documentation      Most Recent Value   Type of Advance Directive  Healthcare Power of Attorney, Living will   Pre-existing out of facility DNR order (yellow form or pink MOST form)     "MOST" Form in Place?          IV Access:    Peripheral IV   Procedures and diagnostic studies:   No results found.   Medical Consultants:    None.  Anti-Infectives:   None  Subjective:    [name] no bloody bowel movements.  Objective:    Filed Vitals:   05/21/16 1156 05/21/16 1458 05/21/16 2310 05/22/16 0529  BP:  129/64 167/97 158/88  Pulse:  88 86 90  Temp:  98.3 F (36.8 C) 98.4 F (36.9 C) 98.5 F (36.9 C)  TempSrc:  Oral Oral Oral  Resp:  18 18 19   Height:      Weight:      SpO2: 87% 91% 94% 95%    Intake/Output Summary (Last 24 hours) at 05/22/16 0825 Last data filed at 05/22/16 0753  Gross per 24 hour  Intake    120 ml  Output    625 ml  Net   -505 ml   Filed Weights   05/16/16 2200 05/20/16 1210  Weight: 58.8 kg (129 lb 10.1 oz) 65.8 kg (145 lb 1 oz)    Exam: General exam: In no acute distress. Respiratory system: Good air movement and clear to auscultation. Cardiovascular system: S1 & S2 heard, RRR.  Gastrointestinal system: Abdomen is nondistended, soft and nontender.  Central nervous system: Alert and oriented. No focal neurological deficits. Extremities: No pedal edema. Skin: No rashes, lesions or ulcers    Data Reviewed:    Labs: Basic Metabolic Panel:  Recent Labs Lab 05/16/16 1900 05/17/16 0327 05/19/16 0734 05/20/16 0316  NA 145 143 146* 145  K 3.0* 4.7 2.9* 3.2*  CL 112* 112* 115* 115*  CO2 27 26 26 26   GLUCOSE 128* 114* 100* 96  BUN 24* 24* 22* 18  CREATININE 0.84 0.59 0.74 0.54  CALCIUM 8.4* 8.1* 8.6* 8.3*   GFR Estimated Creatinine Clearance: 40.9 mL/min (by C-G formula based on Cr of 0.54). Liver Function Tests:  Recent Labs Lab 05/16/16 1900  AST 18  ALT 17  ALKPHOS 61  BILITOT 0.9  PROT 5.5*  ALBUMIN 3.2*   No  results for input(s): LIPASE, AMYLASE in the last 168 hours. No results for input(s): AMMONIA in the last 168 hours. Coagulation profile  Recent Labs Lab 05/16/16 1856  INR 1.15    CBC:  Recent Labs Lab 05/17/16 2319 05/18/16 0318 05/18/16 1211 05/18/16 1950 05/19/16 0734 05/19/16 1925  WBC 11.2*  --  12.4* 11.8* 7.6 7.4  HGB 7.4* 6.9* 9.9* 10.5* 9.7* 8.9*  HCT 22.1* 20.4* 29.2* 30.5* 28.6* 26.8*  MCV 90.9  --  89.3 89.2 90.5 92.1  PLT 219  --  170 177 147* 141*   Cardiac Enzymes: No results for input(s): CKTOTAL, CKMB, CKMBINDEX, TROPONINI in the last 168 hours. BNP (last 3 results) No results for input(s): PROBNP in the last 8760 hours. CBG: No results for input(s): GLUCAP in the last 168 hours. D-Dimer: No results for input(s): DDIMER in the last 72 hours. Hgb A1c: No results for input(s): HGBA1C in the last 72 hours. Lipid Profile: No results for input(s): CHOL, HDL, LDLCALC, TRIG, CHOLHDL, LDLDIRECT in the last 72 hours. Thyroid function studies: No results for input(s): TSH, T4TOTAL, T3FREE, THYROIDAB in the last 72 hours.  Invalid input(s): FREET3 Anemia work up: No results for input(s): VITAMINB12, FOLATE, FERRITIN, TIBC, IRON, RETICCTPCT in the last 72 hours. Sepsis Labs:  Recent Labs Lab 05/18/16 1211 05/18/16 1950 05/19/16 0734 05/19/16 1925  WBC 12.4* 11.8* 7.6 7.4   Microbiology Recent Results (from the past 240 hour(s))  MRSA PCR Screening     Status: None   Collection Time: 05/16/16  9:43 PM  Result Value Ref Range Status   MRSA by PCR NEGATIVE NEGATIVE Final    Comment:        The GeneXpert MRSA Assay (FDA approved for NASAL specimens only), is one component of a comprehensive MRSA colonization surveillance program. It is not intended to diagnose MRSA infection nor to guide or monitor treatment for MRSA infections.      Medications:   . atenolol  100 mg Oral Daily  . Difluprednate  1 drop Right Eye BID  .  dorzolamide-timolol  1 drop Left Eye BID  . feeding supplement (ENSURE ENLIVE)  237 mL Oral BID BM  . latanoprost  1 drop Left Eye QHS   Continuous Infusions:    Time spent: 15 min   LOS: 6 days   Charlynne Cousins  [facility] Pager [phone]  *Please refer to [email or web address], password [email or web address] to get updated schedule on who will round on this patient, as hospitalists switch teams weekly. If 7PM-7AM, please contact night-coverage at [email or web address], password [email or web address] for any overnight needs.  05/22/2016, 8:25 AM

## 2016-06-04 ENCOUNTER — Encounter: Payer: Self-pay | Admitting: *Deleted

## 2016-06-05 ENCOUNTER — Ambulatory Visit (INDEPENDENT_AMBULATORY_CARE_PROVIDER_SITE_OTHER): Payer: Medicare Other | Admitting: Internal Medicine

## 2016-06-05 ENCOUNTER — Encounter: Payer: Self-pay | Admitting: Internal Medicine

## 2016-06-05 VITALS — BP 102/60 | HR 88 | Temp 98.0°F | Resp 16 | Ht 62.5 in | Wt 123.0 lb

## 2016-06-05 DIAGNOSIS — J189 Pneumonia, unspecified organism: Secondary | ICD-10-CM

## 2016-06-05 DIAGNOSIS — K5900 Constipation, unspecified: Secondary | ICD-10-CM | POA: Diagnosis not present

## 2016-06-05 DIAGNOSIS — K5731 Diverticulosis of large intestine without perforation or abscess with bleeding: Secondary | ICD-10-CM

## 2016-06-05 LAB — BASIC METABOLIC PANEL WITH GFR
BUN: 12 mg/dL (ref 7–25)
CO2: 30 mmol/L (ref 20–31)
Calcium: 8.3 mg/dL — ABNORMAL LOW (ref 8.6–10.4)
Chloride: 104 mmol/L (ref 98–110)
Creat: 0.57 mg/dL — ABNORMAL LOW (ref 0.60–0.88)
GFR, EST NON AFRICAN AMERICAN: 81 mL/min (ref >=60)
GLUCOSE: 122 mg/dL — AB (ref 65–99)
POTASSIUM: 3.2 mmol/L — AB (ref 3.5–5.3)
SODIUM: 143 mmol/L (ref 135–146)

## 2016-06-05 LAB — CBC WITH DIFFERENTIAL/PLATELET
BASOS PCT: 0 %
Basophils Absolute: 0 cells/uL (ref 0–200)
EOS PCT: 2 %
Eosinophils Absolute: 134 cells/uL (ref 15–500)
HEMATOCRIT: 32.4 % — AB (ref 35.0–45.0)
HEMOGLOBIN: 10.4 g/dL — AB (ref 11.7–15.5)
LYMPHS ABS: 1340 {cells}/uL (ref 850–3900)
LYMPHS PCT: 20 %
MCH: 28.7 pg (ref 27.0–33.0)
MCHC: 32.1 g/dL (ref 32.0–36.0)
MCV: 89.5 fL (ref 80.0–100.0)
MONO ABS: 536 {cells}/uL (ref 200–950)
MPV: 9 fL (ref 7.5–12.5)
Monocytes Relative: 8 %
Neutro Abs: 4690 cells/uL (ref 1500–7800)
Neutrophils Relative %: 70 %
Platelets: 317 10*3/uL (ref 140–400)
RBC: 3.62 MIL/uL — AB (ref 3.80–5.10)
RDW: 14.8 % (ref 11.0–15.0)
WBC: 6.7 10*3/uL (ref 3.8–10.8)

## 2016-06-05 MED ORDER — LORATADINE 10 MG PO TABS: 10.0000 mg | ORAL_TABLET | Freq: Every day | ORAL | Status: DC

## 2016-06-05 MED ORDER — POLYETHYLENE GLYCOL 3350 17 G PO PACK: 17.0000 g | PACK | Freq: Every day | ORAL | Status: DC | PRN

## 2016-06-05 MED ORDER — FLUTICASONE PROPIONATE 50 MCG/ACT NA SUSP: 2.0000 | Freq: Every day | NASAL | Status: DC

## 2016-06-05 MED ORDER — DOXYCYCLINE HYCLATE 100 MG PO CAPS: 100.0000 mg | ORAL_CAPSULE | Freq: Two times a day (BID) | ORAL | Status: DC

## 2016-06-05 NOTE — Patient Instructions (Addendum)
Please take doxycycline 100 mg twice daily with food.  This will treat for possible pneumonia.  Please take 1 claritin per day.    Please use 2 sprays of flonase per nostril right before bedtime.  Please use the breo inhaler once daily in the mornings.  Please rinse out your mouth after you use it.  Please use miralax one packet per day mixed with a liquid until you have a bowel movement

## 2016-06-05 NOTE — Progress Notes (Signed)
Assessment and Plan: [phone] (all) [phone] (if 7 days high complexity)  hospital visit follow up for Diverticulosis with hemorrhage and possible HCAP:   HCAP -doxycycline -flonase -tylenol prn -claritin daily -breo sample given  Diverticulosis with hemorrhage Recheck CBC with diff and also BMET -rectal bleeding has resolved.   -stop all ASA -stop all NSAIDs  Constipation -miralax daily until relief of constipation.    Over 40 minutes of exam, counseling, chart review, and complex, Moderate level critical decision making was performed this visit.   HPI 82 y.o.female presents for follow up from the hospital. Admit date to the hospital was 05/16/16, patient was discharged from the hospital on 05/22/16 and our office contacted the office the day after discharge to set up a follow up appointment, patient was admitted for: Severe GI bleed from diverticulosis with polyps which required a blood tranfusion.  She reports that she was doing well since her discharge from the hospital.  She reports that she was sent back to [facility] and has full home health.  She reports that since being discharged she has been tolerating regular diet.  She has developed some coughing and is bringing up some yellow phlegm.  She reports that she has been having runny nose.    Images while in the hospital: Nm Gi Blood Loss  05/17/2016  CLINICAL DATA:  Evaluate for GI bleed.  Bright red blood per rectum. EXAM: NUCLEAR MEDICINE GASTROINTESTINAL BLEEDING SCAN TECHNIQUE: Sequential abdominal images were obtained following intravenous administration of Tc-41m labeled red blood cells. RADIOPHARMACEUTICALS:  24.2 mCi Tc-83m in-vitro labeled red cells. COMPARISON:  None. FINDINGS: There are no areas of radiopharmaceutical extravasation to localize the GI bleed. Normal vascular and soft tissue activity. IMPRESSION: No enteric extravasation of contrast to localize GI bleed. Electronically Signed   By: Rolm Baptise M.D.   On:  05/17/2016 16:54    Past Medical History  Diagnosis Date  . Meningitis   . Macular degeneration   . Hypertension   . GERD (gastroesophageal reflux disease)   . Hyperlipidemia   . Elevated hemoglobin A1c   . Cataract   . Cancer [facility]) 1979 mastectomy  . Glaucoma     Right pupil (about 53mm) is larger than left pupil (about 45mm) due to surgery in 1973.  Amblyopia in right eye.     Allergies  Allergen Reactions  . Lactose Intolerance (Gi) Other (See Comments)    unknown      Current Outpatient Prescriptions on File Prior to Visit  Medication Sig Dispense Refill  . acetaminophen (TYLENOL) 500 MG tablet Take 1,000 mg by mouth every 8 (eight) hours as needed for moderate pain.    Marland Kitchen atenolol (TENORMIN) 100 MG tablet Take 1 tablet (100 mg total) by mouth daily. 90 tablet 1  . B Complex-C (B-COMPLEX WITH VITAMIN C) tablet Take 1 tablet by mouth daily.      . bumetanide (BUMEX) 2 MG tablet TAKE 1 TABLET BY MOUTH  DAILY FOR BLOOD PRESSURE  AND FLUID 90 tablet 99  . Cholecalciferol (VITAMIN D3) 2000 units TABS Take 8,000 Units by mouth daily.    . cholestyramine (QUESTRAN) 4 g packet Take 1 packet by mouth 2 (two) times daily. 60 each 11  . dorzolamide-timolol (COSOPT) 22.3-6.8 MG/ML ophthalmic solution Place 1 drop into the left eye 2 (two) times daily.   0  . DUREZOL 0.05 % EMUL Place 1 drop into the right eye 2 (two) times daily.     . isosorbide mononitrate (IMDUR)  30 MG 24 hr tablet Take 1 tablet by mouth  every night at bedtime 15 tablet 0  . latanoprost (XALATAN) 0.005 % ophthalmic solution Place 1 drop into the left eye at bedtime.     Marland Kitchen losartan (COZAAR) 100 MG tablet Take one-half tablet by  mouth twice a day for blood pressure 90 tablet 99  . Pancrelipase, Lip-Prot-Amyl, 6000 units CPEP Take 1 capsule (6,000 Units total) by mouth 3 (three) times daily before meals. 450 capsule 1  . Zinc 50 MG TABS Take 50 mg by mouth daily.      No current facility-administered medications on  file prior to visit.    Review of Systems  Constitutional: Negative for fever, chills and malaise/fatigue.  HENT: Positive for congestion. Negative for ear pain and sore throat.   Respiratory: Positive for cough and sputum production. Negative for shortness of breath and wheezing.   Cardiovascular: Negative for chest pain, palpitations and leg swelling.  Gastrointestinal: Positive for constipation. Negative for heartburn, abdominal pain, diarrhea, blood in stool and melena.  Genitourinary: Negative.   Neurological: Positive for headaches.  Psychiatric/Behavioral: Negative for depression. The patient is not nervous/anxious and does not have insomnia.      Physical Exam: Filed Weights   06/05/16 1555  Weight: 123 lb (55.792 kg)   BP 102/60 mmHg  Pulse 88  Temp(Src) 98 F (36.7 C) (Temporal)  Resp 16  Ht 5' 2.5" (1.588 m)  Wt 123 lb (55.792 kg)  BMI 22.12 kg/m2  SpO2 95% General Appearance: Well nourished, in no apparent distress. Eyes: PERRLA, EOMs, conjunctiva no swelling or erythema Sinuses: No Frontal/maxillary tenderness ENT/Mouth: Ext aud canals clear, TMs without erythema, bulging. No erythema, swelling, or exudate on post pharynx.  Tonsils not swollen or erythematous. Hearing normal.  Neck: Supple, thyroid normal.  Respiratory: Respiratory effort normal, BS equal bilaterally without rhonchi or wheezing or stridor. Possible soft rales in RLL.   Cardio: RRR with no  2/6 murmur No RGs. Brisk peripheral pulses without edema.  Abdomen: Soft, + BS.  Non tender, no guarding, rebound, masses.  Left sided ventral hernia present.  Soft and non-tender.  Easily reducible.  Mildly tender to palpation in the left lower quadrant.   Lymphatics: Non tender without lymphadenopathy.  Musculoskeletal: Full ROM, 5/5 strength, normal gait.  Skin: Warm, dry without rashes, lesions, ecchymosis.  Neuro: Cranial nerves intact. Normal muscle tone, no cerebellar symptoms. Sensation intact.  Psych:  Awake and oriented X 3, normal affect, Insight and Judgment appropriate.     Starlyn Skeans, PA-C 4:17 PM [facility]

## 2016-06-06 ENCOUNTER — Other Ambulatory Visit: Payer: Self-pay | Admitting: Internal Medicine

## 2016-06-06 ENCOUNTER — Telehealth: Payer: Self-pay

## 2016-06-06 MED ORDER — FLUTICASONE PROPIONATE 50 MCG/ACT NA SUSP: 2.0000 | Freq: Every day | NASAL | Status: AC

## 2016-06-06 MED ORDER — LORATADINE 10 MG PO TABS: 10.0000 mg | ORAL_TABLET | Freq: Every day | ORAL | Status: AC

## 2016-06-06 MED ORDER — DOXYCYCLINE HYCLATE 100 MG PO CAPS: 100.0000 mg | ORAL_CAPSULE | Freq: Two times a day (BID) | ORAL | Status: DC

## 2016-06-06 MED ORDER — FLUTICASONE FUROATE-VILANTEROL 100-25 MCG/INH IN AEPB: 1.0000 | INHALATION_SPRAY | Freq: Every day | RESPIRATORY_TRACT | Status: AC

## 2016-06-06 MED ORDER — POLYETHYLENE GLYCOL 3350 17 G PO PACK: 17.0000 g | PACK | Freq: Every day | ORAL | Status: AC | PRN

## 2016-06-06 NOTE — Telephone Encounter (Signed)
Spoke with [facility] he states pt has hemorrhoids w/ pain & spotting blood. Please Advise.

## 2016-06-18 ENCOUNTER — Encounter: Payer: Self-pay | Admitting: *Deleted

## 2016-06-19 ENCOUNTER — Encounter (HOSPITAL_COMMUNITY): Payer: Self-pay | Admitting: Emergency Medicine

## 2016-06-19 ENCOUNTER — Emergency Department (HOSPITAL_COMMUNITY): Payer: Medicare Other

## 2016-06-19 ENCOUNTER — Inpatient Hospital Stay (HOSPITAL_COMMUNITY): Payer: Medicare Other

## 2016-06-19 ENCOUNTER — Inpatient Hospital Stay (HOSPITAL_COMMUNITY)
Admit: 2016-06-19 | Discharge: 2016-06-19 | Disposition: A | Discharge status: Home / Self Care | Payer: Medicare Other | Attending: Pulmonary Disease | Admitting: Pulmonary Disease

## 2016-06-19 ENCOUNTER — Inpatient Hospital Stay (HOSPITAL_COMMUNITY)
Admission: EM | Admit: 2016-06-19 | Discharge: 2016-06-27 | DRG: 175 | Disposition: E | Payer: Medicare Other | Attending: Internal Medicine | Admitting: Internal Medicine

## 2016-06-19 DIAGNOSIS — I959 Hypotension, unspecified: Secondary | ICD-10-CM | POA: Diagnosis present

## 2016-06-19 DIAGNOSIS — H3530 Unspecified macular degeneration: Secondary | ICD-10-CM | POA: Diagnosis present

## 2016-06-19 DIAGNOSIS — R609 Edema, unspecified: Secondary | ICD-10-CM

## 2016-06-19 DIAGNOSIS — Z853 Personal history of malignant neoplasm of breast: Secondary | ICD-10-CM

## 2016-06-19 DIAGNOSIS — Z8249 Family history of ischemic heart disease and other diseases of the circulatory system: Secondary | ICD-10-CM

## 2016-06-19 DIAGNOSIS — I82491 Acute embolism and thrombosis of other specified deep vein of right lower extremity: Secondary | ICD-10-CM | POA: Diagnosis not present

## 2016-06-19 DIAGNOSIS — Z85828 Personal history of other malignant neoplasm of skin: Secondary | ICD-10-CM

## 2016-06-19 DIAGNOSIS — Z823 Family history of stroke: Secondary | ICD-10-CM

## 2016-06-19 DIAGNOSIS — E739 Lactose intolerance, unspecified: Secondary | ICD-10-CM | POA: Diagnosis present

## 2016-06-19 DIAGNOSIS — R0600 Dyspnea, unspecified: Secondary | ICD-10-CM | POA: Diagnosis not present

## 2016-06-19 DIAGNOSIS — Z66 Do not resuscitate: Secondary | ICD-10-CM | POA: Diagnosis present

## 2016-06-19 DIAGNOSIS — I2510 Atherosclerotic heart disease of native coronary artery without angina pectoris: Secondary | ICD-10-CM | POA: Diagnosis present

## 2016-06-19 DIAGNOSIS — K5731 Diverticulosis of large intestine without perforation or abscess with bleeding: Secondary | ICD-10-CM | POA: Diagnosis not present

## 2016-06-19 DIAGNOSIS — J9601 Acute respiratory failure with hypoxia: Secondary | ICD-10-CM | POA: Diagnosis present

## 2016-06-19 DIAGNOSIS — I480 Paroxysmal atrial fibrillation: Secondary | ICD-10-CM | POA: Diagnosis present

## 2016-06-19 DIAGNOSIS — F0150 Vascular dementia without behavioral disturbance: Secondary | ICD-10-CM | POA: Diagnosis present

## 2016-06-19 DIAGNOSIS — Z955 Presence of coronary angioplasty implant and graft: Secondary | ICD-10-CM

## 2016-06-19 DIAGNOSIS — R319 Hematuria, unspecified: Secondary | ICD-10-CM | POA: Diagnosis present

## 2016-06-19 DIAGNOSIS — I10 Essential (primary) hypertension: Secondary | ICD-10-CM | POA: Diagnosis present

## 2016-06-19 DIAGNOSIS — J90 Pleural effusion, not elsewhere classified: Secondary | ICD-10-CM | POA: Diagnosis not present

## 2016-06-19 DIAGNOSIS — Z79899 Other long term (current) drug therapy: Secondary | ICD-10-CM

## 2016-06-19 DIAGNOSIS — I82411 Acute embolism and thrombosis of right femoral vein: Secondary | ICD-10-CM | POA: Diagnosis present

## 2016-06-19 DIAGNOSIS — L8990 Pressure ulcer of unspecified site, unspecified stage: Secondary | ICD-10-CM | POA: Insufficient documentation

## 2016-06-19 DIAGNOSIS — K219 Gastro-esophageal reflux disease without esophagitis: Secondary | ICD-10-CM | POA: Diagnosis present

## 2016-06-19 DIAGNOSIS — L89899 Pressure ulcer of other site, unspecified stage: Secondary | ICD-10-CM | POA: Diagnosis present

## 2016-06-19 DIAGNOSIS — I2699 Other pulmonary embolism without acute cor pulmonale: Principal | ICD-10-CM | POA: Diagnosis present

## 2016-06-19 DIAGNOSIS — K922 Gastrointestinal hemorrhage, unspecified: Secondary | ICD-10-CM | POA: Diagnosis present

## 2016-06-19 DIAGNOSIS — I4891 Unspecified atrial fibrillation: Secondary | ICD-10-CM | POA: Diagnosis present

## 2016-06-19 DIAGNOSIS — Z7951 Long term (current) use of inhaled steroids: Secondary | ICD-10-CM

## 2016-06-19 DIAGNOSIS — Z789 Other specified health status: Secondary | ICD-10-CM | POA: Diagnosis not present

## 2016-06-19 DIAGNOSIS — Z9012 Acquired absence of left breast and nipple: Secondary | ICD-10-CM | POA: Diagnosis not present

## 2016-06-19 DIAGNOSIS — R000 Tachycardia, unspecified: Secondary | ICD-10-CM | POA: Diagnosis present

## 2016-06-19 DIAGNOSIS — Z8661 Personal history of infections of the central nervous system: Secondary | ICD-10-CM

## 2016-06-19 DIAGNOSIS — E876 Hypokalemia: Secondary | ICD-10-CM | POA: Diagnosis present

## 2016-06-19 DIAGNOSIS — R0602 Shortness of breath: Secondary | ICD-10-CM | POA: Diagnosis present

## 2016-06-19 DIAGNOSIS — H409 Unspecified glaucoma: Secondary | ICD-10-CM | POA: Diagnosis present

## 2016-06-19 DIAGNOSIS — R339 Retention of urine, unspecified: Secondary | ICD-10-CM | POA: Diagnosis present

## 2016-06-19 DIAGNOSIS — I82401 Acute embolism and thrombosis of unspecified deep veins of right lower extremity: Secondary | ICD-10-CM

## 2016-06-19 DIAGNOSIS — E785 Hyperlipidemia, unspecified: Secondary | ICD-10-CM | POA: Diagnosis present

## 2016-06-19 DIAGNOSIS — R627 Adult failure to thrive: Secondary | ICD-10-CM | POA: Diagnosis present

## 2016-06-19 DIAGNOSIS — Z515 Encounter for palliative care: Secondary | ICD-10-CM | POA: Diagnosis present

## 2016-06-19 DIAGNOSIS — Z833 Family history of diabetes mellitus: Secondary | ICD-10-CM

## 2016-06-19 LAB — CBC WITH DIFFERENTIAL/PLATELET
BASOS PCT: 0 %
Basophils Absolute: 0 10*3/uL (ref 0.0–0.1)
EOS ABS: 0.1 10*3/uL (ref 0.0–0.7)
Eosinophils Relative: 1 %
HCT: 33 % — ABNORMAL LOW (ref 36.0–46.0)
HEMOGLOBIN: 10.5 g/dL — AB (ref 12.0–15.0)
LYMPHS ABS: 1.1 10*3/uL (ref 0.7–4.0)
LYMPHS PCT: 13 %
MCH: 27.8 pg (ref 26.0–34.0)
MCHC: 31.8 g/dL (ref 30.0–36.0)
MCV: 87.3 fL (ref 78.0–100.0)
Monocytes Absolute: 0.5 10*3/uL (ref 0.1–1.0)
Monocytes Relative: 6 %
NEUTROS ABS: 7.1 10*3/uL (ref 1.7–7.7)
Neutrophils Relative %: 80 %
Platelets: 281 10*3/uL (ref 150–400)
RBC: 3.78 MIL/uL — ABNORMAL LOW (ref 3.87–5.11)
RDW: 15.5 % (ref 11.5–15.5)
WBC: 8.8 10*3/uL (ref 4.0–10.5)

## 2016-06-19 LAB — I-STAT CHEM 8, ED
BUN: 15 mg/dL (ref 6–20)
CHLORIDE: 106 mmol/L (ref 101–111)
CREATININE: 0.8 mg/dL (ref 0.44–1.00)
Calcium, Ion: 1.21 mmol/L (ref 1.12–1.23)
GLUCOSE: 146 mg/dL — AB (ref 65–99)
HCT: 34 % — ABNORMAL LOW (ref 36.0–46.0)
Hemoglobin: 11.6 g/dL — ABNORMAL LOW (ref 12.0–15.0)
POTASSIUM: 3 mmol/L — AB (ref 3.5–5.1)
Sodium: 143 mmol/L (ref 135–145)
TCO2: 23 mmol/L (ref 0–100)

## 2016-06-19 LAB — ECHOCARDIOGRAM COMPLETE
Height: 63 in
WEIGHTICAEL: 2028.23 [oz_av]

## 2016-06-19 LAB — PROTIME-INR
INR: 1.17 (ref 0.00–1.49)
INR: 1.2 (ref 0.00–1.49)
Prothrombin Time: 15.1 seconds (ref 11.6–15.2)
Prothrombin Time: 15.4 seconds — ABNORMAL HIGH (ref 11.6–15.2)

## 2016-06-19 LAB — TROPONIN I
TROPONIN I: 0.06 ng/mL — AB (ref <0.03)
TROPONIN I: 0.06 ng/mL — AB (ref <0.03)

## 2016-06-19 LAB — APTT: APTT: 29 s (ref 24–37)

## 2016-06-19 LAB — BLOOD GAS, ARTERIAL
Acid-base deficit: 3.9 mmol/L — ABNORMAL HIGH (ref 0.0–2.0)
BICARBONATE: 19.4 meq/L — AB (ref 20.0–24.0)
FIO2: 1
O2 SAT: 96.6 %
PCO2 ART: 30.7 mmHg — AB (ref 35.0–45.0)
PH ART: 7.417 (ref 7.350–7.450)
PO2 ART: 95.8 mmHg (ref 80.0–100.0)
Patient temperature: 98.6
TCO2: 18 mmol/L (ref 0–100)

## 2016-06-19 LAB — HEPARIN LEVEL (UNFRACTIONATED): HEPARIN UNFRACTIONATED: 0.2 [IU]/mL — AB (ref 0.30–0.70)

## 2016-06-19 LAB — I-STAT TROPONIN, ED: TROPONIN I, POC: 0.02 ng/mL (ref 0.00–0.08)

## 2016-06-19 LAB — D-DIMER, QUANTITATIVE (NOT AT ARMC): D DIMER QUANT: 10.64 ug{FEU}/mL — AB (ref 0.00–0.50)

## 2016-06-19 LAB — BRAIN NATRIURETIC PEPTIDE: B NATRIURETIC PEPTIDE 5: 474.6 pg/mL — AB (ref 0.0–100.0)

## 2016-06-19 LAB — POC OCCULT BLOOD, ED: FECAL OCCULT BLD: NEGATIVE

## 2016-06-19 LAB — MRSA PCR SCREENING: MRSA by PCR: NEGATIVE

## 2016-06-19 MED ORDER — METOPROLOL TARTRATE 5 MG/5ML IV SOLN
2.5000 mg | INTRAVENOUS | Status: DC | PRN
  Administered 2016-06-19: 2.5 mg via INTRAVENOUS
  Filled 2016-06-19: qty 5

## 2016-06-19 MED ORDER — ACETAMINOPHEN 325 MG PO TABS
650.0000 mg | ORAL_TABLET | Freq: Four times a day (QID) | ORAL | Status: DC | PRN
  Filled 2016-06-19: qty 2

## 2016-06-19 MED ORDER — HEPARIN (PORCINE) IN NACL 100-0.45 UNIT/ML-% IJ SOLN
650.0000 [IU]/h | INTRAMUSCULAR | Status: DC
  Administered 2016-06-19: 650 [IU]/h via INTRAVENOUS
  Filled 2016-06-19: qty 250

## 2016-06-19 MED ORDER — SODIUM CHLORIDE 0.9% FLUSH
3.0000 mL | Freq: Two times a day (BID) | INTRAVENOUS | Status: DC
  Administered 2016-06-19 – 2016-06-25 (×11): 3 mL via INTRAVENOUS

## 2016-06-19 MED ORDER — HEPARIN BOLUS VIA INFUSION
1350.0000 [IU] | Freq: Once | INTRAVENOUS | Status: AC
  Administered 2016-06-19: 1350 [IU] via INTRAVENOUS
  Filled 2016-06-19: qty 1350

## 2016-06-19 MED ORDER — DEXTROSE 5 % IV SOLN
5.0000 mg/h | INTRAVENOUS | Status: DC
  Administered 2016-06-19: 5 mg/h via INTRAVENOUS
  Administered 2016-06-20 – 2016-06-21 (×4): 10 mg/h via INTRAVENOUS
  Administered 2016-06-22: 12.5 mg/h via INTRAVENOUS
  Administered 2016-06-22: 10 mg/h via INTRAVENOUS
  Administered 2016-06-22 – 2016-06-23 (×2): 12.5 mg/h via INTRAVENOUS
  Administered 2016-06-23: 15 mg/h via INTRAVENOUS
  Filled 2016-06-19 (×14): qty 100

## 2016-06-19 MED ORDER — FAMOTIDINE IN NACL 20-0.9 MG/50ML-% IV SOLN
20.0000 mg | Freq: Once | INTRAVENOUS | Status: AC
  Administered 2016-06-19: 20 mg via INTRAVENOUS
  Filled 2016-06-19: qty 50

## 2016-06-19 MED ORDER — FLUTICASONE FUROATE-VILANTEROL 100-25 MCG/INH IN AEPB
1.0000 | INHALATION_SPRAY | Freq: Every day | RESPIRATORY_TRACT | Status: DC
  Administered 2016-06-20 – 2016-06-23 (×4): 1 via RESPIRATORY_TRACT
  Filled 2016-06-19: qty 28

## 2016-06-19 MED ORDER — ALBUTEROL SULFATE (2.5 MG/3ML) 0.083% IN NEBU: 5.0000 mg | INHALATION_SOLUTION | Freq: Once | RESPIRATORY_TRACT | Status: DC

## 2016-06-19 MED ORDER — DILTIAZEM LOAD VIA INFUSION
10.0000 mg | Freq: Once | INTRAVENOUS | Status: AC
  Administered 2016-06-19: 10 mg via INTRAVENOUS
  Filled 2016-06-19: qty 10

## 2016-06-19 MED ORDER — HEPARIN BOLUS VIA INFUSION
800.0000 [IU] | Freq: Once | INTRAVENOUS | Status: AC
  Administered 2016-06-19: 800 [IU] via INTRAVENOUS
  Filled 2016-06-19: qty 800

## 2016-06-19 MED ORDER — ONDANSETRON HCL 4 MG/2ML IJ SOLN: 4.0000 mg | Freq: Four times a day (QID) | INTRAMUSCULAR | Status: DC | PRN

## 2016-06-19 MED ORDER — IOPAMIDOL (ISOVUE-370) INJECTION 76%
100.0000 mL | Freq: Once | INTRAVENOUS | Status: AC | PRN
  Administered 2016-06-19: 100 mL via INTRAVENOUS

## 2016-06-19 MED ORDER — DILTIAZEM HCL 25 MG/5ML IV SOLN
10.0000 mg | Freq: Once | INTRAVENOUS | Status: AC
  Administered 2016-06-19: 10 mg via INTRAVENOUS
  Filled 2016-06-19: qty 5

## 2016-06-19 MED ORDER — PANTOPRAZOLE SODIUM 40 MG PO TBEC
40.0000 mg | DELAYED_RELEASE_TABLET | Freq: Every day | ORAL | Status: DC
  Administered 2016-06-19 – 2016-06-24 (×6): 40 mg via ORAL
  Filled 2016-06-19 (×6): qty 1

## 2016-06-19 MED ORDER — POTASSIUM CHLORIDE CRYS ER 20 MEQ PO TBCR
40.0000 meq | EXTENDED_RELEASE_TABLET | Freq: Once | ORAL | Status: AC
  Administered 2016-06-19: 40 meq via ORAL
  Filled 2016-06-19: qty 2

## 2016-06-19 MED ORDER — MORPHINE SULFATE (PF) 2 MG/ML IV SOLN
1.0000 mg | INTRAVENOUS | Status: DC | PRN
  Administered 2016-06-20 – 2016-06-25 (×10): 1 mg via INTRAVENOUS
  Filled 2016-06-19 (×10): qty 1

## 2016-06-19 MED ORDER — SODIUM CHLORIDE 0.9 % IV BOLUS (SEPSIS)
500.0000 mL | Freq: Once | INTRAVENOUS | Status: AC
  Administered 2016-06-19: 500 mL via INTRAVENOUS

## 2016-06-19 MED ORDER — ONDANSETRON HCL 4 MG PO TABS: 4.0000 mg | ORAL_TABLET | Freq: Four times a day (QID) | ORAL | Status: DC | PRN

## 2016-06-19 MED ORDER — ACETAMINOPHEN 650 MG RE SUPP: 650.0000 mg | Freq: Four times a day (QID) | RECTAL | Status: DC | PRN

## 2016-06-19 MED ORDER — SODIUM CHLORIDE 0.9 % IV SOLN
INTRAVENOUS | Status: DC
  Administered 2016-06-20: 10 mL via INTRAVENOUS

## 2016-06-19 MED ORDER — HYDROCODONE-ACETAMINOPHEN 5-325 MG PO TABS: 1.0000 | ORAL_TABLET | ORAL | Status: DC | PRN

## 2016-06-19 MED ORDER — HEPARIN (PORCINE) IN NACL 100-0.45 UNIT/ML-% IJ SOLN
750.0000 [IU]/h | INTRAMUSCULAR | Status: DC
  Filled 2016-06-19: qty 250

## 2016-06-19 NOTE — ED Notes (Signed)
Patient transported to X-ray 

## 2016-06-19 NOTE — ED Triage Notes (Signed)
Per EMS, patient complaining of shortness of breath starting this morning. Patient breathing 36-40 times per minute. Lung sounds clear per EMS. Patient placed on 4 L [place] to help with shortness of breath. Last night was the last night her sitter was going to be sitting with her and patient has been upset about that. Patient is complaining of left leg pain due to a previous injury. Patient is from [facility]. Hx. A. Fib.

## 2016-06-19 NOTE — ED Notes (Signed)
Per Kandis Mannan, MD, patient is to receive 500 mL Ns bolus prior to cardizem administration

## 2016-06-19 NOTE — Consult Note (Signed)
Name: [name] MRN: [id number] DOB: 08/06/1922    ADMISSION DATE:  06/20/2016 CONSULTATION DATE:  06/08/2016  REFERRING MD :  Dr. Winfred Leeds  CHIEF COMPLAINT:  SOB / PE   HISTORY OF PRESENT ILLNESS:  82 y/o F with PMH of left breast cancer (1979) s/p mastectomy, GERD, HTN, HLD, PAF (not on anticoagulation), CAD s/p stent, glaucoma & macular degeneration who presented to [facility] on 7/24 with complaints of shortness of breath.  Of note, she was recently admitted from 6/20 - 6/25 for lower GIB thought related to diverticulosis.  Colonoscopy was preformed that showed moderate diverticulosis and a few polyps.    The patient is a resident of [facility].  Her primary decision maker is her granddaughter.  The patient reports she had an episode of shortness of breath two weeks PTA when walking to the restroom.  It was relieved by sitting down and resting in a chair.  In questioning, she is somewhat repetitive with stories.  EMS was activated for a second episode of shortness of breath on 7/24 that did not resolve with rest.  She was found to be in atrial fibrillation with RVR by EMS.  Her initial BP was 86/69 and saturations in the 80's.  O2 applied with improvement and she was treated with 568ml NS rise in BP.  The patient denies shortness of breath currently, chest pain, pain with deep inspiration, pre-syncope / syncope, nausea / vomiting and diarrhea.   D-Dimer elevated at 10.64.  Initial labs - Na 143, K 3.0, Cl 106, Sr Cr 0.80, troponin 0.02, BNP 474, WBC 8.8, Hgb 10.5 and platelets 281.  Follow up CTA of the chest was evaluated which showed acute PE with evidence of right heart strain with RV/LV ratio of 1.7, extensive PE bilaterally involving both main pulmonary arteries as well as peripheral branches.   PCCM called for ICU admission.   PAST MEDICAL HISTORY :   has a past medical history of Cancer ([place]) (1979 mastectomy); Cataract; Elevated hemoglobin A1c; GERD (gastroesophageal reflux  disease); Glaucoma; Hyperlipidemia; Hypertension; Macular degeneration; and Meningitis.  has a past surgical history that includes Abdominal hysterectomy; Mastectomy; Coronary stent placement; Colon resection; Eye surgery (1973); Breast surgery (Left, 1979); Skin cancer excision (2008 ); and Colonoscopy (N/A, 05/19/2016).   Prior to Admission medications   Medication Sig Start Date End Date Taking? Authorizing Provider  acetaminophen (TYLENOL) 500 MG tablet Take 1,000 mg by mouth every 8 (eight) hours as needed for moderate pain.   Yes Historical Provider, MD  atenolol (TENORMIN) 100 MG tablet Take 1 tablet (100 mg total) by mouth daily. 03/21/16  Yes Unk Pinto, MD  B Complex-C (B-COMPLEX WITH VITAMIN C) tablet Take 1 tablet by mouth daily.     Yes Historical Provider, MD  bumetanide (BUMEX) 2 MG tablet TAKE 1 TABLET BY MOUTH  DAILY FOR BLOOD PRESSURE  AND FLUID 05/29/15  Yes Unk Pinto, MD  Cholecalciferol (VITAMIN D3) 2000 units TABS Take 8,000 Units by mouth daily.   Yes Historical Provider, MD  cholestyramine (QUESTRAN) 4 g packet Take 1 packet by mouth 2 (two) times daily. 01/20/16 01/19/17 Yes Courtney Forcucci, PA-C  dorzolamide-timolol (COSOPT) 22.3-6.8 MG/ML ophthalmic solution Place 1 drop into the left eye 2 (two) times daily.  12/03/14  Yes Historical Provider, MD  DUREZOL 0.05 % EMUL Place 1 drop into the right eye 2 (two) times daily.  12/16/15  Yes Historical Provider, MD  fluticasone (FLONASE) 50 MCG/ACT nasal spray Place 2 sprays  into both nostrils daily. 06/06/16  Yes Courtney Forcucci, PA-C  fluticasone furoate-vilanterol (BREO ELLIPTA) 100-25 MCG/INH AEPB Inhale 1 puff into the lungs daily. Rinse mouth after use.  Use until acute upper respiratory infection resolves. 06/06/16  Yes Courtney Forcucci, PA-C  isosorbide mononitrate (IMDUR) 30 MG 24 hr tablet Take 1 tablet by mouth  every night at bedtime 06/01/15  Yes Unk Pinto, MD  latanoprost (XALATAN) 0.005 % ophthalmic  solution Place 1 drop into the left eye at bedtime.    Yes Historical Provider, MD  loratadine (CLARITIN) 10 MG tablet Take 1 tablet (10 mg total) by mouth daily. 06/06/16 06/06/17 Yes Courtney Forcucci, PA-C  losartan (COZAAR) 100 MG tablet Take one-half tablet by  mouth twice a day for blood pressure 05/29/15  Yes Unk Pinto, MD  Pancrelipase, Lip-Prot-Amyl, 6000 units CPEP Take 1 capsule (6,000 Units total) by mouth 3 (three) times daily before meals. 01/25/16  Yes Courtney Forcucci, PA-C  polyethylene glycol (MIRALAX / GLYCOLAX) packet Take 17 g by mouth daily as needed for mild constipation (take 1 packet per day until a bowel movement is acheived.). Patient taking differently: Take 17 g by mouth daily.  06/06/16  Yes Courtney Forcucci, PA-C  Zinc 50 MG TABS Take 50 mg by mouth daily.    Yes Historical Provider, MD   Allergies  Allergen Reactions  . Lactose Intolerance (Gi) Other (See Comments)    unknown    FAMILY HISTORY:  family history includes Arthritis in her sister; CVA in her father; Diabetes in her daughter; Heart attack in her brother; Heart disease in her brother; Hypertension in her daughter, father, and mother.   SOCIAL HISTORY:  reports that she has never smoked. She has never used smokeless tobacco. She reports that she does not drink alcohol or use drugs.  REVIEW OF SYSTEMS:  POSITIVES IN BOLD Constitutional: Negative for fever, chills, weight loss, malaise/fatigue and diaphoresis.  HENT: Negative for hearing loss, ear pain, nosebleeds, congestion, sore throat, neck pain, tinnitus and ear discharge.   Eyes: Negative for blurred vision, double vision, photophobia, pain, discharge and redness.  Respiratory: Negative for cough, hemoptysis, sputum production, shortness of breath, wheezing and stridor.   Cardiovascular: Negative for chest pain, palpitations, orthopnea, claudication, leg swelling and PND.  Gastrointestinal: Negative for heartburn, nausea, vomiting, abdominal  pain, diarrhea, constipation, blood in stool and melena.  Genitourinary: Negative for dysuria, urgency, frequency, hematuria and flank pain.  Musculoskeletal: Negative for myalgias, back pain, joint pain and falls.  Skin: Negative for itching and rash.  Neurological: Negative for dizziness, tingling, tremors, sensory change, speech change, focal weakness, seizures, loss of consciousness, weakness and headaches.  Endo/Heme/Allergies: Negative for environmental allergies and polydipsia. Does not bruise/bleed easily.  SUBJECTIVE:   VITAL SIGNS: Temp:  [97.4 F (36.3 C)] 97.4 F (36.3 C) (07/24 0857) Pulse Rate:  [78-145] 111 (07/24 1130) Resp:  [19-35] 35 (07/24 1200) BP: (86-124)/(63-101) 118/84 (07/24 1200) SpO2:  [85 %-100 %] 94 % (07/24 1130) Weight:  [123 lb (55.8 kg)] 123 lb (55.8 kg) (07/24 0852)  PHYSICAL EXAMINATION: General:  Well developed elderly female in NAD  Neuro:  Awake, alert, oriented, somewhat repetitive in conversation, MAE, R pupil irregular 31mm (prior surgery), L 2-3 mm HEENT:  Mm pink/moist, no jvd  Cardiovascular:  s1s2 irr irr, AF on monitor, no m/r/g  Lungs:  Even/non-labored, lungs bilaterally clear, good air movement  Abdomen:  Obese/soft, bsx4 active  Musculoskeletal:  No acute deformities  Skin:  Warm/dry, trace BLE edema  R>L   Recent Labs Lab 06/24/2016 0930  NA 143  K 3.0*  CL 106  BUN 15  CREATININE 0.80  GLUCOSE 146*    Recent Labs Lab 05/29/2016 0924 05/29/2016 0930  HGB 10.5* 11.6*  HCT 33.0* 34.0*  WBC 8.8  --   PLT 281  --    Dg Chest 2 View  Result Date: 06/15/2016 CLINICAL DATA:  Shortness of Breath EXAM: CHEST  2 VIEW COMPARISON:  June 10, 2015 FINDINGS: There is left lower lobe airspace consolidation with small left effusion. Lungs elsewhere clear. There is cardiomegaly with evidence a degree of pulmonary venous hypertension. There is atherosclerotic calcification in the aorta. Patient is status post left mastectomy with surgical  clips in left axilla. No adenopathy. No bone lesions. IMPRESSION: Left lower lobe airspace consolidation with small left pleural effusion. Lungs elsewhere clear. Evidence of pulmonary vascular congestion. There is aortic atherosclerosis. Electronically Signed   By: Lowella Grip III M.D.   On: 06/25/2016 09:19  Ct Angio Chest Pe W Or Wo Contrast  Result Date: 06/18/2016 CLINICAL DATA:  Shortness of Breath. History of left-sided breast carcinoma EXAM: CT ANGIOGRAPHY CHEST WITH CONTRAST TECHNIQUE: Multidetector CT imaging of the chest was performed using the standard protocol during bolus administration of intravenous contrast. Multiplanar CT image reconstructions and MIPs were obtained to evaluate the vascular anatomy. CONTRAST:  100 mL Isovue 370 nonionic COMPARISON:  Chest radiograph June 19, 2016 FINDINGS: Cardiovascular: There is extensive pulmonary embolus arising from the distal main pulmonary arteries bilaterally, extensive bilaterally,with extension into multiple upper and lower lobe pulmonary arterial branches, more pronounced on the right than on the left. The right ventricular to left ventricular diameter ratio is 1.7, consistent with right heart strain. The ascending thoracic aorta diameter measures 4.0 x 4.0 cm. There is no appreciable thoracic aortic dissection. There is atherosclerotic calcification throughout the aorta. The visualized great vessels appear normal except for mild scattered foci of atherosclerotic calcification. The right and left common carotid arteries arise as a common trunk, an anatomic variant. There is coronary artery calcification in the left anterior descending coronary artery. There is a small pericardial effusion. Mediastinum/Nodes: Thyroid appears diminutive. There is no appreciable thoracic adenopathy. Lungs/Pleura: There is a moderate pleural effusion on the left with left lower lobe consolidation. There is fatty prominence along the right posterior pleura with mild  pleural thickening posteriorly in this area. There is no edema or consolidation on the right. Upper Abdomen: Contrast is reflux into the inferior vena cava and hepatic veins. There is atherosclerotic calcification in the upper abdominal aorta. Visualized upper abdominal structures otherwise appear normal. Musculoskeletal: There is degenerative change in the thoracic spine. No blastic or lytic bone lesions. Patient is status post mastectomy on the left. There are surgical clips in left axillary region. Review of the MIP images confirms the above findings. IMPRESSION: Positive for acute PE with CT evidence of right heart strain (RV/LV Ratio = 1.7) consistent with at least submassive (intermediate risk) PE. The presence of right heart strain has been associated with an increased risk of morbidity and mortality. Please activate Code PE by paging [phone]. Pulmonary emboli are extensive bilaterally with extensive involving both main pulmonary arteries as well as multiple peripheral branches. Reflux of contrast into the inferior vena cava and hepatic veins is consistent with elevated right heart pressure. Prominence of the ascending thoracic aorta with a measured diameter 4.0 x 4.0 cm. Recommend annual imaging followup by CTA or MRA. This recommendation follows 2010  ACCF/AHA/AATS/ACR/ASA/SCA/SCAI/SIR/STS/SVM Guidelines for the Diagnosis and Management of Patients with Thoracic Aortic Disease. Circulation. 2010; [id number]. Moderate pleural effusion on the left with left lower lobe consolidation. Mild posterior pleural thickening on the right without edema or consolidation on the right. Small pericardial effusion. Multiple foci of coronary artery calcification. There is atherosclerotic calcification in the aorta. Status post left mastectomy. Critical Value/emergent results were called by telephone at the time of interpretation on 06/18/2016 at 11:33 am to Dr. Orlie Dakin , who verbally acknowledged these results.  Electronically Signed   By: Lowella Grip III M.D.   On: 06/17/2016 11:35  SIGNIFICANT EVENTS  7/24  Admit with SOB, work up positive for PE with RV/LV ratio of 1.7  STUDIES:  CTA Chest 7/24 >> acute PE with evidence of right heart strain with RV/LV ratio of 1.7, extensive PE bilaterally involving both main pulmonary arteries as well as peripheral branches  ASSESSMENT / PLAN:  Acute PE - submassive, RV/LV ratio of 1.7 on admit RLE Swelling, R/O DVT  Recent Lower GIB - in setting of diverticulosis   Plan: Admit per [facility] to SDU  Heparin gtt per pharmacy for PE > will have to cautiously monitor her on anticoagulation Not a candidate for TPA (not hemodynamically unstable to warrant) or EKOS intervention  O2 to keep sats > 92% Trend troponin  Assess ECHO  Assess LE Venous Doppler If mobile clot on venous duplex or she does not tolerate anticoagulation from a bleeding standpoint, she will need IVC filter placement Bedrest until heparin level therapeutic  DNR/DNI   PCCM will follow along with you.    Noe Gens, NP-C [facility] Pgr: [phone] or if no answer [phone] 06/22/2016, 12:08 PM

## 2016-06-19 NOTE — ED Notes (Signed)
MD at bedside. 

## 2016-06-19 NOTE — H&P (Signed)
History and Physical    [name] [id number] DOB: 1922-01-01 DOA: 06/14/2016  PCP: [name], MD  Patient coming from: [facility]  Chief Complaint: Shortness of breath  HPI: [name] is a 82 y.o. female with medical history significant of history of A. fib (not on anticoagulations), remote history of left-sided breast cancer, hypertension, GERD and recent lower GI bleed (discharge from the hospital about a month ago for GI bleed).  Patient sent from her SNF because of SOB. She recalled transient SOB over the past few days but this morning she had very severe shortness of breath so she was brought to the hospital for further evaluation she was tachycardic as well. Upon initial evaluation in the ED she was found to have A. fib with RVR, initial blood pressure was 86/69 so she was given 500 mL of normal saline, patient currently very hypoxic and she is on a nonrebreather mask satting in the low 90s. CT scan showed extensive PE bilaterally.  ED Course:  Vitals: Heart rate 132, BP 86/69 on admission, pressure improved to 118/84. Currently on NRBM Labs: D-dimer of 10.6 Imaging: CT scan showed extensive bilateral PE with evidence of right heart strain, negative troponin Interventions: 500 mL of normal saline given, started on heparin infusion  Review of Systems:  Constitutional: negative for anorexia, fevers and sweats Eyes: negative for irritation, redness and visual disturbance Ears, nose, mouth, throat, and face: negative for earaches, epistaxis, nasal congestion and sore throat Respiratory: SOB Cardiovascular: Palpitations Gastrointestinal: negative for abdominal pain, constipation, diarrhea, melena, nausea and vomiting Genitourinary:negative for dysuria, frequency and hematuria Hematologic/lymphatic: negative for bleeding, easy bruising and lymphadenopathy Musculoskeletal:negative for arthralgias, muscle weakness and stiff joints Neurological:  negative for coordination problems, gait problems, headaches and weakness Endocrine: negative for diabetic symptoms including polydipsia, polyuria and weight loss Allergic/Immunologic: negative for anaphylaxis, hay fever and urticaria  Past Medical History:  Diagnosis Date  . Cancer [facility]) 1979 mastectomy  . Cataract   . Elevated hemoglobin A1c   . GERD (gastroesophageal reflux disease)   . Glaucoma    Right pupil (about 21mm) is larger than left pupil (about 64mm) due to surgery in 1973.  Amblyopia in right eye.  Marland Kitchen Hyperlipidemia   . Hypertension   . Macular degeneration   . Meningitis     Past Surgical History:  Procedure Laterality Date  . ABDOMINAL HYSTERECTOMY    . BREAST SURGERY Left 1979   mastectomy  . COLON RESECTION    . COLONOSCOPY N/A 05/19/2016   Procedure: COLONOSCOPY;  Surgeon: Jerene Bears, MD;  Location: [facility];  Service: Gastroenterology;  Laterality: N/A;  . CORONARY STENT PLACEMENT    . EYE SURGERY  1973   Dr. Katy Fitch  . MASTECTOMY    . SKIN CANCER EXCISION  2008    Squamous cell - left thigh     reports that she has never smoked. She has never used smokeless tobacco. She reports that she does not drink alcohol or use drugs.  Allergies  Allergen Reactions  . Lactose Intolerance (Gi) Other (See Comments)    unknown    Family History  Problem Relation Age of Onset  . Hypertension Mother   . Hypertension Father   . CVA Father   . Arthritis Sister     Rheumatoid  . Heart attack Brother   . Heart disease Brother   . Diabetes Daughter   . Hypertension Daughter    Prior to Admission medications   Medication  Sig Start Date End Date Taking? Authorizing Provider  acetaminophen (TYLENOL) 500 MG tablet Take 1,000 mg by mouth every 8 (eight) hours as needed for moderate pain.   Yes Historical Provider, MD  atenolol (TENORMIN) 100 MG tablet Take 1 tablet (100 mg total) by mouth daily. 03/21/16  Yes Unk Pinto, MD  B Complex-C (B-COMPLEX WITH VITAMIN  C) tablet Take 1 tablet by mouth daily.     Yes Historical Provider, MD  bumetanide (BUMEX) 2 MG tablet TAKE 1 TABLET BY MOUTH  DAILY FOR BLOOD PRESSURE  AND FLUID 05/29/15  Yes Unk Pinto, MD  Cholecalciferol (VITAMIN D3) 2000 units TABS Take 8,000 Units by mouth daily.   Yes Historical Provider, MD  cholestyramine (QUESTRAN) 4 g packet Take 1 packet by mouth 2 (two) times daily. 01/20/16 01/19/17 Yes Courtney Forcucci, PA-C  dorzolamide-timolol (COSOPT) 22.3-6.8 MG/ML ophthalmic solution Place 1 drop into the left eye 2 (two) times daily.  12/03/14  Yes Historical Provider, MD  DUREZOL 0.05 % EMUL Place 1 drop into the right eye 2 (two) times daily.  12/16/15  Yes Historical Provider, MD  fluticasone (FLONASE) 50 MCG/ACT nasal spray Place 2 sprays into both nostrils daily. 06/06/16  Yes Courtney Forcucci, PA-C  fluticasone furoate-vilanterol (BREO ELLIPTA) 100-25 MCG/INH AEPB Inhale 1 puff into the lungs daily. Rinse mouth after use.  Use until acute upper respiratory infection resolves. 06/06/16  Yes Courtney Forcucci, PA-C  isosorbide mononitrate (IMDUR) 30 MG 24 hr tablet Take 1 tablet by mouth  every night at bedtime 06/01/15  Yes Unk Pinto, MD  latanoprost (XALATAN) 0.005 % ophthalmic solution Place 1 drop into the left eye at bedtime.    Yes Historical Provider, MD  loratadine (CLARITIN) 10 MG tablet Take 1 tablet (10 mg total) by mouth daily. 06/06/16 06/06/17 Yes Courtney Forcucci, PA-C  losartan (COZAAR) 100 MG tablet Take one-half tablet by  mouth twice a day for blood pressure 05/29/15  Yes Unk Pinto, MD  Pancrelipase, Lip-Prot-Amyl, 6000 units CPEP Take 1 capsule (6,000 Units total) by mouth 3 (three) times daily before meals. 01/25/16  Yes Courtney Forcucci, PA-C  polyethylene glycol (MIRALAX / GLYCOLAX) packet Take 17 g by mouth daily as needed for mild constipation (take 1 packet per day until a bowel movement is acheived.). Patient taking differently: Take 17 g by mouth daily.   06/06/16  Yes Courtney Forcucci, PA-C  Zinc 50 MG TABS Take 50 mg by mouth daily.    Yes Historical Provider, MD    Physical Exam:  Vitals:   05/28/2016 1122 06/03/2016 1130 06/11/2016 1135 06/21/2016 1200  BP:  116/69  118/84  Pulse: 110 111  113  Resp:  (!) 33 21 (!) 35  Temp:      TempSrc:      SpO2: 96% 94%  94%  Weight:      Height:        Constitutional: NAD, calm, comfortable Eyes: PERRL, lids and conjunctivae normal ENMT: Mucous membranes are moist. Posterior pharynx clear of any exudate or lesions.Normal dentition.  Neck: normal, supple, no masses, no thyromegaly Respiratory: clear to auscultation bilaterally, no wheezing, no crackles. Normal respiratory effort. No accessory muscle use.  Cardiovascular: Regular rate and rhythm, no murmurs / rubs / gallops. No extremity edema. 2+ pedal pulses. No carotid bruits.  Abdomen: no tenderness, no masses palpated. No hepatosplenomegaly. Bowel sounds positive.  Musculoskeletal: no clubbing / cyanosis. No joint deformity upper and lower extremities. Good ROM, no contractures. Normal muscle tone.  Skin:  no rashes, lesions, ulcers. No induration Neurologic: CN 2-12 grossly intact. Sensation intact, DTR normal. Strength 5/5 in all 4.  Psychiatric: Normal judgment and insight. Alert and oriented x 3. Normal mood.   Labs on Admission: I have personally reviewed following labs and imaging studies  CBC:  Recent Labs Lab 06/17/2016 0924 06/18/2016 0930  WBC 8.8  --   NEUTROABS 7.1  --   HGB 10.5* 11.6*  HCT 33.0* 34.0*  MCV 87.3  --   PLT 281  --    Basic Metabolic Panel:  Recent Labs Lab 06/13/2016 0930  NA 143  K 3.0*  CL 106  GLUCOSE 146*  BUN 15  CREATININE 0.80   GFR: Estimated Creatinine Clearance: 37.1 mL/min (by C-G formula based on SCr of 0.8 mg/dL). Liver Function Tests: No results for input(s): AST, ALT, ALKPHOS, BILITOT, PROT, ALBUMIN in the last 168 hours. No results for input(s): LIPASE, AMYLASE in the last 168  hours. No results for input(s): AMMONIA in the last 168 hours. Coagulation Profile: No results for input(s): INR, PROTIME in the last 168 hours. Cardiac Enzymes: No results for input(s): CKTOTAL, CKMB, CKMBINDEX, TROPONINI in the last 168 hours. BNP (last 3 results) No results for input(s): PROBNP in the last 8760 hours. HbA1C: No results for input(s): HGBA1C in the last 72 hours. CBG: No results for input(s): GLUCAP in the last 168 hours. Lipid Profile: No results for input(s): CHOL, HDL, LDLCALC, TRIG, CHOLHDL, LDLDIRECT in the last 72 hours. Thyroid Function Tests: No results for input(s): TSH, T4TOTAL, FREET4, T3FREE, THYROIDAB in the last 72 hours. Anemia Panel: No results for input(s): VITAMINB12, FOLATE, FERRITIN, TIBC, IRON, RETICCTPCT in the last 72 hours. Urine analysis:    Component Value Date/Time   COLORURINE YELLOW 12/20/2015 1103   APPEARANCEUR CLEAR 12/20/2015 1103   LABSPEC 1.007 12/20/2015 1103   PHURINE 6.0 12/20/2015 1103   GLUCOSEU NEGATIVE 12/20/2015 1103   HGBUR NEGATIVE 12/20/2015 1103   BILIRUBINUR NEGATIVE 12/20/2015 1103   KETONESUR NEGATIVE 12/20/2015 1103   PROTEINUR NEGATIVE 12/20/2015 1103   UROBILINOGEN 0.2 12/27/2014 1020   NITRITE NEGATIVE 12/20/2015 1103   LEUKOCYTESUR NEGATIVE 12/20/2015 1103   Sepsis Labs: !!!!!!!!!!!!!!!!!!!!!!!!!!!!!!!!!!!!!!!!!!!! Invalid input(s): PROCALCITONIN, LACTICIDVEN No results found for this or any previous visit (from the past 240 hour(s)).   Radiological Exams on Admission: Dg Chest 2 View  Result Date: 06/15/2016 CLINICAL DATA:  Shortness of Breath EXAM: CHEST  2 VIEW COMPARISON:  June 10, 2015 FINDINGS: There is left lower lobe airspace consolidation with small left effusion. Lungs elsewhere clear. There is cardiomegaly with evidence a degree of pulmonary venous hypertension. There is atherosclerotic calcification in the aorta. Patient is status post left mastectomy with surgical clips in left axilla. No  adenopathy. No bone lesions. IMPRESSION: Left lower lobe airspace consolidation with small left pleural effusion. Lungs elsewhere clear. Evidence of pulmonary vascular congestion. There is aortic atherosclerosis. Electronically Signed   By: Lowella Grip III M.D.   On: 05/28/2016 09:19  Ct Angio Chest Pe W Or Wo Contrast  Result Date: 06/05/2016 CLINICAL DATA:  Shortness of Breath. History of left-sided breast carcinoma EXAM: CT ANGIOGRAPHY CHEST WITH CONTRAST TECHNIQUE: Multidetector CT imaging of the chest was performed using the standard protocol during bolus administration of intravenous contrast. Multiplanar CT image reconstructions and MIPs were obtained to evaluate the vascular anatomy. CONTRAST:  100 mL Isovue 370 nonionic COMPARISON:  Chest radiograph June 19, 2016 FINDINGS: Cardiovascular: There is extensive pulmonary embolus arising from the distal main  pulmonary arteries bilaterally, extensive bilaterally,with extension into multiple upper and lower lobe pulmonary arterial branches, more pronounced on the right than on the left. The right ventricular to left ventricular diameter ratio is 1.7, consistent with right heart strain. The ascending thoracic aorta diameter measures 4.0 x 4.0 cm. There is no appreciable thoracic aortic dissection. There is atherosclerotic calcification throughout the aorta. The visualized great vessels appear normal except for mild scattered foci of atherosclerotic calcification. The right and left common carotid arteries arise as a common trunk, an anatomic variant. There is coronary artery calcification in the left anterior descending coronary artery. There is a small pericardial effusion. Mediastinum/Nodes: Thyroid appears diminutive. There is no appreciable thoracic adenopathy. Lungs/Pleura: There is a moderate pleural effusion on the left with left lower lobe consolidation. There is fatty prominence along the right posterior pleura with mild pleural thickening  posteriorly in this area. There is no edema or consolidation on the right. Upper Abdomen: Contrast is reflux into the inferior vena cava and hepatic veins. There is atherosclerotic calcification in the upper abdominal aorta. Visualized upper abdominal structures otherwise appear normal. Musculoskeletal: There is degenerative change in the thoracic spine. No blastic or lytic bone lesions. Patient is status post mastectomy on the left. There are surgical clips in left axillary region. Review of the MIP images confirms the above findings. IMPRESSION: Positive for acute PE with CT evidence of right heart strain (RV/LV Ratio = 1.7) consistent with at least submassive (intermediate risk) PE. The presence of right heart strain has been associated with an increased risk of morbidity and mortality. Please activate Code PE by paging [phone]. Pulmonary emboli are extensive bilaterally with extensive involving both main pulmonary arteries as well as multiple peripheral branches. Reflux of contrast into the inferior vena cava and hepatic veins is consistent with elevated right heart pressure. Prominence of the ascending thoracic aorta with a measured diameter 4.0 x 4.0 cm. Recommend annual imaging followup by CTA or MRA. This recommendation follows 2010 ACCF/AHA/AATS/ACR/ASA/SCA/SCAI/SIR/STS/SVM Guidelines for the Diagnosis and Management of Patients with Thoracic Aortic Disease. Circulation. 2010; [id number]. Moderate pleural effusion on the left with left lower lobe consolidation. Mild posterior pleural thickening on the right without edema or consolidation on the right. Small pericardial effusion. Multiple foci of coronary artery calcification. There is atherosclerotic calcification in the aorta. Status post left mastectomy. Critical Value/emergent results were called by telephone at the time of interpretation on 06/04/2016 at 11:33 am to Dr. Orlie Dakin , who verbally acknowledged these results. Electronically  Signed   By: Lowella Grip III M.D.   On: 06/21/2016 11:35   EKG: Independently reviewed.   Assessment/Plan Principal Problem:   Pulmonary emboli (HCC) Active Problems:   GI bleed   Diverticulosis of colon with hemorrhage   Acute respiratory failure with hypoxia (HCC)   Atrial fibrillation with RVR (HCC)   Pulmonary embolism -Bilateral extensive pulmonary embolism (at least submassive) with evidence of right heart strain, negative troponin. -Initially blood pressure was 86/69, this is corrected with only 500 mL infusion of normal saline. -Currently hemodynamically stable. -Start on heparin drip. Watch for bleeding as patient has recent lower GI bleed.  Atrial fibrillation with RVR -Has known paroxysmal atrial fibrillation, came in with RVR likely secondary to hypoxia/PE. -Pressure is marginal, not sure if she can tolerate contrast infusion. -Started metoprolol as needed for extreme tachycardia.  Acute respiratory failure with hypoxia -Currently on nonrebreather mask, this is likely secondary to bilateral PE. -Continue oxygen supplementation. -Patient is DNR/DNI.  Recent GI bleed with diverticulosis of the colon -Patient was recently in the hospital discharge on 05/21/2016 for lower GI bleed which presumed to be diverticular. -Patient will be on anticoagulation will follow closely for any evidence of bleeding.  History of left-sided breast cancer -Status post mastectomy in 1979, no history of recurrence that she knows of.  DVT prophylaxis: Therapeutic heparin Code Status: DNR Family Communication: Plan D/W patient Disposition Plan: Back to skilled nursing facility Consults called: PCCM, Dr. Curt Jews Admission status: SDU, inpatient   [facility] A MD [facility] Pager [phone]  If 7PM-7AM, please contact night-coverage [email or web address] Password [facility]  05/31/2016, 12:29 PM

## 2016-06-19 NOTE — Progress Notes (Signed)
PHARMACIST - PHYSICIAN COMMUNICATION CONCERNING:  IV heparin  63 yoF started on IV heparin for new DVT in femoral vein, profunda vein, popliteal vein, peroneal veins, and posterior tibial veins as well bilateral PE with evidence of right heart strain.  Also noted hx of recent GIB presumed to be diverticular. Please see note written by Sharen Hint, PharmD for more details.    Heparin infusion currently at 650 units/hr.  First heparin level = 0.20 (goal 0.3-0.7) subtherapeutic.  No infusion issues or bleeding per RN.    RECOMMENDATION: Heparin bolus 800 units (~14 units/kg) Increase to heparin 750 units/hr = 7.44ml/hr Monitor for signs and symptoms of bleeding F/u heparin level in 8 hours

## 2016-06-19 NOTE — Progress Notes (Addendum)
ANTICOAGULATION CONSULT NOTE - Initial Consult  Pharmacy Consult for heparin Indication: pulmonary embolus  Allergies  Allergen Reactions  . Lactose Intolerance (Gi) Other (See Comments)    unknown    Patient Measurements: Height: 5\' 3"  (160 cm) Weight: 123 lb (55.8 kg) IBW/kg (Calculated) : 52.4 Heparin Dosing Weight: 56kg  Vital Signs: Temp: 97.4 F (36.3 C) (07/24 0857) Temp Source: Oral (07/24 0857) BP: 118/84 (07/24 1200) Pulse Rate: 113 (07/24 1200)  Labs:  Recent Labs  06/05/2016 0924 06/14/2016 0930  HGB 10.5* 11.6*  HCT 33.0* 34.0*  PLT 281  --   CREATININE  --  0.80    Estimated Creatinine Clearance: 37.1 mL/min (by C-G formula based on SCr of 0.8 mg/dL).   Medical History: Past Medical History:  Diagnosis Date  . Cancer [facility]) 1979 mastectomy  . Cataract   . Elevated hemoglobin A1c   . GERD (gastroesophageal reflux disease)   . Glaucoma    Right pupil (about 44mm) is larger than left pupil (about 67mm) due to surgery in 1973.  Amblyopia in right eye.  Marland Kitchen Hyperlipidemia   . Hypertension   . Macular degeneration   . Meningitis     Medications:   (Not in a hospital admission) Scheduled:   Infusions:  . famotidine (PEPCID) IV 20 mg (05/29/2016 1229)    Assessment: 82yo F c/o SOB and tachycardia. EKG revealed Afib. CTA revealed PE w/ right heart strain. Pharmacy is asked to start heparin infusion. CBC is ok. SCr wnl. On no anticoagulation PTA. Baseline aPTT and PT/INR are wnl.   Goal of Therapy:  Heparin level 0.3-0.7 units/ml Monitor platelets by anticoagulation protocol: Yes   Plan:  Give heparin 1350units IV bolus then start infusion at 650units/hr. Check heparin level in 8hrs and daily thereafter. Check CBC q24h while on heparin. F/u daily.  Romeo Rabon, PharmD, pager [phone]. 06/15/2016,12:35 PM.

## 2016-06-19 NOTE — ED Provider Notes (Signed)
[facility] DEPT Provider Note   CSN: [id number] Arrival date & time: 06/02/2016  [id number]  First Provider Contact:  None       History   Chief Complaint Chief Complaint  Patient presents with  . Shortness of Breath   Level V caveat unstable vital signs HPI [name] is a 82 y.o. female.  HPI Patient complaint of shortness of breath last night. No other complaint. She denies chest pain denies other associated symptoms. She is presently asymptomatic. Treated with oxygen in the field. Denies pain anywhere. No other associated symptoms. Nothing makes symptoms better or worse. Noted to be in atrial fib with rapid ventricular response by EMS. Nothing makes symptoms better or worse. Past Medical History:  Diagnosis Date  . Cancer [facility]) 1979 mastectomy  . Cataract   . Elevated hemoglobin A1c   . GERD (gastroesophageal reflux disease)   . Glaucoma    Right pupil (about 16mm) is larger than left pupil (about 6mm) due to surgery in 1973.  Amblyopia in right eye.  Marland Kitchen Hyperlipidemia   . Hypertension   . Macular degeneration   . Meningitis     Patient Active Problem List   Diagnosis Date Noted  . Paroxysmal a-fib ([place]) 05/21/2016  . Absolute anemia   . Diverticulosis of colon with hemorrhage   . Acute blood loss anemia   . GI bleed 05/16/2016  . Lower GI bleed 05/16/2016  . Osteoarthritis of left knee 10/25/2015  . At high risk for falls 07/15/2015  . Encounter for Medicare annual wellness exam 07/15/2015  . Essential hypertension   . Atrial flutter, unspecified   . Vitamin D deficiency 10/12/2013  . Medication management 10/12/2013  . GERD (gastroesophageal reflux disease)   . Hyperlipidemia   . PreDiabetes   . Glaucoma     Past Surgical History:  Procedure Laterality Date  . ABDOMINAL HYSTERECTOMY    . BREAST SURGERY Left 1979   mastectomy  . COLON RESECTION    . COLONOSCOPY N/A 05/19/2016   Procedure: COLONOSCOPY;  Surgeon: Jerene Bears, MD;  Location: [facility]  ENDOSCOPY;  Service: Gastroenterology;  Laterality: N/A;  . CORONARY STENT PLACEMENT    . EYE SURGERY  1973   Dr. Katy Fitch  . MASTECTOMY    . SKIN CANCER EXCISION  2008    Squamous cell - left thigh    OB History    No data available       Home Medications    Prior to Admission medications   Medication Sig Start Date End Date Taking? Authorizing Provider  acetaminophen (TYLENOL) 500 MG tablet Take 1,000 mg by mouth every 8 (eight) hours as needed for moderate pain.    Historical Provider, MD  atenolol (TENORMIN) 100 MG tablet Take 1 tablet (100 mg total) by mouth daily. 03/21/16   Unk Pinto, MD  B Complex-C (B-COMPLEX WITH VITAMIN C) tablet Take 1 tablet by mouth daily.      Historical Provider, MD  bumetanide (BUMEX) 2 MG tablet TAKE 1 TABLET BY MOUTH  DAILY FOR BLOOD PRESSURE  AND FLUID 05/29/15   Unk Pinto, MD  Cholecalciferol (VITAMIN D3) 2000 units TABS Take 8,000 Units by mouth daily.    Historical Provider, MD  cholestyramine (QUESTRAN) 4 g packet Take 1 packet by mouth 2 (two) times daily. 01/20/16 01/19/17  Courtney Forcucci, PA-C  dorzolamide-timolol (COSOPT) 22.3-6.8 MG/ML ophthalmic solution Place 1 drop into the left eye 2 (two) times daily.  12/03/14   Historical Provider, MD  doxycycline (VIBRAMYCIN) 100 MG capsule Take 1 capsule (100 mg total) by mouth 2 (two) times daily. One po bid x 7 days 06/06/16   Courtney Forcucci, PA-C  DUREZOL 0.05 % EMUL Place 1 drop into the right eye 2 (two) times daily.  12/16/15   Historical Provider, MD  fluticasone (FLONASE) 50 MCG/ACT nasal spray Place 2 sprays into both nostrils daily. 06/06/16   Courtney Forcucci, PA-C  fluticasone furoate-vilanterol (BREO ELLIPTA) 100-25 MCG/INH AEPB Inhale 1 puff into the lungs daily. Rinse mouth after use.  Use until acute upper respiratory infection resolves. 06/06/16   Courtney Forcucci, PA-C  isosorbide mononitrate (IMDUR) 30 MG 24 hr tablet Take 1 tablet by mouth  every night at bedtime 06/01/15    Unk Pinto, MD  latanoprost (XALATAN) 0.005 % ophthalmic solution Place 1 drop into the left eye at bedtime.     Historical Provider, MD  loratadine (CLARITIN) 10 MG tablet Take 1 tablet (10 mg total) by mouth daily. 06/06/16 06/06/17  Courtney Forcucci, PA-C  losartan (COZAAR) 100 MG tablet Take one-half tablet by  mouth twice a day for blood pressure 05/29/15   Unk Pinto, MD  Pancrelipase, Lip-Prot-Amyl, 6000 units CPEP Take 1 capsule (6,000 Units total) by mouth 3 (three) times daily before meals. 01/25/16   Courtney Forcucci, PA-C  polyethylene glycol (MIRALAX / GLYCOLAX) packet Take 17 g by mouth daily as needed for mild constipation (take 1 packet per day until a bowel movement is acheived.). 06/06/16   Courtney Forcucci, PA-C  Zinc 50 MG TABS Take 50 mg by mouth daily.     Historical Provider, MD    Family History Family History  Problem Relation Age of Onset  . Hypertension Mother   . Hypertension Father   . CVA Father   . Arthritis Sister     Rheumatoid  . Heart attack Brother   . Heart disease Brother   . Diabetes Daughter   . Hypertension Daughter     Social History Social History  Substance Use Topics  . Smoking status: Never Smoker  . Smokeless tobacco: Never Used  . Alcohol use No     Allergies   Lactose intolerance (gi)   Review of Systems Review of Systems  Unable to perform ROS: Acuity of condition  Respiratory: Positive for shortness of breath.   Skin: Negative.   Psychiatric/Behavioral: Negative.      Physical Exam Updated Vital Signs BP (!) 86/69 (BP Location: Left Arm)   Pulse (!) 132   Temp 97.4 F (36.3 C) (Oral)   Resp 25   Ht 5\' 3"  (1.6 m)   Wt 123 lb (55.8 kg)   SpO2 91%   BMI 21.79 kg/m   Physical Exam  Constitutional: She appears well-developed. No distress.  HENT:  Head: Normocephalic and atraumatic.  Eyes: Conjunctivae are normal. Pupils are equal, round, and reactive to light.  Neck: Neck supple. No tracheal deviation  present. No thyromegaly present.  Cardiovascular:  No murmur heard. Irregularly irregular, tachycardic  Pulmonary/Chest: Effort normal and breath sounds normal.  Abdominal: Soft. Bowel sounds are normal. She exhibits no distension. There is no tenderness.  Genitourinary: Rectal exam shows guaiac negative stool.  Genitourinary Comments: Rectum normal tone, minimal stool on examining finger nontender Hemoccult negative  Musculoskeletal: Normal range of motion. She exhibits no edema or tenderness.  Neurological: She is alert. Coordination normal.  Skin: Skin is warm and dry. No rash noted.  Psychiatric: She has a normal mood and affect.  Nursing  note and vitals reviewed.    ED Treatments / Results  Labs (all labs ordered are listed, but only abnormal results are displayed) Labs Reviewed - No data to display ED ECG REPORT   Date: 06/01/2016  Rate: 140  Rhythm: atrial fibrillation  QRS Axis: right  Intervals: normal  ST/T Wave abnormalities: nonspecific T wave changes  Conduction Disutrbances:none  Narrative Interpretation:   Old EKG Reviewed: No significant change over 06/07/2016  I have personally reviewed the EKG tracing and agree with the computerized printout as noted. Results for orders placed or performed during the hospital encounter of 06/09/2016  CBC with Differential/Platelet  Result Value Ref Range   WBC 8.8 4.0 - 10.5 K/uL   RBC 3.78 (L) 3.87 - 5.11 MIL/uL   Hemoglobin 10.5 (L) 12.0 - 15.0 g/dL   HCT 33.0 (L) 36.0 - 46.0 %   MCV 87.3 78.0 - 100.0 fL   MCH 27.8 26.0 - 34.0 pg   MCHC 31.8 30.0 - 36.0 g/dL   RDW 15.5 11.5 - 15.5 %   Platelets 281 150 - 400 K/uL   Neutrophils Relative % 80 %   Lymphocytes Relative 13 %   Monocytes Relative 6 %   Eosinophils Relative 1 %   Basophils Relative 0 %   Neutro Abs 7.1 1.7 - 7.7 K/uL   Lymphs Abs 1.1 0.7 - 4.0 K/uL   Monocytes Absolute 0.5 0.1 - 1.0 K/uL   Eosinophils Absolute 0.1 0.0 - 0.7 K/uL   Basophils Absolute  0.0 0.0 - 0.1 K/uL   Smear Review MORPHOLOGY UNREMARKABLE   D-dimer, quantitative (not at [facility])  Result Value Ref Range   D-Dimer, Quant 10.64 (H) 0.00 - 0.50 ug/mL-FEU  Brain natriuretic peptide  Result Value Ref Range   B Natriuretic Peptide 474.6 (H) 0.0 - 100.0 pg/mL  Blood gas, arterial ([facility] & [facility] ONLY)  Result Value Ref Range   FIO2 1.00    Delivery systems NON-REBREATHER OXYGEN MASK    pH, Arterial 7.417 7.350 - 7.450   pCO2 arterial 30.7 (L) 35.0 - 45.0 mmHg   pO2, Arterial 95.8 80.0 - 100.0 mmHg   Bicarbonate 19.4 (L) 20.0 - 24.0 mEq/L   TCO2 18.0 0 - 100 mmol/L   Acid-base deficit 3.9 (H) 0.0 - 2.0 mmol/L   O2 Saturation 96.6 %   Patient temperature 98.6    Collection site LEFT RADIAL    Drawn by COLLECTED BY RT    Sample type ARTERIAL DRAW    Allens test (pass/fail) PASS PASS  I-stat troponin, ED  Result Value Ref Range   Troponin i, poc 0.02 0.00 - 0.08 ng/mL   Comment 3          I-stat chem 8, ed  Result Value Ref Range   Sodium 143 135 - 145 mmol/L   Potassium 3.0 (L) 3.5 - 5.1 mmol/L   Chloride 106 101 - 111 mmol/L   BUN 15 6 - 20 mg/dL   Creatinine, Ser 0.80 0.44 - 1.00 mg/dL   Glucose, Bld 146 (H) 65 - 99 mg/dL   Calcium, Ion 1.21 1.12 - 1.23 mmol/L   TCO2 23 0 - 100 mmol/L   Hemoglobin 11.6 (L) 12.0 - 15.0 g/dL   HCT 34.0 (L) 36.0 - 46.0 %  POC occult blood, ED  Result Value Ref Range   Fecal Occult Bld NEGATIVE NEGATIVE   Dg Chest 2 View  Result Date: 06/14/2016 CLINICAL DATA:  Shortness of Breath EXAM: CHEST  2 VIEW  COMPARISON:  June 10, 2015 FINDINGS: There is left lower lobe airspace consolidation with small left effusion. Lungs elsewhere clear. There is cardiomegaly with evidence a degree of pulmonary venous hypertension. There is atherosclerotic calcification in the aorta. Patient is status post left mastectomy with surgical clips in left axilla. No adenopathy. No bone lesions. IMPRESSION: Left lower lobe airspace consolidation with small left  pleural effusion. Lungs elsewhere clear. Evidence of pulmonary vascular congestion. There is aortic atherosclerosis. Electronically Signed   By: Lowella Grip III M.D.   On: 05/28/2016 09:19  Ct Angio Chest Pe W Or Wo Contrast  Result Date: 06/23/2016 CLINICAL DATA:  Shortness of Breath. History of left-sided breast carcinoma EXAM: CT ANGIOGRAPHY CHEST WITH CONTRAST TECHNIQUE: Multidetector CT imaging of the chest was performed using the standard protocol during bolus administration of intravenous contrast. Multiplanar CT image reconstructions and MIPs were obtained to evaluate the vascular anatomy. CONTRAST:  100 mL Isovue 370 nonionic COMPARISON:  Chest radiograph June 19, 2016 FINDINGS: Cardiovascular: There is extensive pulmonary embolus arising from the distal main pulmonary arteries bilaterally, extensive bilaterally,with extension into multiple upper and lower lobe pulmonary arterial branches, more pronounced on the right than on the left. The right ventricular to left ventricular diameter ratio is 1.7, consistent with right heart strain. The ascending thoracic aorta diameter measures 4.0 x 4.0 cm. There is no appreciable thoracic aortic dissection. There is atherosclerotic calcification throughout the aorta. The visualized great vessels appear normal except for mild scattered foci of atherosclerotic calcification. The right and left common carotid arteries arise as a common trunk, an anatomic variant. There is coronary artery calcification in the left anterior descending coronary artery. There is a small pericardial effusion. Mediastinum/Nodes: Thyroid appears diminutive. There is no appreciable thoracic adenopathy. Lungs/Pleura: There is a moderate pleural effusion on the left with left lower lobe consolidation. There is fatty prominence along the right posterior pleura with mild pleural thickening posteriorly in this area. There is no edema or consolidation on the right. Upper Abdomen: Contrast is  reflux into the inferior vena cava and hepatic veins. There is atherosclerotic calcification in the upper abdominal aorta. Visualized upper abdominal structures otherwise appear normal. Musculoskeletal: There is degenerative change in the thoracic spine. No blastic or lytic bone lesions. Patient is status post mastectomy on the left. There are surgical clips in left axillary region. Review of the MIP images confirms the above findings. IMPRESSION: Positive for acute PE with CT evidence of right heart strain (RV/LV Ratio = 1.7) consistent with at least submassive (intermediate risk) PE. The presence of right heart strain has been associated with an increased risk of morbidity and mortality. Please activate Code PE by paging [phone]. Pulmonary emboli are extensive bilaterally with extensive involving both main pulmonary arteries as well as multiple peripheral branches. Reflux of contrast into the inferior vena cava and hepatic veins is consistent with elevated right heart pressure. Prominence of the ascending thoracic aorta with a measured diameter 4.0 x 4.0 cm. Recommend annual imaging followup by CTA or MRA. This recommendation follows 2010 ACCF/AHA/AATS/ACR/ASA/SCA/SCAI/SIR/STS/SVM Guidelines for the Diagnosis and Management of Patients with Thoracic Aortic Disease. Circulation. 2010; [id number]. Moderate pleural effusion on the left with left lower lobe consolidation. Mild posterior pleural thickening on the right without edema or consolidation on the right. Small pericardial effusion. Multiple foci of coronary artery calcification. There is atherosclerotic calcification in the aorta. Status post left mastectomy. Critical Value/emergent results were called by telephone at the time of interpretation on 06/14/2016 at  11:33 am to Dr. Orlie Dakin , who verbally acknowledged these results. Electronically Signed   By: Lowella Grip III M.D.   On: 06/18/2016 11:35  EKG  EKG Interpretation None       10 AM patient's pulse oximetry noted to be 87-88% on oxygen 8 L via facemask. After treatment with IV fluid bolus she had received a 400 mL of normal saline out of a 500 mL bolus ordered and saline bolus was discontinued Oxygen was increased to 10 L via facemask. Patient denies dyspnea. Lungs with crackles at bases. Radiology No results found.   Procedures Procedures (including critical care time)  Medications Ordered in ED Medications - No data to display   Initial Impression / Assessment and Plan / ED Course  I have reviewed the triage vital signs and the nursing notes.  Pertinent labs & imaging results that were available during my care of the patient were reviewed by me and considered in my medical decision making (see chart for details).  Clinical Course  Value Comment By Time  EKG 12-Lead (Reviewed) Orlie Dakin, MD 07/24 [phone]  EKG 12-Lead (Reviewed) Orlie Dakin, MD 07/24 0905   10:10 AM patient received Cardizem 10 mg IV. Heart rate went to 90, atrial fibrillation. Patient feels well Orlie Dakin, MD 07/24 1014   Chest x-ray viewed by me. CT angiogram discussed with radiologist. As result hospital critical care team Dr.Debios consulted by telephone. He suggests discussion with patient and family regarding benefits and risks of anticoagulation. I discussed with patient. She requested I call her primary care physician. I consulted her PCP Dr.Mckeown via telephone who advocates anticoagulation with heparin. I'm in agreement. I also consulted Dr.Elmahi hospitalist service to make arrangements for admission to stepdown unit. IV Pepcid ordered , hopefully his preventative 2 GI bleeding. Heparin for pharmacy protocol for pulmonary embolism ordered   Final Clinical Impressions(s) / ED Diagnoses   Final diagnoses:  None  Patient will be admitted to stepdown unit Diagnosis #1 massive pulmonary embolism #2 atrial fibrillation with rapid ventricular response #3 hypotension #4  hypokalemia #5 hypoxia CRITICAL CARE Performed by: Orlie Dakin Total critical care time: 60 minutes Critical care time was exclusive of separately billable procedures and treating other patients. Critical care was necessary to treat or prevent imminent or life-threatening deterioration. Critical care was time spent personally by me on the following activities: development of treatment plan with patient and/or surrogate as well as nursing, discussions with consultants, evaluation of patient's response to treatment, examination of patient, obtaining history from patient or surrogate, ordering and performing treatments and interventions, ordering and review of laboratory studies, ordering and review of radiographic studies, pulse oximetry and re-evaluation of patient's condition.  New Prescriptions New Prescriptions   No medications on file     Orlie Dakin, MD 06/16/2016 1224

## 2016-06-19 NOTE — ED Notes (Signed)
Respiratory therapist at bedside collecting ABG.

## 2016-06-19 NOTE — Progress Notes (Signed)
  Echocardiogram 2D Echocardiogram has been performed.  Donata Clay 06/13/2016, 2:55 PM

## 2016-06-19 NOTE — ED Notes (Signed)
Bed: [id number] Expected date:  Expected time:  Means of arrival:  Comments: 82 yo SOB

## 2016-06-19 NOTE — ED Notes (Signed)
Lab states they will run APTT and Protime-INR on previously collected blood.

## 2016-06-19 NOTE — ED Notes (Signed)
RN accompanied patient to CT. Patient on cardiac monitor and pulse oximetry. Patient gave RN permission to speak to granddaughter on phone.

## 2016-06-19 NOTE — Progress Notes (Addendum)
VASCULAR LAB PRELIMINARY  PRELIMINARY  PRELIMINARY  PRELIMINARY  Bilateral lower extremity venous duplex has been completed.    Right:  DVT noted Femoral  Vein, Profunda vein, popliteal vein, peroneal veins, and posterior tibial veins.   Left: No evidence of DVT.  Bilateral: No evidence of superficial thrombosis.  No Baker's cyst.  Gave results to Ruby, RN @3 :00 pm  Janifer Adie, RVT, RDMS 06/09/2016, 3:09 PM

## 2016-06-20 ENCOUNTER — Inpatient Hospital Stay (HOSPITAL_COMMUNITY): Payer: Medicare Other

## 2016-06-20 DIAGNOSIS — I82491 Acute embolism and thrombosis of other specified deep vein of right lower extremity: Secondary | ICD-10-CM

## 2016-06-20 DIAGNOSIS — I82401 Acute embolism and thrombosis of unspecified deep veins of right lower extremity: Secondary | ICD-10-CM

## 2016-06-20 DIAGNOSIS — L8990 Pressure ulcer of unspecified site, unspecified stage: Secondary | ICD-10-CM | POA: Insufficient documentation

## 2016-06-20 LAB — HEPARIN LEVEL (UNFRACTIONATED): Heparin Unfractionated: 0.39 IU/mL (ref 0.30–0.70)

## 2016-06-20 LAB — CBC
HEMATOCRIT: 34.4 % — AB (ref 36.0–46.0)
HEMOGLOBIN: 10.8 g/dL — AB (ref 12.0–15.0)
MCH: 27.7 pg (ref 26.0–34.0)
MCHC: 31.4 g/dL (ref 30.0–36.0)
MCV: 88.2 fL (ref 78.0–100.0)
Platelets: 263 10*3/uL (ref 150–400)
RBC: 3.9 MIL/uL (ref 3.87–5.11)
RDW: 15.7 % — AB (ref 11.5–15.5)
WBC: 8.6 10*3/uL (ref 4.0–10.5)

## 2016-06-20 LAB — BASIC METABOLIC PANEL
ANION GAP: 11 (ref 5–15)
BUN: 16 mg/dL (ref 6–20)
CHLORIDE: 114 mmol/L — AB (ref 101–111)
CO2: 16 mmol/L — ABNORMAL LOW (ref 22–32)
Calcium: 9.1 mg/dL (ref 8.9–10.3)
Creatinine, Ser: 0.68 mg/dL (ref 0.44–1.00)
GFR calc Af Amer: >60 mL/min (ref >60)
GLUCOSE: 166 mg/dL — AB (ref 65–99)
POTASSIUM: 3.9 mmol/L (ref 3.5–5.1)
Sodium: 141 mmol/L (ref 135–145)

## 2016-06-20 LAB — TROPONIN I: Troponin I: 0.06 ng/mL (ref <0.03)

## 2016-06-20 MED ORDER — LORAZEPAM 0.5 MG PO TABS
0.2500 mg | ORAL_TABLET | Freq: Two times a day (BID) | ORAL | Status: DC | PRN
  Administered 2016-06-20 – 2016-06-24 (×3): 0.25 mg via ORAL
  Filled 2016-06-20 (×3): qty 1

## 2016-06-20 MED ORDER — CETYLPYRIDINIUM CHLORIDE 0.05 % MT LIQD
7.0000 mL | Freq: Two times a day (BID) | OROMUCOSAL | Status: DC
  Administered 2016-06-20 – 2016-06-25 (×11): 7 mL via OROMUCOSAL

## 2016-06-20 MED ORDER — RIVAROXABAN 15 MG PO TABS
15.0000 mg | ORAL_TABLET | Freq: Two times a day (BID) | ORAL | Status: DC
  Administered 2016-06-20 – 2016-06-23 (×8): 15 mg via ORAL
  Filled 2016-06-20 (×8): qty 1

## 2016-06-20 MED ORDER — RIVAROXABAN 20 MG PO TABS: 20.0000 mg | ORAL_TABLET | Freq: Every day | ORAL | Status: DC

## 2016-06-20 MED ORDER — OLANZAPINE 5 MG PO TBDP
2.5000 mg | ORAL_TABLET | ORAL | Status: DC | PRN
  Administered 2016-06-20: 2.5 mg via ORAL
  Filled 2016-06-20 (×2): qty 0.5

## 2016-06-20 NOTE — Progress Notes (Signed)
ANTICOAGULATION CONSULT NOTE - Initial Consult  Pharmacy Consult for Xarelto Indication: pulmonary embolus and DVT  Allergies  Allergen Reactions  . Lactose Intolerance (Gi) Other (See Comments)    unknown    Patient Measurements: Height: 5\' 3"  (160 cm) Weight: 126 lb 12.2 oz (57.5 kg) IBW/kg (Calculated) : 52.4  Vital Signs: Temp: 97.9 F (36.6 C) (07/25 0800) Temp Source: Oral (07/25 0800) BP: 121/63 (07/25 0757) Pulse Rate: 102 (07/25 0757)  Labs:  Recent Labs  06/25/2016 0924 06/13/2016 0930 06/20/2016 1333 06/17/2016 1503 06/07/2016 1855 05/30/2016 2104 06/20/16 0112 06/20/16 0629  HGB 10.5* 11.6*  --   --   --   --   --  10.8*  HCT 33.0* 34.0*  --   --   --   --   --  34.4*  PLT 281  --   --   --   --   --   --  263  APTT 29  --   --   --   --   --   --   --   LABPROT 15.1  --   --  15.4*  --   --   --   --   INR 1.17  --   --  1.20  --   --   --   --   HEPARINUNFRC  --   --   --   --   --  0.20*  --  0.39  CREATININE  --  0.80  --   --   --   --   --  0.68  TROPONINI  --   --  0.06*  --  0.06*  --  0.06*  --     Estimated Creatinine Clearance: 37.1 mL/min (by C-G formula based on SCr of 0.8 mg/dL).   Medical History: Past Medical History:  Diagnosis Date  . Cancer [facility]) 1979 mastectomy  . Cataract   . Elevated hemoglobin A1c   . GERD (gastroesophageal reflux disease)   . Glaucoma    Right pupil (about 54mm) is larger than left pupil (about 46mm) due to surgery in 1973.  Amblyopia in right eye.  Marland Kitchen Hyperlipidemia   . Hypertension   . Macular degeneration   . Meningitis     Medications:  Scheduled:  . fluticasone furoate-vilanterol  1 puff Inhalation Daily  . pantoprazole  40 mg Oral Daily  . sodium chloride flush  3 mL Intravenous Q12H   Infusions:  . sodium chloride 10 mL/hr at 06/13/2016 1745  . diltiazem (CARDIZEM) infusion 10 mg/hr (06/20/16 0000)  . heparin 750 Units/hr (06/05/2016 2200)    Assessment: 82yo F presented to ED on 7/24 with c/o SOB  and tachycardia. EKG revealed Afib. CTA revealed PE w/ right heart strain.  LE venous doppler + acute right DVT.  Pharmacy was initially consulted to start heparin infusion, and now consulted to transition to [place].  PMH is significant for PAF (not on anticoagulation) and recent GIB presumed to be diverticular (discharged 05/21/16).  Today, 06/20/2016:  Heparin level: 0.39, therapeutic  CBC: Hgb low but stable near baseline on admission, Plt remain WNL  No bleeding or complications reported.  Fecal occult blood negative (7/24)  SCr 0.68 with CrCl ~ 37 ml/min (CrCl ~ 32 ml/min using SCr 1 rounded up for age)  Diet: heart healthy  Drug-drug interactions: Diltiazem (moderate CYP3A4 inhibitor) may increase the serum concentration of Rivaroxaban.   Goal of Therapy:  VTE treatment Monitor platelets by anticoagulation  protocol: Yes   Plan:   Xarelto 15 mg PO BID x21 days, then Xarelto 20mg  once daily with supper.  Monitor CBC, renal function, s/s bleeding.  Pharmacy to provide patient education prior to discharge.   Gretta Arab PharmD, BCPS Pager [phone] 06/20/2016 10:06 AM

## 2016-06-20 NOTE — Progress Notes (Signed)
PROGRESS NOTE    [name]  [id number] DOB: [birth year] DOA: 06/23/2016 PCP: [name], MD  Outpatient Specialists:   Brief Narrative: 82 y.o. female with medical history significant of history of A. fib (not on anticoagulations), remote history of left-sided breast cancer, hypertension, GERD and recent lower GI bleed (discharge from the hospital about a month ago for GI bleed).  Patient sent from her SNF because of SOB. She recalled transient SOB over the past few days but this morning she had very severe shortness of breath so she was brought to the hospital for further evaluation she was tachycardic as well. Upon initial evaluation in the ED she was found to have A. fib with RVR, initial blood pressure was 86/69 so she was given 500 mL of normal saline, patient currently very hypoxic and she is on a nonrebreather mask satting in the low 90s. CT scan showed extensive PE bilaterally. Doppler ultrasound revealed extensive DVT of right lower extremity. Critical input is appreciated. Also discussed with Hematology, Dr. Earlie Server and current management was recommended (Heparin for now).  Assessment & Plan:   Principal Problem:   Pulmonary emboli (HCC)   Extensive DVT Right lower extremity  Active Problems:   GI bleed, recent (about a month prior to presentation)   Diverticulosis of colon with hemorrhage   Acute respiratory failure with hypoxia (HCC)   Atrial fibrillation with RVR (HCC)   Pressure ulcer  Pulmonary embolism -Bilateral extensive pulmonary embolism (at least submassive), Troponin of 0.06 (non rising). However, ECHO reveals normal right ventricle. -Initially blood pressure was 86/69, this is corrected with only 500 mL infusion of normal saline. -Currently hemodynamically stable. -On heparin drip. Watch for bleeding as patient has recent lower GI bleed. -Critical input and Hematology input is appreciated. -Low threshold to consult palliative  care.  Atrial fibrillation with RVR - Heart rate is now controlled. -Has known paroxysmal atrial fibrillation, came in with RVR likely secondary to hypoxia/PE. - On cardizem drip, and PRN IV Lopressor.  Acute respiratory failure with hypoxia -Currently on nonrebreather mask, this is likely secondary to bilateral PE. -Continue oxygen supplementation. -Patient is DNR/DNI.  Recent GI bleed with diverticulosis of the colon -Patient was recently in the hospital discharge on 05/21/2016 for lower GI bleed which presumed to be diverticular. -Patient will be on anticoagulation will follow closely for any evidence of bleeding. Patient is high risk. Discussed with Critical Team and Hematologist.  History of left-sided breast cancer -Status post mastectomy in 1979, no history of recurrence that she knows of.  DVT prophylaxis: Therapeutic heparin Code Status: DNR Family Communication:   Disposition Plan: Back to skilled nursing facility Consults called: Hematology. Critical care is already following the patient Admission status: SDU, inpatient  Subjective: Nil new complaints. No chest pain. No fever or chills.  Objective: Vitals:   06/20/16 0400 06/20/16 0500 06/20/16 0600 06/20/16 0757  BP: (!) 135/97 125/89 (!) 127/55 121/63  Pulse: 97 92 (!) 107 (!) 102  Resp: (!) 25 (!) 24 (!) 26 20  Temp: 97.3 F (36.3 C)     TempSrc: Axillary     SpO2: 98% 96% 99% 98%  Weight:      Height:        Intake/Output Summary (Last 24 hours) at 06/20/16 0830 Last data filed at 06/20/16 0413  Gross per 24 hour  Intake            61.66 ml  Output  200 ml  Net          -138.34 ml   Filed Weights   06/21/2016 0852 06/01/2016 1335  Weight: 55.8 kg (123 lb) 57.5 kg (126 lb 12.2 oz)    Examination:  General exam: Appears calm and comfortable. On supplemental oxygen. Respiratory system: Clear to auscultation. Respiratory effort normal. Cardiovascular system: S1 & S2 heard,  . Gastrointestinal system: Abdomen is nondistended, soft and nontender.  Central nervous system: Alert and oriented. Moves all limbs. Extremities: Mild swelling RLE. Data Reviewed: I have personally reviewed following labs and imaging studies  CBC:  Recent Labs Lab 06/18/2016 0924 06/06/2016 0930 06/20/16 0629  WBC 8.8  --  8.6  NEUTROABS 7.1  --   --   HGB 10.5* 11.6* 10.8*  HCT 33.0* 34.0* 34.4*  MCV 87.3  --  88.2  PLT 281  --  99991111   Basic Metabolic Panel:  Recent Labs Lab 06/10/2016 0930 06/20/16 0629  NA 143 141  K 3.0* 3.9  CL 106 114*  CO2  --  16*  GLUCOSE 146* 166*  BUN 15 16  CREATININE 0.80 0.68  CALCIUM  --  9.1   GFR: Estimated Creatinine Clearance: 37.1 mL/min (by C-G formula based on SCr of 0.8 mg/dL). Liver Function Tests: No results for input(s): AST, ALT, ALKPHOS, BILITOT, PROT, ALBUMIN in the last 168 hours. No results for input(s): LIPASE, AMYLASE in the last 168 hours. No results for input(s): AMMONIA in the last 168 hours. Coagulation Profile:  Recent Labs Lab 06/12/2016 0924 06/24/2016 1503  INR 1.17 1.20   Cardiac Enzymes:  Recent Labs Lab 05/31/2016 1333 06/21/2016 1855 06/20/16 0112  TROPONINI 0.06* 0.06* 0.06*   BNP (last 3 results) No results for input(s): PROBNP in the last 8760 hours. HbA1C: No results for input(s): HGBA1C in the last 72 hours. CBG: No results for input(s): GLUCAP in the last 168 hours. Lipid Profile: No results for input(s): CHOL, HDL, LDLCALC, TRIG, CHOLHDL, LDLDIRECT in the last 72 hours. Thyroid Function Tests: No results for input(s): TSH, T4TOTAL, FREET4, T3FREE, THYROIDAB in the last 72 hours. Anemia Panel: No results for input(s): VITAMINB12, FOLATE, FERRITIN, TIBC, IRON, RETICCTPCT in the last 72 hours. Urine analysis:    Component Value Date/Time   COLORURINE YELLOW 12/20/2015 1103   APPEARANCEUR CLEAR 12/20/2015 1103   LABSPEC 1.007 12/20/2015 1103   PHURINE 6.0 12/20/2015 1103   GLUCOSEU NEGATIVE  12/20/2015 1103   HGBUR NEGATIVE 12/20/2015 1103   BILIRUBINUR NEGATIVE 12/20/2015 1103   KETONESUR NEGATIVE 12/20/2015 1103   PROTEINUR NEGATIVE 12/20/2015 1103   UROBILINOGEN 0.2 12/27/2014 1020   NITRITE NEGATIVE 12/20/2015 1103   LEUKOCYTESUR NEGATIVE 12/20/2015 1103   Sepsis Labs: @LABRCNTIP (procalcitonin:4,lacticidven:4)  ) Recent Results (from the past 240 hour(s))  MRSA PCR Screening     Status: None   Collection Time: 06/16/2016  5:00 PM  Result Value Ref Range Status   MRSA by PCR NEGATIVE NEGATIVE Final    Comment:        The GeneXpert MRSA Assay (FDA approved for NASAL specimens only), is one component of a comprehensive MRSA colonization surveillance program. It is not intended to diagnose MRSA infection nor to guide or monitor treatment for MRSA infections.          Radiology Studies: Dg Chest 2 View  Result Date: 06/09/2016 CLINICAL DATA:  Shortness of Breath EXAM: CHEST  2 VIEW COMPARISON:  June 10, 2015 FINDINGS: There is left lower lobe airspace consolidation with small left  effusion. Lungs elsewhere clear. There is cardiomegaly with evidence a degree of pulmonary venous hypertension. There is atherosclerotic calcification in the aorta. Patient is status post left mastectomy with surgical clips in left axilla. No adenopathy. No bone lesions. IMPRESSION: Left lower lobe airspace consolidation with small left pleural effusion. Lungs elsewhere clear. Evidence of pulmonary vascular congestion. There is aortic atherosclerosis. Electronically Signed   By: Lowella Grip III M.D.   On: 06/03/2016 09:19  Ct Angio Chest Pe W Or Wo Contrast  Result Date: 06/16/2016 CLINICAL DATA:  Shortness of Breath. History of left-sided breast carcinoma EXAM: CT ANGIOGRAPHY CHEST WITH CONTRAST TECHNIQUE: Multidetector CT imaging of the chest was performed using the standard protocol during bolus administration of intravenous contrast. Multiplanar CT image reconstructions and MIPs  were obtained to evaluate the vascular anatomy. CONTRAST:  100 mL Isovue 370 nonionic COMPARISON:  Chest radiograph June 19, 2016 FINDINGS: Cardiovascular: There is extensive pulmonary embolus arising from the distal main pulmonary arteries bilaterally, extensive bilaterally,with extension into multiple upper and lower lobe pulmonary arterial branches, more pronounced on the right than on the left. The right ventricular to left ventricular diameter ratio is 1.7, consistent with right heart strain. The ascending thoracic aorta diameter measures 4.0 x 4.0 cm. There is no appreciable thoracic aortic dissection. There is atherosclerotic calcification throughout the aorta. The visualized great vessels appear normal except for mild scattered foci of atherosclerotic calcification. The right and left common carotid arteries arise as a common trunk, an anatomic variant. There is coronary artery calcification in the left anterior descending coronary artery. There is a small pericardial effusion. Mediastinum/Nodes: Thyroid appears diminutive. There is no appreciable thoracic adenopathy. Lungs/Pleura: There is a moderate pleural effusion on the left with left lower lobe consolidation. There is fatty prominence along the right posterior pleura with mild pleural thickening posteriorly in this area. There is no edema or consolidation on the right. Upper Abdomen: Contrast is reflux into the inferior vena cava and hepatic veins. There is atherosclerotic calcification in the upper abdominal aorta. Visualized upper abdominal structures otherwise appear normal. Musculoskeletal: There is degenerative change in the thoracic spine. No blastic or lytic bone lesions. Patient is status post mastectomy on the left. There are surgical clips in left axillary region. Review of the MIP images confirms the above findings. IMPRESSION: Positive for acute PE with CT evidence of right heart strain (RV/LV Ratio = 1.7) consistent with at least  submassive (intermediate risk) PE. The presence of right heart strain has been associated with an increased risk of morbidity and mortality. Please activate Code PE by paging [phone]. Pulmonary emboli are extensive bilaterally with extensive involving both main pulmonary arteries as well as multiple peripheral branches. Reflux of contrast into the inferior vena cava and hepatic veins is consistent with elevated right heart pressure. Prominence of the ascending thoracic aorta with a measured diameter 4.0 x 4.0 cm. Recommend annual imaging followup by CTA or MRA. This recommendation follows 2010 ACCF/AHA/AATS/ACR/ASA/SCA/SCAI/SIR/STS/SVM Guidelines for the Diagnosis and Management of Patients with Thoracic Aortic Disease. Circulation. 2010; 121SP:1689793. Moderate pleural effusion on the left with left lower lobe consolidation. Mild posterior pleural thickening on the right without edema or consolidation on the right. Small pericardial effusion. Multiple foci of coronary artery calcification. There is atherosclerotic calcification in the aorta. Status post left mastectomy. Critical Value/emergent results were called by telephone at the time of interpretation on 06/12/2016 at 11:33 am to Dr. Orlie Dakin , who verbally acknowledged these results. Electronically Signed  By: Lowella Grip III M.D.   On: 06/13/2016 11:35       Scheduled Meds: . fluticasone furoate-vilanterol  1 puff Inhalation Daily  . pantoprazole  40 mg Oral Daily  . sodium chloride flush  3 mL Intravenous Q12H   Continuous Infusions: . sodium chloride 10 mL/hr at 06/15/2016 1745  . diltiazem (CARDIZEM) infusion 10 mg/hr (06/20/16 0000)  . heparin 750 Units/hr (06/03/2016 2200)     LOS: 1 day    Time spent: 86 Minutes    Dana Allan, MD  [facility] Pager #: [phone] 7PM-7AM contact night coverage as above

## 2016-06-20 NOTE — Progress Notes (Signed)
ANTICOAGULATION CONSULT NOTE - Follow Up Consult  Pharmacy Consult for Heparin Indication: new DVT in femoral vein, profunda vein, popliteal vein, peroneal veins, and posterior tibial veins as well bilateral PE with evidence of right heart strain.  Allergies  Allergen Reactions  . Lactose Intolerance (Gi) Other (See Comments)    unknown    Patient Measurements: Height: 5\' 3"  (160 cm) Weight: 126 lb 12.2 oz (57.5 kg) IBW/kg (Calculated) : 52.4 Heparin Dosing Weight:   Vital Signs: Temp: 97.3 F (36.3 C) (07/25 0400) Temp Source: Axillary (07/25 0400) BP: 127/55 (07/25 0600) Pulse Rate: 107 (07/25 0600)  Labs:  Recent Labs  06/20/2016 0924 06/18/2016 0930 06/11/2016 1333 06/04/2016 1503 05/29/2016 1855 06/01/2016 2104 06/20/16 0112 06/20/16 0629  HGB 10.5* 11.6*  --   --   --   --   --  10.8*  HCT 33.0* 34.0*  --   --   --   --   --  34.4*  PLT 281  --   --   --   --   --   --  263  APTT 29  --   --   --   --   --   --   --   LABPROT 15.1  --   --  15.4*  --   --   --   --   INR 1.17  --   --  1.20  --   --   --   --   HEPARINUNFRC  --   --   --   --   --  0.20*  --  0.39  CREATININE  --  0.80  --   --   --   --   --   --   TROPONINI  --   --  0.06*  --  0.06*  --  0.06*  --     Estimated Creatinine Clearance: 37.1 mL/min (by C-G formula based on SCr of 0.8 mg/dL).   Medications:  Infusions:  . sodium chloride 10 mL/hr at 06/08/2016 1745  . diltiazem (CARDIZEM) infusion 10 mg/hr (06/20/16 0000)  . heparin 750 Units/hr (06/06/2016 2200)    Assessment: Patient with heparin level at goal.  No heparin issues noted.  Goal of Therapy:  Heparin level 0.3-0.7 units/ml Monitor platelets by anticoagulation protocol: Yes   Plan:  Continue heparin drip at current rate Recheck level 1400  Tyler Deis, [facility] Kerche[facility] Crowford 06/20/2016,6:53 AM

## 2016-06-20 NOTE — Progress Notes (Signed)
Name: [name] MRN: [id number] DOB: 08/08/1922    ADMISSION DATE:  06/02/2016 CONSULTATION DATE:  06/14/2016  REFERRING MD :  Dr. Winfred Leeds  CHIEF COMPLAINT:  SOB / PE   BRIEF SUMMARY:  82 y/o F, SNF resident, with PMH of PAF (not on anticoagulation) and recent GIB related to diverticulosis (6/25 discharge) admitted with SOB in the setting of PE.  RV/LV ratio of 1.7.  Heparin gtt initiated.    SUBJECTIVE:  Pt reports need for bedpan.  Denies chest pain / SOB.  No bleeding - hemoptysis, melena.   VITAL SIGNS: Temp:  [97.1 F (36.2 C)-97.9 F (36.6 C)] 97.9 F (36.6 C) (07/25 0800) Pulse Rate:  [25-145] 102 (07/25 0757) Resp:  [19-35] 20 (07/25 0757) BP: (96-164)/(55-108) 121/63 (07/25 0757) SpO2:  [85 %-100 %] 98 % (07/25 0757) Weight:  [126 lb 12.2 oz (57.5 kg)] 126 lb 12.2 oz (57.5 kg) (07/24 1335)  PHYSICAL EXAMINATION: General:  Well developed elderly female in NAD  Neuro:  Awake, alert, oriented, somewhat repetitive in conversation, MAE, R pupil irregular 67mm (prior surgery), L 2-3 mm HEENT:  Mm pink/moist, no jvd  Cardiovascular:  s1s2 irr irr, AF on monitor, no m/r/g  Lungs:  Even/non-labored, lungs bilaterally clear, good air movement, tachypnea (high 20's) Abdomen:  Obese/soft, bsx4 active  Musculoskeletal:  No acute deformities  Skin:  Warm/dry, trace BLE edema R>L   Recent Labs Lab 06/18/2016 0930 06/20/16 0629  NA 143 141  K 3.0* 3.9  CL 106 114*  CO2  --  16*  BUN 15 16  CREATININE 0.80 0.68  GLUCOSE 146* 166*    Recent Labs Lab 06/25/2016 0924 06/25/2016 0930 06/20/16 0629  HGB 10.5* 11.6* 10.8*  HCT 33.0* 34.0* 34.4*  WBC 8.8  --  8.6  PLT 281  --  263   Dg Chest 2 View  Result Date: 06/01/2016 CLINICAL DATA:  Shortness of Breath EXAM: CHEST  2 VIEW COMPARISON:  June 10, 2015 FINDINGS: There is left lower lobe airspace consolidation with small left effusion. Lungs elsewhere clear. There is cardiomegaly with evidence a degree of pulmonary  venous hypertension. There is atherosclerotic calcification in the aorta. Patient is status post left mastectomy with surgical clips in left axilla. No adenopathy. No bone lesions. IMPRESSION: Left lower lobe airspace consolidation with small left pleural effusion. Lungs elsewhere clear. Evidence of pulmonary vascular congestion. There is aortic atherosclerosis. Electronically Signed   By: Lowella Grip III M.D.   On: 06/18/2016 09:19  Ct Angio Chest Pe W Or Wo Contrast  Result Date: 06/10/2016 CLINICAL DATA:  Shortness of Breath. History of left-sided breast carcinoma EXAM: CT ANGIOGRAPHY CHEST WITH CONTRAST TECHNIQUE: Multidetector CT imaging of the chest was performed using the standard protocol during bolus administration of intravenous contrast. Multiplanar CT image reconstructions and MIPs were obtained to evaluate the vascular anatomy. CONTRAST:  100 mL Isovue 370 nonionic COMPARISON:  Chest radiograph June 19, 2016 FINDINGS: Cardiovascular: There is extensive pulmonary embolus arising from the distal main pulmonary arteries bilaterally, extensive bilaterally,with extension into multiple upper and lower lobe pulmonary arterial branches, more pronounced on the right than on the left. The right ventricular to left ventricular diameter ratio is 1.7, consistent with right heart strain. The ascending thoracic aorta diameter measures 4.0 x 4.0 cm. There is no appreciable thoracic aortic dissection. There is atherosclerotic calcification throughout the aorta. The visualized great vessels appear normal except for mild scattered foci of atherosclerotic calcification. The right and left common  carotid arteries arise as a common trunk, an anatomic variant. There is coronary artery calcification in the left anterior descending coronary artery. There is a small pericardial effusion. Mediastinum/Nodes: Thyroid appears diminutive. There is no appreciable thoracic adenopathy. Lungs/Pleura: There is a moderate pleural  effusion on the left with left lower lobe consolidation. There is fatty prominence along the right posterior pleura with mild pleural thickening posteriorly in this area. There is no edema or consolidation on the right. Upper Abdomen: Contrast is reflux into the inferior vena cava and hepatic veins. There is atherosclerotic calcification in the upper abdominal aorta. Visualized upper abdominal structures otherwise appear normal. Musculoskeletal: There is degenerative change in the thoracic spine. No blastic or lytic bone lesions. Patient is status post mastectomy on the left. There are surgical clips in left axillary region. Review of the MIP images confirms the above findings. IMPRESSION: Positive for acute PE with CT evidence of right heart strain (RV/LV Ratio = 1.7) consistent with at least submassive (intermediate risk) PE. The presence of right heart strain has been associated with an increased risk of morbidity and mortality. Please activate Code PE by paging [phone]. Pulmonary emboli are extensive bilaterally with extensive involving both main pulmonary arteries as well as multiple peripheral branches. Reflux of contrast into the inferior vena cava and hepatic veins is consistent with elevated right heart pressure. Prominence of the ascending thoracic aorta with a measured diameter 4.0 x 4.0 cm. Recommend annual imaging followup by CTA or MRA. This recommendation follows 2010 ACCF/AHA/AATS/ACR/ASA/SCA/SCAI/SIR/STS/SVM Guidelines for the Diagnosis and Management of Patients with Thoracic Aortic Disease. Circulation. 2010; [id number]. Moderate pleural effusion on the left with left lower lobe consolidation. Mild posterior pleural thickening on the right without edema or consolidation on the right. Small pericardial effusion. Multiple foci of coronary artery calcification. There is atherosclerotic calcification in the aorta. Status post left mastectomy. Critical Value/emergent results were called by  telephone at the time of interpretation on 06/13/2016 at 11:33 am to Dr. Orlie Dakin , who verbally acknowledged these results. Electronically Signed   By: Lowella Grip III M.D.   On: 06/12/2016 11:35  SIGNIFICANT EVENTS  7/24  Admit with SOB, work up positive for PE with RV/LV ratio of 1.7  STUDIES:  CTA Chest 7/24 >> acute PE with evidence of right heart strain with RV/LV ratio of 1.7, extensive PE bilaterally involving both main pulmonary arteries as well as peripheral branches LE Doppler 7/24 >> acute DVT in R profunda femoris vein, R popliteal vein, R posterior tibial vein, R peroneal vein.  Neg on Left. ECHO 7/24 >> mild LVH, LVEF 50-55%, normal RV size / function, trace L pleural effusion, mild AR, MR.  Mildly dilated RA  ASSESSMENT / PLAN:  Acute PE - submassive, RV/LV ratio of 1.7 on admit, likely provoked with recent admit for GIB / bedrest period RLE Swelling / Acute DVT  Small Left Pleural Effusion Recent Lower GIB - in setting of diverticulosis   Plan: SDU monitoring  Heparin gtt per pharmacy for PE > will have to cautiously monitor her on anticoagulation Not a candidate for TPA (not hemodynamically unstable to warrant) or EKOS intervention  O2 to keep sats > 92% Trend troponin  If she does not tolerate anticoagulation from a bleeding standpoint, she will need IVC filter placement Bedrest until heparin level therapeutic  DNR/DNI Consider palliative care consultation given increased work of breathing Pulmonary hygiene as able   PCCM will follow along with you.    Noe Gens,  NP-C [facility] Pgr: [phone] or if no answer [phone] 06/20/2016, 8:59 AM  Attending:  I have seen and examined the patient with nurse practitioner/resident and agree with the note above.  We formulated the plan together and I elicited the following history.    Mild confusion overnight Breathing slightly improved HR improved  On exam: irreg irreg, not  tachycardic Diminished left base, otherwise clear   Impression/Plan: Large R femoral DVT Acute pulmonary embolism, provoked by recent hospitalization > transition from heparin to Xarelto, monitor for bleeding carefully; given normal echocardiogram and lack of mobility in DVT I see no role for IVC filter.  Will need 6 months of Xarelto. Left pleural effusion> small, plan repeat CXR  Acute respiratory failure with hypoxemia > improved, wean off O2 Mobilize today  Roselie Awkward, MD [facility] Pager: [phone] Cell: [phone] After 3pm or if no response, call [phone]

## 2016-06-21 DIAGNOSIS — I82411 Acute embolism and thrombosis of right femoral vein: Secondary | ICD-10-CM

## 2016-06-21 DIAGNOSIS — K5731 Diverticulosis of large intestine without perforation or abscess with bleeding: Secondary | ICD-10-CM

## 2016-06-21 DIAGNOSIS — Z789 Other specified health status: Secondary | ICD-10-CM

## 2016-06-21 DIAGNOSIS — J90 Pleural effusion, not elsewhere classified: Secondary | ICD-10-CM

## 2016-06-21 LAB — BASIC METABOLIC PANEL
Anion gap: 9 (ref 5–15)
BUN: 22 mg/dL — AB (ref 6–20)
CALCIUM: 9.3 mg/dL (ref 8.9–10.3)
CHLORIDE: 111 mmol/L (ref 101–111)
CO2: 21 mmol/L — AB (ref 22–32)
CREATININE: 0.69 mg/dL (ref 0.44–1.00)
GFR calc Af Amer: >60 mL/min (ref >60)
GFR calc non Af Amer: >60 mL/min (ref >60)
GLUCOSE: 144 mg/dL — AB (ref 65–99)
Potassium: 4.1 mmol/L (ref 3.5–5.1)
Sodium: 141 mmol/L (ref 135–145)

## 2016-06-21 LAB — CBC
HCT: 34.4 % — ABNORMAL LOW (ref 36.0–46.0)
Hemoglobin: 10.8 g/dL — ABNORMAL LOW (ref 12.0–15.0)
MCH: 27.9 pg (ref 26.0–34.0)
MCHC: 31.4 g/dL (ref 30.0–36.0)
MCV: 88.9 fL (ref 78.0–100.0)
PLATELETS: 252 10*3/uL (ref 150–400)
RBC: 3.87 MIL/uL (ref 3.87–5.11)
RDW: 15.9 % — AB (ref 11.5–15.5)
WBC: 8.1 10*3/uL (ref 4.0–10.5)

## 2016-06-21 MED ORDER — OLANZAPINE 5 MG PO TBDP
5.0000 mg | ORAL_TABLET | Freq: Every day | ORAL | Status: DC
  Administered 2016-06-21 – 2016-06-23 (×3): 5 mg via ORAL
  Filled 2016-06-21 (×5): qty 1

## 2016-06-21 MED ORDER — OLANZAPINE 5 MG PO TBDP
5.0000 mg | ORAL_TABLET | Freq: Two times a day (BID) | ORAL | Status: DC | PRN
  Administered 2016-06-21 – 2016-06-25 (×3): 5 mg via ORAL
  Filled 2016-06-21 (×4): qty 1

## 2016-06-21 NOTE — Clinical Social Work Note (Signed)
Clinical Social Work Assessment  Patient Details  Name: [name] MRN: [id number] Date of Birth: [birth year]  Date of referral:  06/21/16               Reason for consult:  Facility Placement                Permission sought to share information with:  Family Supports Permission granted to share information::  Yes, Verbal Permission Granted  Name::        Agency::  [employer]  Relationship::     Contact Information:  [name] Schroll: Granddaughter [phone]  Housing/Transportation Living arrangements for the past 2 months:  [place] of Information:  Patient, Other (Comment Required) (Granddaughter) Patient Interpreter Needed:  None Criminal Activity/Legal Involvement Pertinent to Current Situation/Hospitalization:    Significant Relationships:  Other(Comment) (Granddaughter) Lives with:  Facility Resident Do you feel safe going back to the place where you live?  Yes Need for family participation in patient care:  Yes (Comment)  Care giving concerns:  Patient expressed she fell while at ALF and injured herself. Patient reports she has lived at the ALF for about a year now. Patient reports she would like to return there if she is able once discharged. Patient reports that her Granddaughter Keenan Bachelor is her caretaker and reports no other family members. Palliative Care has been consulted for patient at this time.    Social Worker assessment / plan:  LCSWA met with patient at bedside. Patient seemed confused at times but was able to provide information during the assessment. LCSWA attempted to contact patient granddaughter for collateral information, no answer therefore left voicemail. LCSWA discussed with patient, MD and physical therapy helping to determine her disposition.   Assist with disposition once determined.   Employment status:  Retired Forensic scientist:  Medicare PT Recommendations:  Not assessed at this time Information /  Referral to community resources:     Patient/Family's Response to care: Patient is agreeable with disposition. Granddaughter not reachable at this time via phone. Will continue to call.   Patient/Family's Understanding of and Emotional Response to Diagnosis, Current Treatment, and Prognosis:  Patient is confused about her health at this time, she has little understanding about her health. She reports, "I have blood clots but I been to [place] so many times you can get my information."  Emotional Assessment Appearance:    Attitude/Demeanor/Rapport:   (Cooperative, Confused) Affect (typically observed):  Accepting, Pleasant, Calm Orientation:    Alcohol / Substance use:  Not Applicable Psych involvement (Current and /or in the community):  No (Comment)  Discharge Needs  Concerns to be addressed:  Care Coordination Readmission within the last 30 days:    Current discharge risk:  None Barriers to Discharge:  No Barriers Identified   Lia Hopping, LCSW 06/21/2016, 3:46 PM

## 2016-06-21 NOTE — Progress Notes (Signed)
Patient's attempts at using bedpan did not yield any urine, plan scan completed, showed > 400 ml urine in her bladder, In and out catherization completed per protocol- yielded 561ml amber colored urine. Patient tolerated procedure ok.

## 2016-06-21 NOTE — Progress Notes (Signed)
Name: [name] MRN: [id number] DOB: 1922/07/30    ADMISSION DATE:  06/08/2016 CONSULTATION DATE:  06/03/2016  REFERRING MD :  Dr. Winfred Leeds  CHIEF COMPLAINT:  SOB / PE   BRIEF SUMMARY:  82 y/o F, SNF resident, with PMH of PAF (not on anticoagulation) and recent GIB related to diverticulosis (6/25 discharge) admitted with SOB in the setting of PE.  RV/LV ratio of 1.7.  Heparin gtt initiated.  Transitioned to [facility] 7/25.   SUBJECTIVE:  RN reports pt tolerated weaning of O2 well, pt mostly oriented during the day but at 3pm, she became anxious / agitated / tachypneic in the 40's.  Concerned for sundowning.  Returned to [facility].    VITAL SIGNS: Temp:  [97.5 F (36.4 C)-98.3 F (36.8 C)] 98.3 F (36.8 C) (07/26 0400) Pulse Rate:  [76-115] 102 (07/26 0700) Resp:  [17-33] 25 (07/26 0700) BP: (103-157)/(57-122) 125/72 (07/26 0700) SpO2:  [91 %-100 %] 91 % (07/26 0700)  PHYSICAL EXAMINATION: General:  Well developed elderly female in NAD  Neuro:  Arouses to voice, repetitive in conversation, mild confusion, MAE, R pupil irregular 40mm (prior surgery), L 2-3 mm HEENT:  Mm pink/moist, no jvd  Cardiovascular:  s1s2 irr irr, AF on monitor, no m/r/g  Lungs:  Even/non-labored, lungs bilaterally clear  Abdomen:  Obese/soft, bsx4 active  Musculoskeletal:  No acute deformities  Skin:  Warm/dry, trace BLE edema R>L   Recent Labs Lab 06/22/2016 0930 06/20/16 0629 06/21/16 0335  NA 143 141 141  K 3.0* 3.9 4.1  CL 106 114* 111  CO2  --  16* 21*  BUN 15 16 22*  CREATININE 0.80 0.68 0.69  GLUCOSE 146* 166* 144*    Recent Labs Lab 06/10/2016 0924 05/27/2016 0930 06/20/16 0629 06/21/16 0335  HGB 10.5* 11.6* 10.8* 10.8*  HCT 33.0* 34.0* 34.4* 34.4*  WBC 8.8  --  8.6 8.1  PLT 281  --  263 252   Dg Chest 2 View  Result Date: 06/18/2016 CLINICAL DATA:  Shortness of Breath EXAM: CHEST  2 VIEW COMPARISON:  June 10, 2015 FINDINGS: There is left lower lobe airspace consolidation with  small left effusion. Lungs elsewhere clear. There is cardiomegaly with evidence a degree of pulmonary venous hypertension. There is atherosclerotic calcification in the aorta. Patient is status post left mastectomy with surgical clips in left axilla. No adenopathy. No bone lesions. IMPRESSION: Left lower lobe airspace consolidation with small left pleural effusion. Lungs elsewhere clear. Evidence of pulmonary vascular congestion. There is aortic atherosclerosis. Electronically Signed   By: Lowella Grip III M.D.   On: 06/05/2016 09:19  Ct Angio Chest Pe W Or Wo Contrast  Result Date: 06/18/2016 CLINICAL DATA:  Shortness of Breath. History of left-sided breast carcinoma EXAM: CT ANGIOGRAPHY CHEST WITH CONTRAST TECHNIQUE: Multidetector CT imaging of the chest was performed using the standard protocol during bolus administration of intravenous contrast. Multiplanar CT image reconstructions and MIPs were obtained to evaluate the vascular anatomy. CONTRAST:  100 mL Isovue 370 nonionic COMPARISON:  Chest radiograph June 19, 2016 FINDINGS: Cardiovascular: There is extensive pulmonary embolus arising from the distal main pulmonary arteries bilaterally, extensive bilaterally,with extension into multiple upper and lower lobe pulmonary arterial branches, more pronounced on the right than on the left. The right ventricular to left ventricular diameter ratio is 1.7, consistent with right heart strain. The ascending thoracic aorta diameter measures 4.0 x 4.0 cm. There is no appreciable thoracic aortic dissection. There is atherosclerotic calcification throughout the aorta. The  visualized great vessels appear normal except for mild scattered foci of atherosclerotic calcification. The right and left common carotid arteries arise as a common trunk, an anatomic variant. There is coronary artery calcification in the left anterior descending coronary artery. There is a small pericardial effusion. Mediastinum/Nodes: Thyroid  appears diminutive. There is no appreciable thoracic adenopathy. Lungs/Pleura: There is a moderate pleural effusion on the left with left lower lobe consolidation. There is fatty prominence along the right posterior pleura with mild pleural thickening posteriorly in this area. There is no edema or consolidation on the right. Upper Abdomen: Contrast is reflux into the inferior vena cava and hepatic veins. There is atherosclerotic calcification in the upper abdominal aorta. Visualized upper abdominal structures otherwise appear normal. Musculoskeletal: There is degenerative change in the thoracic spine. No blastic or lytic bone lesions. Patient is status post mastectomy on the left. There are surgical clips in left axillary region. Review of the MIP images confirms the above findings. IMPRESSION: Positive for acute PE with CT evidence of right heart strain (RV/LV Ratio = 1.7) consistent with at least submassive (intermediate risk) PE. The presence of right heart strain has been associated with an increased risk of morbidity and mortality. Please activate Code PE by paging [phone]. Pulmonary emboli are extensive bilaterally with extensive involving both main pulmonary arteries as well as multiple peripheral branches. Reflux of contrast into the inferior vena cava and hepatic veins is consistent with elevated right heart pressure. Prominence of the ascending thoracic aorta with a measured diameter 4.0 x 4.0 cm. Recommend annual imaging followup by CTA or MRA. This recommendation follows 2010 ACCF/AHA/AATS/ACR/ASA/SCA/SCAI/SIR/STS/SVM Guidelines for the Diagnosis and Management of Patients with Thoracic Aortic Disease. Circulation. 2010; 121SP:1689793. Moderate pleural effusion on the left with left lower lobe consolidation. Mild posterior pleural thickening on the right without edema or consolidation on the right. Small pericardial effusion. Multiple foci of coronary artery calcification. There is atherosclerotic  calcification in the aorta. Status post left mastectomy. Critical Value/emergent results were called by telephone at the time of interpretation on 06/18/2016 at 11:33 am to Dr. Orlie Dakin , who verbally acknowledged these results. Electronically Signed   By: Lowella Grip III M.D.   On: 06/08/2016 11:35  Dg Chest Port 1 View  Result Date: 06/20/2016 CLINICAL DATA:  82 year old female with acute pulmonary embolus, shortness of breath and acute respiratory failure. EXAM: PORTABLE CHEST 1 VIEW COMPARISON:  06/04/2016 chest CT and chest radiograph. FINDINGS: Cardiomegaly and prominent central pulmonary arteries again noted. Left lower lobe atelectasis and left pleural effusion again noted. There is no evidence of pneumothorax or pulmonary edema. There has been little interval change since the prior study. Surgical clips overlying the left chest/axilla noted. IMPRESSION: Unchanged appearance of the chest with left lower lung atelectasis, left pleural effusion, cardiomegaly and prominent central pulmonary arteries. Electronically Signed   By: Margarette Canada M.D.   On: 06/20/2016 10:45  SIGNIFICANT EVENTS  7/24  Admit with SOB, work up positive for PE with RV/LV ratio of 1.7 7/25  Transitioned to Xarelto   STUDIES:  CTA Chest 7/24 >> acute PE with evidence of right heart strain with RV/LV ratio of 1.7, extensive PE bilaterally involving both main pulmonary arteries as well as peripheral branches LE Doppler 7/24 >> acute DVT in R profunda femoris vein, R popliteal vein, R posterior tibial vein, R peroneal vein.  Neg on Left. ECHO 7/24 >> mild LVH, LVEF 50-55%, normal RV size / function, trace L pleural effusion, mild  AR, MR.  Mildly dilated RA  ASSESSMENT / PLAN:  Acute PE - submassive, RV/LV ratio of 1.7 on admit, likely provoked with recent admit for GIB / bedrest period RLE Swelling / Acute DVT  Small Left Pleural Effusion Recent Lower GIB - in setting of diverticulosis  Atrial Fibrillation -  rate controlled on diltiazem  Plan: SDU monitoring  Heparin gtt per pharmacy for PE > will have to cautiously monitor her on anticoagulation Not a candidate for TPA (not hemodynamically unstable to warrant) or EKOS intervention  O2 to keep sats > 92%, work on weaning  If she does not tolerate anticoagulation from a bleeding standpoint, she will need IVC filter placement Bedrest until heparin level therapeutic  DNR/DNI Consider palliative care consultation  Pulmonary hygiene as able Likely can transition off IV cardizem to oral, will defer to primary MD    Noe Gens, NP-C [place] Pgr: [phone] or if no answer [phone] 06/21/2016, 8:16 AM

## 2016-06-21 NOTE — Progress Notes (Signed)
PROGRESS NOTE    [name]  [id number] DOB: [birth year] DOA: 06/22/2016 PCP: [name], MD  Outpatient Specialists:   Brief Narrative: 82 y.o. female with medical history significant of history of A. fib (not on anticoagulations), remote history of left-sided breast cancer, hypertension, GERD and recent lower GI bleed (discharge from the hospital about a month ago for GI bleed).  Patient sent from her SNF because of SOB. She recalled transient SOB over the past few days but this morning she had very severe shortness of breath so she was brought to the hospital for further evaluation she was tachycardic as well. Upon initial evaluation in the ED she was found to have A. fib with RVR, initial blood pressure was 86/69 so she was given 500 mL of normal saline, patient currently very hypoxic and she is on a nonrebreather mask satting in the low 90s. CT scan showed extensive PE bilaterally. Doppler ultrasound revealed extensive DVT of right lower extremity. Critical input is appreciated. Also discussed with Hematology, Dr. Earlie Server and current management was recommended (Heparin for now).  Assessment & Plan:   Principal Problem:   Pulmonary emboli (HCC)   Extensive DVT Right lower extremity  Active Problems:   GI bleed, recent (about a month prior to presentation)   Diverticulosis of colon with hemorrhage   Acute respiratory failure with hypoxia (HCC)   Atrial fibrillation with RVR (HCC)   Pressure ulcer  Pulmonary embolism -Bilateral extensive pulmonary embolism (at least submassive) - Now on Xarelto (As per the Critical team). Critical care team input is appreciated.  -Palliative care consulted. -Patient remains significantly hypoxic on 8L of supplemental oxygen via nasal canula - Guarded prognosis.  Atrial fibrillation with RVR - Heart rate is now controlled. -Has known paroxysmal atrial fibrillation, came in with RVR likely secondary to hypoxia/PE. - On cardizem  drip, and PRN IV Lopressor.  Acute respiratory failure with hypoxia -Currently on supplemental oxygen 8L/Min via nasal canula. . -Continue oxygen supplementation. -Patient is DNR/DNI.  Recent GI bleed with diverticulosis of the colon -Patient was recently in the hospital discharge on 05/21/2016 for lower GI bleed which presumed to be diverticular. -Patient will be on anticoagulation will follow closely for any evidence of bleeding. Patient is high risk. Discussed with Critical Team and Hematologist.  History of left-sided breast cancer -Status post mastectomy in 1979, no history of recurrence that she knows of.  DVT prophylaxis: Therapeutic heparin Code Status: DNR Family Communication:   Disposition Plan: Back to skilled nursing facility Consults called: Hematology. Critical care is already following the patient Admission status: SDU, inpatient  Subjective: Nil new complaints. Seen alongside patient's Nurse. No fever or chills. No chest pain. Comfortable.  Objective: Vitals:   06/21/16 0400 06/21/16 0500 06/21/16 0600 06/21/16 0700  BP: 129/62 108/67 123/70 125/72  Pulse: (!) 103 92 100 (!) 102  Resp: 20 19 19  (!) 25  Temp: 98.3 F (36.8 C)     TempSrc: Axillary     SpO2: 96% 91% 95% 91%  Weight:      Height:        Intake/Output Summary (Last 24 hours) at 06/21/16 0754 Last data filed at 06/21/16 0600  Gross per 24 hour  Intake          1240.92 ml  Output              650 ml  Net           590.92 ml   [employer]  06/11/2016 0852 06/20/2016 1335  Weight: 55.8 kg (123 lb) 57.5 kg (126 lb 12.2 oz)    Examination:  General exam: Appears calm and comfortable. On supplemental oxygen. Respiratory system: Clear to auscultation.   Cardiovascular system: S1 & S2 heard, . Gastrointestinal system: Abdomen is nondistended, soft and nontender.  Central nervous system: Alert and oriented. Moves all limbs. Extremities: Minimal swelling RLE. Data Reviewed: I have  personally reviewed following labs and imaging studies  CBC:  Recent Labs Lab 06/13/2016 0924 06/04/2016 0930 06/20/16 0629 06/21/16 0335  WBC 8.8  --  8.6 8.1  NEUTROABS 7.1  --   --   --   HGB 10.5* 11.6* 10.8* 10.8*  HCT 33.0* 34.0* 34.4* 34.4*  MCV 87.3  --  88.2 88.9  PLT 281  --  263 [id number]   Basic Metabolic Panel:  Recent Labs Lab 05/29/2016 0930 06/20/16 0629 06/21/16 0335  NA 143 141 141  K 3.0* 3.9 4.1  CL 106 114* 111  CO2  --  16* 21*  GLUCOSE 146* 166* 144*  BUN 15 16 22*  CREATININE 0.80 0.68 0.69  CALCIUM  --  9.1 9.3   GFR: Estimated Creatinine Clearance: 37.1 mL/min (by C-G formula based on SCr of 0.8 mg/dL). Liver Function Tests: No results for input(s): AST, ALT, ALKPHOS, BILITOT, PROT, ALBUMIN in the last 168 hours. No results for input(s): LIPASE, AMYLASE in the last 168 hours. No results for input(s): AMMONIA in the last 168 hours. Coagulation Profile:  Recent Labs Lab 06/04/2016 0924 06/05/2016 1503  INR 1.17 1.20   Cardiac Enzymes:  Recent Labs Lab 06/05/2016 1333 06/18/2016 1855 06/20/16 0112  TROPONINI 0.06* 0.06* 0.06*   BNP (last 3 results) No results for input(s): PROBNP in the last 8760 hours. HbA1C: No results for input(s): HGBA1C in the last 72 hours. CBG: No results for input(s): GLUCAP in the last 168 hours. Lipid Profile: No results for input(s): CHOL, HDL, LDLCALC, TRIG, CHOLHDL, LDLDIRECT in the last 72 hours. Thyroid Function Tests: No results for input(s): TSH, T4TOTAL, FREET4, T3FREE, THYROIDAB in the last 72 hours. Anemia Panel: No results for input(s): VITAMINB12, FOLATE, FERRITIN, TIBC, IRON, RETICCTPCT in the last 72 hours. Urine analysis:    Component Value Date/Time   COLORURINE YELLOW 12/20/2015 1103   APPEARANCEUR CLEAR 12/20/2015 1103   LABSPEC 1.007 12/20/2015 1103   PHURINE 6.0 12/20/2015 1103   GLUCOSEU NEGATIVE 12/20/2015 1103   HGBUR NEGATIVE 12/20/2015 1103   BILIRUBINUR NEGATIVE 12/20/2015 1103    KETONESUR NEGATIVE 12/20/2015 1103   PROTEINUR NEGATIVE 12/20/2015 1103   UROBILINOGEN 0.2 12/27/2014 1020   NITRITE NEGATIVE 12/20/2015 1103   LEUKOCYTESUR NEGATIVE 12/20/2015 1103   Sepsis Labs: @LABRCNTIP (procalcitonin:4,lacticidven:4)  ) Recent Results (from the past 240 hour(s))  MRSA PCR Screening     Status: None   Collection Time: 05/29/2016  5:00 PM  Result Value Ref Range Status   MRSA by PCR NEGATIVE NEGATIVE Final    Comment:        The GeneXpert MRSA Assay (FDA approved for NASAL specimens only), is one component of a comprehensive MRSA colonization surveillance program. It is not intended to diagnose MRSA infection nor to guide or monitor treatment for MRSA infections.          Radiology Studies: Dg Chest 2 View  Result Date: 06/08/2016 CLINICAL DATA:  Shortness of Breath EXAM: CHEST  2 VIEW COMPARISON:  June 10, 2015 FINDINGS: There is left lower lobe airspace consolidation with small left effusion. Lungs  elsewhere clear. There is cardiomegaly with evidence a degree of pulmonary venous hypertension. There is atherosclerotic calcification in the aorta. Patient is status post left mastectomy with surgical clips in left axilla. No adenopathy. No bone lesions. IMPRESSION: Left lower lobe airspace consolidation with small left pleural effusion. Lungs elsewhere clear. Evidence of pulmonary vascular congestion. There is aortic atherosclerosis. Electronically Signed   By: Lowella Grip III M.D.   On: 06/23/2016 09:19  Ct Angio Chest Pe W Or Wo Contrast  Result Date: 06/10/2016 CLINICAL DATA:  Shortness of Breath. History of left-sided breast carcinoma EXAM: CT ANGIOGRAPHY CHEST WITH CONTRAST TECHNIQUE: Multidetector CT imaging of the chest was performed using the standard protocol during bolus administration of intravenous contrast. Multiplanar CT image reconstructions and MIPs were obtained to evaluate the vascular anatomy. CONTRAST:  100 mL Isovue 370 nonionic  COMPARISON:  Chest radiograph June 19, 2016 FINDINGS: Cardiovascular: There is extensive pulmonary embolus arising from the distal main pulmonary arteries bilaterally, extensive bilaterally,with extension into multiple upper and lower lobe pulmonary arterial branches, more pronounced on the right than on the left. The right ventricular to left ventricular diameter ratio is 1.7, consistent with right heart strain. The ascending thoracic aorta diameter measures 4.0 x 4.0 cm. There is no appreciable thoracic aortic dissection. There is atherosclerotic calcification throughout the aorta. The visualized great vessels appear normal except for mild scattered foci of atherosclerotic calcification. The right and left common carotid arteries arise as a common trunk, an anatomic variant. There is coronary artery calcification in the left anterior descending coronary artery. There is a small pericardial effusion. Mediastinum/Nodes: Thyroid appears diminutive. There is no appreciable thoracic adenopathy. Lungs/Pleura: There is a moderate pleural effusion on the left with left lower lobe consolidation. There is fatty prominence along the right posterior pleura with mild pleural thickening posteriorly in this area. There is no edema or consolidation on the right. Upper Abdomen: Contrast is reflux into the inferior vena cava and hepatic veins. There is atherosclerotic calcification in the upper abdominal aorta. Visualized upper abdominal structures otherwise appear normal. Musculoskeletal: There is degenerative change in the thoracic spine. No blastic or lytic bone lesions. Patient is status post mastectomy on the left. There are surgical clips in left axillary region. Review of the MIP images confirms the above findings. IMPRESSION: Positive for acute PE with CT evidence of right heart strain (RV/LV Ratio = 1.7) consistent with at least submassive (intermediate risk) PE. The presence of right heart strain has been associated with  an increased risk of morbidity and mortality. Please activate Code PE by paging [phone]. Pulmonary emboli are extensive bilaterally with extensive involving both main pulmonary arteries as well as multiple peripheral branches. Reflux of contrast into the inferior vena cava and hepatic veins is consistent with elevated right heart pressure. Prominence of the ascending thoracic aorta with a measured diameter 4.0 x 4.0 cm. Recommend annual imaging followup by CTA or MRA. This recommendation follows 2010 ACCF/AHA/AATS/ACR/ASA/SCA/SCAI/SIR/STS/SVM Guidelines for the Diagnosis and Management of Patients with Thoracic Aortic Disease. Circulation. 2010; 121SP:1689793. Moderate pleural effusion on the left with left lower lobe consolidation. Mild posterior pleural thickening on the right without edema or consolidation on the right. Small pericardial effusion. Multiple foci of coronary artery calcification. There is atherosclerotic calcification in the aorta. Status post left mastectomy. Critical Value/emergent results were called by telephone at the time of interpretation on 06/11/2016 at 11:33 am to Dr. Orlie Dakin , who verbally acknowledged these results. Electronically Signed   By: Gwyndolyn Saxon  Jasmine December III M.D.   On: 06/14/2016 11:35  Dg Chest Port 1 View  Result Date: 06/20/2016 CLINICAL DATA:  82 year old female with acute pulmonary embolus, shortness of breath and acute respiratory failure. EXAM: PORTABLE CHEST 1 VIEW COMPARISON:  06/10/2016 chest CT and chest radiograph. FINDINGS: Cardiomegaly and prominent central pulmonary arteries again noted. Left lower lobe atelectasis and left pleural effusion again noted. There is no evidence of pneumothorax or pulmonary edema. There has been little interval change since the prior study. Surgical clips overlying the left chest/axilla noted. IMPRESSION: Unchanged appearance of the chest with left lower lung atelectasis, left pleural effusion, cardiomegaly and prominent  central pulmonary arteries. Electronically Signed   By: Margarette Canada M.D.   On: 06/20/2016 10:45       Scheduled Meds: . antiseptic oral rinse  7 mL Mouth Rinse BID  . fluticasone furoate-vilanterol  1 puff Inhalation Daily  . pantoprazole  40 mg Oral Daily  . Rivaroxaban  15 mg Oral BID WC  . [START ON 07/11/2016] rivaroxaban  20 mg Oral Q supper  . sodium chloride flush  3 mL Intravenous Q12H   Continuous Infusions: . sodium chloride 10 mL (06/20/16 1952)  . diltiazem (CARDIZEM) infusion 10 mg/hr (06/21/16 0600)     LOS: 2 days    Time spent: 10 Minutes    Dana Allan, MD  [facility] Pager #: [phone] 7PM-7AM contact night coverage as above

## 2016-06-21 NOTE — Progress Notes (Signed)
I spoke with [name]. She has a very close relationship with her grandmother and her grandmother is really her last living close blood relative, "there no body left when she dies". She has not been able to come to the hospital but plans on coming as soon as possible. She reports her grandmother was at [facility] prior to admission, she was ambulatory walking to and from the ining room but had a fall recently with extensive bruising.   We talked about how fragile her condition is given the PEs. If she survives hospitalization [name] hopes for her to return to [facility] with more help -possibly even with skilled care. I discussed hospice. She is considering options. For now goal is to treat and reverse the delirium ASAP, treat the PE with standard of care, mobilize asap, focus on comfort in addition to active treatment. Heart strain causing A.fib with RVR-orals when possible.  Will follow and assist with care coordination.  Julie Hacker, DO Palliative Medicine

## 2016-06-21 NOTE — Consult Note (Signed)
Consultation Note Date: 06/21/2016   Patient Name: [name]  DOB: 1922/08/25  MRN: [id number]  Age / Sex: 82 y.o., female  PCP: Unk Pinto, MD Referring Physician: Bonnell Public, MD  Reason for Consultation: Establishing goals of care  HPI/Patient Profile: 82 y.o. female, retired Network engineer with [employer]  with past medical history of A.fib, Anticoagulation induced GIB, failure to thrive, falls, and possible mild vascular dementia  admitted on 06/06/2016 from SNF with respiratory failure and found to have large bilateral PE. Hospital course complicated by delirium and risk/beneifits of anticoagulation.  Clinical Assessment and Goals of Care: [name] is showing signs of decline-frequent hospitalizations, loss of functional status and now life threatening PE's off anticoagulation. She has a DNR, she is in the ICU and having problems with deliirum -likely multi-factorial ICU induced, urinary retension and underlying cardio-pulmonary issues. Her children are deceased. She relies on her [name] and [name] to make her decisions and help her with healthcare needs.  HCPOA-[name]-[name]    SUMMARY OF RECOMMENDATIONS   For now delirium is her main issue and we need to update her family on her condition. She remains fragile.   Start Xyprexa 5mg  q12 PRN and QHS for agitation  Foley for urine retention  Minimize ICU interventions and transfer as soon as possible- monitor sleep -standard nursing interventions and reorientation for acute delirium  She has not had a bowel movement-anticoagulation was restarted so monitor closely for GIB -start laxative and SS  Code Status/Advance Care Planning:  DNR    Symptom Management:   As above-we can consider giving a benzodiazapine for severe anxiety if increased dose of xyprexa does not work  Schedule Tylenol for  pain -she may not be able to articulate discomfort and pain can drive delirium   Palliative Prophylaxis:   Aspiration, bowel regimen    Additional Recommendations (Limitations, Scope, Preferences):  Full Scope Treatment  Psycho-social/Spiritual:   Desire for further Chaplaincy support:no  Additional Recommendations: Education on Hospice  Prognosis:   < 4 weeks  Discharge Planning: To Be Determined      Primary Diagnoses: Present on Admission: . Bilateral pulmonary embolism ([place]) . Acute respiratory failure with hypoxia ([place]) . Atrial fibrillation with RVR ([place]) . Diverticulosis of colon with hemorrhage . GI bleed   I have reviewed the medical record, interviewed the patient and family, and examined the patient. The following aspects are pertinent.  Past Medical History:  Diagnosis Date  . Cancer [facility]) 1979 mastectomy  . Cataract   . Elevated hemoglobin A1c   . GERD (gastroesophageal reflux disease)   . Glaucoma    Right pupil (about 33mm) is larger than left pupil (about 41mm) due to surgery in 1973.  Amblyopia in right eye.  Marland Kitchen Hyperlipidemia   . Hypertension   . Macular degeneration   . Meningitis    Social History   Social History  . Marital status: Widowed    Spouse name: N/A  . Number of children: N/A  . Years of education: N/A  Social History Main Topics  . Smoking status: Never Smoker  . Smokeless tobacco: Never Used  . Alcohol use No  . Drug use: No  . Sexual activity: No   Other Topics Concern  . None   Social History Narrative  . None   Family History  Problem Relation Age of Onset  . Hypertension Mother   . Hypertension Father   . CVA Father   . Arthritis Sister     Rheumatoid  . Heart attack Brother   . Heart disease Brother   . Diabetes Daughter   . Hypertension Daughter    Scheduled Meds: . antiseptic oral rinse  7 mL Mouth Rinse BID  . fluticasone furoate-vilanterol  1 puff Inhalation Daily  . OLANZapine zydis  5 mg Oral  QHS  . pantoprazole  40 mg Oral Daily  . Rivaroxaban  15 mg Oral BID WC  . [START ON 07/11/2016] rivaroxaban  20 mg Oral Q supper  . sodium chloride flush  3 mL Intravenous Q12H   Continuous Infusions: . sodium chloride 10 mL (06/20/16 1952)  . diltiazem (CARDIZEM) infusion 10 mg/hr (06/21/16 0600)   PRN Meds:.acetaminophen **OR** acetaminophen, HYDROcodone-acetaminophen, LORazepam, metoprolol, morphine injection, OLANZapine zydis, ondansetron **OR** ondansetron (ZOFRAN) IV Medications Prior to Admission:  Prior to Admission medications   Medication Sig Start Date End Date Taking? Authorizing Provider  acetaminophen (TYLENOL) 500 MG tablet Take 1,000 mg by mouth every 8 (eight) hours as needed for moderate pain.   Yes Historical Provider, MD  atenolol (TENORMIN) 100 MG tablet Take 1 tablet (100 mg total) by mouth daily. 03/21/16  Yes Unk Pinto, MD  B Complex-C (B-COMPLEX WITH VITAMIN C) tablet Take 1 tablet by mouth daily.     Yes Historical Provider, MD  bumetanide (BUMEX) 2 MG tablet TAKE 1 TABLET BY MOUTH  DAILY FOR BLOOD PRESSURE  AND FLUID 05/29/15  Yes Unk Pinto, MD  Cholecalciferol (VITAMIN D3) 2000 units TABS Take 8,000 Units by mouth daily.   Yes Historical Provider, MD  cholestyramine (QUESTRAN) 4 g packet Take 1 packet by mouth 2 (two) times daily. 01/20/16 01/19/17 Yes Courtney Forcucci, PA-C  dorzolamide-timolol (COSOPT) 22.3-6.8 MG/ML ophthalmic solution Place 1 drop into the left eye 2 (two) times daily.  12/03/14  Yes Historical Provider, MD  DUREZOL 0.05 % EMUL Place 1 drop into the right eye 2 (two) times daily.  12/16/15  Yes Historical Provider, MD  fluticasone (FLONASE) 50 MCG/ACT nasal spray Place 2 sprays into both nostrils daily. 06/06/16  Yes Courtney Forcucci, PA-C  fluticasone furoate-vilanterol (BREO ELLIPTA) 100-25 MCG/INH AEPB Inhale 1 puff into the lungs daily. Rinse mouth after use.  Use until acute upper respiratory infection resolves. 06/06/16  Yes Courtney  Forcucci, PA-C  isosorbide mononitrate (IMDUR) 30 MG 24 hr tablet Take 1 tablet by mouth  every night at bedtime 06/01/15  Yes Unk Pinto, MD  latanoprost (XALATAN) 0.005 % ophthalmic solution Place 1 drop into the left eye at bedtime.    Yes Historical Provider, MD  loratadine (CLARITIN) 10 MG tablet Take 1 tablet (10 mg total) by mouth daily. 06/06/16 06/06/17 Yes Courtney Forcucci, PA-C  losartan (COZAAR) 100 MG tablet Take one-half tablet by  mouth twice a day for blood pressure 05/29/15  Yes Unk Pinto, MD  Pancrelipase, Lip-Prot-Amyl, 6000 units CPEP Take 1 capsule (6,000 Units total) by mouth 3 (three) times daily before meals. 01/25/16  Yes Courtney Forcucci, PA-C  polyethylene glycol (MIRALAX / GLYCOLAX) packet Take 17 g by mouth  daily as needed for mild constipation (take 1 packet per day until a bowel movement is acheived.). Patient taking differently: Take 17 g by mouth daily.  06/06/16  Yes Courtney Forcucci, PA-C  Zinc 50 MG TABS Take 50 mg by mouth daily.    Yes Historical Provider, MD   Allergies  Allergen Reactions  . Lactose Intolerance (Gi) Other (See Comments)    unknown   Review of Systems  Physical Exam  Vital Signs: BP (!) 120/91   Pulse 86   Temp 97.6 F (36.4 C) (Axillary)   Resp (!) 24   Ht 5\' 3"  (1.6 m)   Wt 57.5 kg (126 lb 12.2 oz)   SpO2 96%   BMI 22.46 kg/m  Pain Assessment: 0-10   Pain Score: Asleep   SpO2: SpO2: 96 % O2 Device:SpO2: 96 % O2 Flow Rate: .O2 Flow Rate (L/min): 9 L/min  IO: Intake/output summary:  Intake/Output Summary (Last 24 hours) at 06/21/16 1327 Last data filed at 06/21/16 1300  Gross per 24 hour  Intake          1405.54 ml  Output              685 ml  Net           720.54 ml    LBM:   Baseline Weight: Weight: 55.8 kg (123 lb) Most recent weight: Weight: 57.5 kg (126 lb 12.2 oz)     Palliative Assessment/Data:30      Time In: 1PM Time Out: 1:50PM Time Total: 50 minutes Greater than 50%  of this time was  spent counseling and coordinating care related to the above assessment and plan.  Signed by: Lane Hacker, DO   Please contact Palliative Medicine Team phone at [phone] for questions and concerns.  For individual provider: See Shea Evans

## 2016-06-22 LAB — CBC
HEMATOCRIT: 32.9 % — AB (ref 36.0–46.0)
HEMOGLOBIN: 10.3 g/dL — AB (ref 12.0–15.0)
MCH: 27.9 pg (ref 26.0–34.0)
MCHC: 31.3 g/dL (ref 30.0–36.0)
MCV: 89.2 fL (ref 78.0–100.0)
Platelets: 296 10*3/uL (ref 150–400)
RBC: 3.69 MIL/uL — AB (ref 3.87–5.11)
RDW: 15.9 % — ABNORMAL HIGH (ref 11.5–15.5)
WBC: 9.4 10*3/uL (ref 4.0–10.5)

## 2016-06-22 LAB — BASIC METABOLIC PANEL
Anion gap: 9 (ref 5–15)
Anion gap: 7 (ref 5–15)
BUN: 22 mg/dL — ABNORMAL HIGH (ref 6–20)
BUN: 21 mg/dL — ABNORMAL HIGH (ref 6–20)
CALCIUM: 9.2 mg/dL (ref 8.9–10.3)
CO2: 22 mmol/L (ref 22–32)
CO2: 25 mmol/L (ref 22–32)
Calcium: 9.2 mg/dL (ref 8.9–10.3)
Chloride: 110 mmol/L (ref 101–111)
Chloride: 109 mmol/L (ref 101–111)
Creatinine, Ser: 0.67 mg/dL (ref 0.44–1.00)
Creatinine, Ser: 0.53 mg/dL (ref 0.44–1.00)
GFR calc Af Amer: >60 mL/min (ref >60)
GFR calc Af Amer: >60 mL/min (ref >60)
GFR calc non Af Amer: >60 mL/min (ref >60)
GLUCOSE: 147 mg/dL — AB (ref 65–99)
Glucose, Bld: 131 mg/dL — ABNORMAL HIGH (ref 65–99)
Potassium: 3.4 mmol/L — ABNORMAL LOW (ref 3.5–5.1)
Potassium: 3.5 mmol/L (ref 3.5–5.1)
Sodium: 141 mmol/L (ref 135–145)
Sodium: 141 mmol/L (ref 135–145)

## 2016-06-22 NOTE — Discharge Instructions (Signed)
Information on my medicine - XARELTO (rivaroxaban)  This medication education was reviewed with me or my healthcare representative as part of my discharge preparation.  The pharmacist that spoke with me during my hospital stay was:  [place]? Xarelto was prescribed to treat blood clots that may have been found in the veins of your legs (deep vein thrombosis) or in your lungs (pulmonary embolism) and to reduce the risk of them occurring again.  What do you need to know about Xarelto? The starting dose is one 15 mg tablet taken TWICE daily with food for the FIRST 21 DAYS then on (enter date)  07/11/16  the dose is changed to one 20 mg tablet taken ONCE A DAY with your evening meal.  DO NOT stop taking Xarelto without talking to the health care provider who prescribed the medication.  Refill your prescription for 20 mg tablets before you run out.  After discharge, you should have regular check-up appointments with your healthcare provider that is prescribing your Xarelto.  In the future your dose may need to be changed if your kidney function changes by a significant amount.  What do you do if you miss a dose? If you are taking Xarelto TWICE DAILY and you miss a dose, take it as soon as you remember. You may take two 15 mg tablets (total 30 mg) at the same time then resume your regularly scheduled 15 mg twice daily the next day.  If you are taking Xarelto ONCE DAILY and you miss a dose, take it as soon as you remember on the same day then continue your regularly scheduled once daily regimen the next day. Do not take two doses of Xarelto at the same time.   Important Safety Information Xarelto is a blood thinner medicine that can cause bleeding. You should call your healthcare provider right away if you experience any of the following: ? Bleeding from an injury or your nose that does not stop. ? Unusual colored urine (red or dark brown) or unusual  colored stools (red or black). ? Unusual bruising for unknown reasons. ? A serious fall or if you hit your head (even if there is no bleeding).  Some medicines may interact with Xarelto and might increase your risk of bleeding while on Xarelto. To help avoid this, consult your healthcare provider or pharmacist prior to using any new prescription or non-prescription medications, including herbals, vitamins, non-steroidal anti-inflammatory drugs (NSAIDs) and supplements.  This website has more information on Xarelto: [email or web address]

## 2016-06-22 NOTE — Progress Notes (Signed)
PROGRESS NOTE    [name]  [id number] DOB: [birth year] DOA: 06/22/2016 PCP: [name], MD  Outpatient Specialists:   Brief Narrative: 82 y.o. female with medical history significant of history of A. fib (not on anticoagulations), remote history of left-sided breast cancer, hypertension, GERD and recent lower GI bleed (discharge from the hospital about a month ago for GI bleed).  Patient sent from her SNF because of SOB. She recalled transient SOB over the past few days but this morning she had very severe shortness of breath so she was brought to the hospital for further evaluation she was tachycardic as well. Upon initial evaluation in the ED she was found to have A. fib with RVR, initial blood pressure was 86/69 so she was given 500 mL of normal saline, patient currently very hypoxic and she is on a nonrebreather mask satting in the low 90s. CT scan showed extensive PE bilaterally. Doppler ultrasound revealed extensive DVT of right lower extremity. Critical input is appreciated. Also discussed with Hematology, Dr. Earlie Server. Patient is now on Xarelto.  Assessment & Plan:   Principal Problem:   Pulmonary emboli (HCC)   Extensive DVT Right lower extremity  Active Problems:   GI bleed, recent (about a month prior to presentation)   Diverticulosis of colon with hemorrhage   Acute respiratory failure with hypoxia (HCC)   Atrial fibrillation with RVR (HCC)   Pressure ulcer  Pulmonary embolism -Bilateral extensive pulmonary embolism (at least submassive) - On Xarelto (As per the Critical team). Still needing a lot of supplemental oxygen. No bleeding reported. Critical care team input is appreciated.  -Palliative care consulted. - Guarded prognosis.  Atrial fibrillation with RVR - Heart rate is now controlled. -Has known paroxysmal atrial fibrillation, came in with RVR likely secondary to hypoxia/PE. - Still onn cardizem drip, and PRN IV Lopressor.  Acute  respiratory failure with hypoxia -Currently on supplemental oxygen 8L/Min via nasal canula. . -Continue oxygen supplementation. -Patient is DNR/DNI.  Recent GI bleed with diverticulosis of the colon -Patient was recently in the hospital discharge on 05/21/2016 for lower GI bleed which presumed to be diverticular. -Patient will be on anticoagulation will follow closely for any evidence of bleeding. Patient is high risk. Discussed with Critical Team and Hematologist.  History of left-sided breast cancer -Status post mastectomy in 1979, no history of recurrence that she knows of.  DVT prophylaxis: Therapeutic heparin Code Status: DNR Family Communication:   Disposition Plan: Back to skilled nursing facility Consults called: Hematology. Critical care is already following the patient Admission status: SDU, inpatient  Subjective: Nil new complaints. No chest pain. No fever or chills. Seen alongside patient's Nurse. Good appetite. Eating breakfast.  Objective: Vitals:   06/22/16 0600 06/22/16 0606 06/22/16 0800 06/22/16 0906  BP: (!) 86/72 122/86    Pulse: 96 (!) 48    Resp: 16 20    Temp:   97.5 F (36.4 C)   TempSrc:   Axillary   SpO2: 90% 94%  96%  Weight:      Height:        Intake/Output Summary (Last 24 hours) at 06/22/16 0919 Last data filed at 06/22/16 0800  Gross per 24 hour  Intake              680 ml  Output              445 ml  Net              235 ml  Filed Weights   06/25/2016 0852 06/21/2016 1335  Weight: 55.8 kg (123 lb) 57.5 kg (126 lb 12.2 oz)    Examination:  General exam: Appears calm and comfortable. On supplemental oxygen. Respiratory system: Clear to auscultation.   Cardiovascular system: S1 & S2 heard, . Gastrointestinal system: Abdomen is nondistended, soft and nontender.  Central nervous system: Alert and oriented. Moves all limbs. Extremities: Minimal swelling RLE. Data Reviewed: I have personally reviewed following labs and imaging  studies  CBC:  Recent Labs Lab 06/21/2016 0924 06/21/2016 0930 06/20/16 0629 06/21/16 0335 06/22/16 0332  WBC 8.8  --  8.6 8.1 9.4  NEUTROABS 7.1  --   --   --   --   HGB 10.5* 11.6* 10.8* 10.8* 10.3*  HCT 33.0* 34.0* 34.4* 34.4* 32.9*  MCV 87.3  --  88.2 88.9 89.2  PLT 281  --  263 252 0000000   Basic Metabolic Panel:  Recent Labs Lab 05/29/2016 0930 06/20/16 0629 06/21/16 0335 06/22/16 0332  NA 143 141 141 141  K 3.0* 3.9 4.1 3.4*  CL 106 114* 111 110  CO2  --  16* 21* 22  GLUCOSE 146* 166* 144* 147*  BUN 15 16 22* 22*  CREATININE 0.80 0.68 0.69 0.67  CALCIUM  --  9.1 9.3 9.2   GFR: Estimated Creatinine Clearance: 37.1 mL/min (by C-G formula based on SCr of 0.8 mg/dL). Liver Function Tests: No results for input(s): AST, ALT, ALKPHOS, BILITOT, PROT, ALBUMIN in the last 168 hours. No results for input(s): LIPASE, AMYLASE in the last 168 hours. No results for input(s): AMMONIA in the last 168 hours. Coagulation Profile:  Recent Labs Lab 06/23/2016 0924 05/30/2016 1503  INR 1.17 1.20   Cardiac Enzymes:  Recent Labs Lab 06/10/2016 1333 06/05/2016 1855 06/20/16 0112  TROPONINI 0.06* 0.06* 0.06*   BNP (last 3 results) No results for input(s): PROBNP in the last 8760 hours. HbA1C: No results for input(s): HGBA1C in the last 72 hours. CBG: No results for input(s): GLUCAP in the last 168 hours. Lipid Profile: No results for input(s): CHOL, HDL, LDLCALC, TRIG, CHOLHDL, LDLDIRECT in the last 72 hours. Thyroid Function Tests: No results for input(s): TSH, T4TOTAL, FREET4, T3FREE, THYROIDAB in the last 72 hours. Anemia Panel: No results for input(s): VITAMINB12, FOLATE, FERRITIN, TIBC, IRON, RETICCTPCT in the last 72 hours. Urine analysis:    Component Value Date/Time   COLORURINE YELLOW 12/20/2015 1103   APPEARANCEUR CLEAR 12/20/2015 1103   LABSPEC 1.007 12/20/2015 1103   PHURINE 6.0 12/20/2015 1103   GLUCOSEU NEGATIVE 12/20/2015 1103   HGBUR NEGATIVE 12/20/2015 1103    BILIRUBINUR NEGATIVE 12/20/2015 1103   KETONESUR NEGATIVE 12/20/2015 1103   PROTEINUR NEGATIVE 12/20/2015 1103   UROBILINOGEN 0.2 12/27/2014 1020   NITRITE NEGATIVE 12/20/2015 1103   LEUKOCYTESUR NEGATIVE 12/20/2015 1103   Sepsis Labs: @LABRCNTIP (procalcitonin:4,lacticidven:4)  ) Recent Results (from the past 240 hour(s))  MRSA PCR Screening     Status: None   Collection Time: 06/08/2016  5:00 PM  Result Value Ref Range Status   MRSA by PCR NEGATIVE NEGATIVE Final    Comment:        The GeneXpert MRSA Assay (FDA approved for NASAL specimens only), is one component of a comprehensive MRSA colonization surveillance program. It is not intended to diagnose MRSA infection nor to guide or monitor treatment for MRSA infections.          Radiology Studies: Dg Chest Port 1 View  Result Date: 06/20/2016 CLINICAL DATA:  82 year old  female with acute pulmonary embolus, shortness of breath and acute respiratory failure. EXAM: PORTABLE CHEST 1 VIEW COMPARISON:  06/10/2016 chest CT and chest radiograph. FINDINGS: Cardiomegaly and prominent central pulmonary arteries again noted. Left lower lobe atelectasis and left pleural effusion again noted. There is no evidence of pneumothorax or pulmonary edema. There has been little interval change since the prior study. Surgical clips overlying the left chest/axilla noted. IMPRESSION: Unchanged appearance of the chest with left lower lung atelectasis, left pleural effusion, cardiomegaly and prominent central pulmonary arteries. Electronically Signed   By: Margarette Canada M.D.   On: 06/20/2016 10:45       Scheduled Meds: . antiseptic oral rinse  7 mL Mouth Rinse BID  . fluticasone furoate-vilanterol  1 puff Inhalation Daily  . OLANZapine zydis  5 mg Oral QHS  . pantoprazole  40 mg Oral Daily  . Rivaroxaban  15 mg Oral BID WC  . [START ON 07/11/2016] rivaroxaban  20 mg Oral Q supper  . sodium chloride flush  3 mL Intravenous Q12H   Continuous  Infusions: . sodium chloride 10 mL/hr at 06/22/16 0800  . diltiazem (CARDIZEM) infusion 12.5 mg/hr (06/22/16 0800)     LOS: 3 days    Time spent: 16 Minutes    Dana Allan, MD  [facility] Pager #: [phone] 7PM-7AM contact night coverage as above

## 2016-06-22 NOTE — Progress Notes (Deleted)
PROGRESS NOTE    [name]  [id number] DOB: 02/08/1922 DOA: 06/18/2016 PCP: [name], MD  Outpatient Specialists:   Brief Narrative: 82 y.o. female with medical history significant of history of A. fib (not on anticoagulations), remote history of left-sided breast cancer, hypertension, GERD and recent lower GI bleed (discharge from the hospital about a month ago for GI bleed).  Patient sent from her SNF because of SOB. She recalled transient SOB over the past few days but this morning she had very severe shortness of breath so she was brought to the hospital for further evaluation she was tachycardic as well. Upon initial evaluation in the ED she was found to have A. fib with RVR, initial blood pressure was 86/69 so she was given 500 mL of normal saline, patient currently very hypoxic and she is on a nonrebreather mask satting in the low 90s. CT scan showed extensive PE bilaterally. Doppler ultrasound revealed extensive DVT of right lower extremity. Critical input is appreciated. Also discussed with Hematology, Dr. Earlie Server and current management was recommended (Heparin for now).  Assessment & Plan:   Principal Problem:   Pulmonary emboli (HCC)   Extensive DVT Right lower extremity  Active Problems:   GI bleed, recent (about a month prior to presentation)   Diverticulosis of colon with hemorrhage   Acute respiratory failure with hypoxia (HCC)   Atrial fibrillation with RVR (HCC)   Pressure ulcer  Pulmonary embolism -Bilateral extensive pulmonary embolism (at least submassive) - Now on Xarelto (As per the Critical team). Critical care team input is appreciated.  -Palliative care consulted. -Patient remains significantly hypoxic on 8L of supplemental oxygen via nasal canula - Guarded prognosis.  Atrial fibrillation with RVR - Heart rate is now controlled. -Has known paroxysmal atrial fibrillation, came in with RVR likely secondary to hypoxia/PE. - On cardizem  drip, and PRN IV Lopressor.  Acute respiratory failure with hypoxia -Currently on supplemental oxygen 8L/Min via nasal canula. . -Continue oxygen supplementation. -Patient is DNR/DNI.  Recent GI bleed with diverticulosis of the colon -Patient was recently in the hospital discharge on 05/21/2016 for lower GI bleed which presumed to be diverticular. -Patient will be on anticoagulation will follow closely for any evidence of bleeding. Patient is high risk. Discussed with Critical Team and Hematologist.  History of left-sided breast cancer -Status post mastectomy in 1979, no history of recurrence that she knows of.  DVT prophylaxis: Therapeutic heparin Code Status: DNR Family Communication:   Disposition Plan: Back to skilled nursing facility Consults called: Hematology. Critical care is already following the patient Admission status: SDU, inpatient  Subjective: Nil new complaints. Seen alongside patient's Nurse. No fever or chills. No chest pain. Comfortable.  Objective: Vitals:   06/22/16 0400 06/22/16 0500 06/22/16 0600 06/22/16 0606  BP: 124/79 133/79 (!) 86/72 122/86  Pulse: 89 (!) 110 96 (!) 48  Resp: 15 (!) 23 16 20   Temp: 97 F (36.1 C)     TempSrc: Axillary     SpO2: 97% (!) 89% 90% 94%  Weight:      Height:        Intake/Output Summary (Last 24 hours) at 06/22/16 0740 Last data filed at 06/22/16 0600  Gross per 24 hour  Intake              700 ml  Output              445 ml  Net  255 ml   Filed Weights   06/01/2016 0852 06/25/2016 1335  Weight: 55.8 kg (123 lb) 57.5 kg (126 lb 12.2 oz)    Examination:  General exam: Appears calm and comfortable. On supplemental oxygen. Respiratory system: Clear to auscultation.   Cardiovascular system: S1 & S2 heard, . Gastrointestinal system: Abdomen is nondistended, soft and nontender.  Central nervous system: Alert and oriented. Moves all limbs. Extremities: Minimal swelling RLE. Data Reviewed: I have  personally reviewed following labs and imaging studies  CBC:  Recent Labs Lab 06/24/2016 0924 06/02/2016 0930 06/20/16 0629 06/21/16 0335 06/22/16 0332  WBC 8.8  --  8.6 8.1 9.4  NEUTROABS 7.1  --   --   --   --   HGB 10.5* 11.6* 10.8* 10.8* 10.3*  HCT 33.0* 34.0* 34.4* 34.4* 32.9*  MCV 87.3  --  88.2 88.9 89.2  PLT 281  --  263 252 0000000   Basic Metabolic Panel:  Recent Labs Lab 06/06/2016 0930 06/20/16 0629 06/21/16 0335 06/22/16 0332  NA 143 141 141 141  K 3.0* 3.9 4.1 3.4*  CL 106 114* 111 110  CO2  --  16* 21* 22  GLUCOSE 146* 166* 144* 147*  BUN 15 16 22* 22*  CREATININE 0.80 0.68 0.69 0.67  CALCIUM  --  9.1 9.3 9.2   GFR: Estimated Creatinine Clearance: 37.1 mL/min (by C-G formula based on SCr of 0.8 mg/dL). Liver Function Tests: No results for input(s): AST, ALT, ALKPHOS, BILITOT, PROT, ALBUMIN in the last 168 hours. No results for input(s): LIPASE, AMYLASE in the last 168 hours. No results for input(s): AMMONIA in the last 168 hours. Coagulation Profile:  Recent Labs Lab 06/01/2016 0924 06/11/2016 1503  INR 1.17 1.20   Cardiac Enzymes:  Recent Labs Lab 06/02/2016 1333 06/12/2016 1855 06/20/16 0112  TROPONINI 0.06* 0.06* 0.06*   BNP (last 3 results) No results for input(s): PROBNP in the last 8760 hours. HbA1C: No results for input(s): HGBA1C in the last 72 hours. CBG: No results for input(s): GLUCAP in the last 168 hours. Lipid Profile: No results for input(s): CHOL, HDL, LDLCALC, TRIG, CHOLHDL, LDLDIRECT in the last 72 hours. Thyroid Function Tests: No results for input(s): TSH, T4TOTAL, FREET4, T3FREE, THYROIDAB in the last 72 hours. Anemia Panel: No results for input(s): VITAMINB12, FOLATE, FERRITIN, TIBC, IRON, RETICCTPCT in the last 72 hours. Urine analysis:    Component Value Date/Time   COLORURINE YELLOW 12/20/2015 1103   APPEARANCEUR CLEAR 12/20/2015 1103   LABSPEC 1.007 12/20/2015 1103   PHURINE 6.0 12/20/2015 1103   GLUCOSEU NEGATIVE  12/20/2015 1103   HGBUR NEGATIVE 12/20/2015 1103   BILIRUBINUR NEGATIVE 12/20/2015 1103   KETONESUR NEGATIVE 12/20/2015 1103   PROTEINUR NEGATIVE 12/20/2015 1103   UROBILINOGEN 0.2 12/27/2014 1020   NITRITE NEGATIVE 12/20/2015 1103   LEUKOCYTESUR NEGATIVE 12/20/2015 1103   Sepsis Labs: @LABRCNTIP (procalcitonin:4,lacticidven:4)  ) Recent Results (from the past 240 hour(s))  MRSA PCR Screening     Status: None   Collection Time: 06/10/2016  5:00 PM  Result Value Ref Range Status   MRSA by PCR NEGATIVE NEGATIVE Final    Comment:        The GeneXpert MRSA Assay (FDA approved for NASAL specimens only), is one component of a comprehensive MRSA colonization surveillance program. It is not intended to diagnose MRSA infection nor to guide or monitor treatment for MRSA infections.          Radiology Studies: Dg Chest Port 1 View  Result Date: 06/20/2016  CLINICAL DATA:  82 year old female with acute pulmonary embolus, shortness of breath and acute respiratory failure. EXAM: PORTABLE CHEST 1 VIEW COMPARISON:  06/13/2016 chest CT and chest radiograph. FINDINGS: Cardiomegaly and prominent central pulmonary arteries again noted. Left lower lobe atelectasis and left pleural effusion again noted. There is no evidence of pneumothorax or pulmonary edema. There has been little interval change since the prior study. Surgical clips overlying the left chest/axilla noted. IMPRESSION: Unchanged appearance of the chest with left lower lung atelectasis, left pleural effusion, cardiomegaly and prominent central pulmonary arteries. Electronically Signed   By: Margarette Canada M.D.   On: 06/20/2016 10:45       Scheduled Meds: . antiseptic oral rinse  7 mL Mouth Rinse BID  . fluticasone furoate-vilanterol  1 puff Inhalation Daily  . OLANZapine zydis  5 mg Oral QHS  . pantoprazole  40 mg Oral Daily  . Rivaroxaban  15 mg Oral BID WC  . [START ON 07/11/2016] rivaroxaban  20 mg Oral Q supper  . sodium  chloride flush  3 mL Intravenous Q12H   Continuous Infusions: . sodium chloride 10 mL/hr at 06/22/16 0600  . diltiazem (CARDIZEM) infusion 12.5 mg/hr (06/22/16 0600)     LOS: 3 days    Time spent: 49 Minutes    Dana Allan, MD  [facility] Pager #: [phone] 7PM-7AM contact night coverage as above

## 2016-06-22 NOTE — Progress Notes (Signed)
MD made aware of decreased urine output, orders in place.  Will continue to monitor.

## 2016-06-22 NOTE — Progress Notes (Signed)
Date:  June 22, 2016 Chart reviewed for concurrent status and case management needs. Will continue to follow the patient for changes and needs:  Iv Cardizem drip with a.fib Velva Harman, BSN, [place], [place]

## 2016-06-23 ENCOUNTER — Ambulatory Visit: Payer: Self-pay | Admitting: Internal Medicine

## 2016-06-23 LAB — CBC
HCT: 34.3 % — ABNORMAL LOW (ref 36.0–46.0)
HEMOGLOBIN: 10.8 g/dL — AB (ref 12.0–15.0)
MCH: 27.8 pg (ref 26.0–34.0)
MCHC: 31.5 g/dL (ref 30.0–36.0)
MCV: 88.4 fL (ref 78.0–100.0)
PLATELETS: 296 10*3/uL (ref 150–400)
RBC: 3.88 MIL/uL (ref 3.87–5.11)
RDW: 16.2 % — ABNORMAL HIGH (ref 11.5–15.5)
WBC: 7 10*3/uL (ref 4.0–10.5)

## 2016-06-23 LAB — BASIC METABOLIC PANEL
ANION GAP: 7 (ref 5–15)
BUN: 21 mg/dL — ABNORMAL HIGH (ref 6–20)
CO2: 23 mmol/L (ref 22–32)
CREATININE: 0.49 mg/dL (ref 0.44–1.00)
Calcium: 8.9 mg/dL (ref 8.9–10.3)
Chloride: 111 mmol/L (ref 101–111)
Glucose, Bld: 132 mg/dL — ABNORMAL HIGH (ref 65–99)
Potassium: 3.7 mmol/L (ref 3.5–5.1)
SODIUM: 141 mmol/L (ref 135–145)

## 2016-06-23 MED ORDER — METOPROLOL TARTRATE 25 MG PO TABS
25.0000 mg | ORAL_TABLET | Freq: Four times a day (QID) | ORAL | Status: DC
  Administered 2016-06-23 – 2016-06-24 (×4): 25 mg via ORAL
  Filled 2016-06-23 (×4): qty 1

## 2016-06-23 NOTE — Progress Notes (Signed)
PROGRESS NOTE    MAYDEAN Wang  [id number] DOB: 07-Nov-1922 DOA: 06/17/2016 PCP: [name], MD  Outpatient Specialists:   Brief Narrative: 82 y.o. female with medical history significant of history of A. fib (not on anticoagulations), remote history of left-sided breast cancer, hypertension, GERD and recent lower GI bleed (discharge from the hospital about a month ago for GI bleed).  Patient sent from her SNF because of SOB. She recalled transient SOB over the past few days but this morning she had very severe shortness of breath so she was brought to the hospital for further evaluation she was tachycardic as well. Upon initial evaluation in the ED she was found to have A. fib with RVR, initial blood pressure was 86/69 so she was given 500 mL of normal saline, patient currently very hypoxic and she is on a nonrebreather mask satting in the low 90s. CT scan showed extensive PE bilaterally. Doppler ultrasound revealed extensive DVT of right lower extremity. Critical input is appreciated. Also discussed with Hematology, Dr. Earlie Server. Patient is now on Xarelto.  Assessment & Plan:   Principal Problem:   Pulmonary emboli (HCC)   Extensive DVT Right lower extremity  Active Problems:   GI bleed, recent (about a month prior to presentation)   Diverticulosis of colon with hemorrhage   Acute respiratory failure with hypoxia (HCC)   Atrial fibrillation with RVR (HCC)   Pressure ulcer  Pulmonary embolism -Bilateral extensive pulmonary embolism (at least submassive) - Continue Xarelto (As per the Critical team). Continue to titrate supplemental oxygen downwards as tolerated. Currently on 6L/min of supplemental oxygen. No bleeding reported. May consider LTAC if cardizem weaned off and patient is still requiring a significant amount of supplemental oxygen. -Palliative care input is appreciated. - Guarded prognosis.  Atrial fibrillation with RVR - Still on cardizem drip. Introduce  oral Metoprolol 25mg  po Q6H. Gradually wean patient off Cardizem drip.  Acute respiratory failure with hypoxia -Currently on supplemental oxygen 6L/Min. -Continue oxygen supplementation. -Patient is DNR/DNI.  Recent GI bleed with diverticulosis of the colon -Patient was recently in the hospital discharge on 05/21/2016 for lower GI bleed which presumed to be diverticular. -Patient will be on anticoagulation will follow closely for any evidence of bleeding. Patient is high risk. Discussed with Critical Team and Hematologist.  History of left-sided breast cancer -Status post mastectomy in 1979, no history of recurrence that she knows of.  DVT prophylaxis: Therapeutic heparin Code Status: DNR Family Communication:   Disposition Plan: Back to skilled nursing facility Consults called: Hematology. Critical care is already following the patient Admission status: SDU, inpatient  Subjective: Nil new complaints. No chest pain. No fever or chills.   Objective: Vitals:   06/23/16 0747 06/23/16 0752 06/23/16 0800 06/23/16 0810  BP:   (!) 216/107 (!) 170/95  Pulse:   95   Resp:   (!) 24 (!) 22  Temp: 97.6 F (36.4 C)     TempSrc: Oral     SpO2:  96% 96%   Weight:      Height:        Intake/Output Summary (Last 24 hours) at 06/23/16 0926 Last data filed at 06/23/16 0600  Gross per 24 hour  Intake              220 ml  Output              810 ml  Net             -590 ml  Filed Weights   06/08/2016 0852 05/30/2016 1335  Weight: 55.8 kg (123 lb) 57.5 kg (126 lb 12.2 oz)    Examination:  General exam: Appears calm and comfortable. On supplemental oxygen. Respiratory system: Clear to auscultation.   Cardiovascular system: S1 & S2, tachycardia. Gastrointestinal system: Abdomen is nondistended, soft and nontender.  Central nervous system: Alert and oriented. Moves all limbs. Extremities: Minimal swelling RLE. Data Reviewed: I have personally reviewed following labs and imaging  studies  CBC:  Recent Labs Lab 05/31/2016 0924 06/12/2016 0930 06/20/16 0629 06/21/16 0335 06/22/16 0332 06/23/16 0329  WBC 8.8  --  8.6 8.1 9.4 7.0  NEUTROABS 7.1  --   --   --   --   --   HGB 10.5* 11.6* 10.8* 10.8* 10.3* 10.8*  HCT 33.0* 34.0* 34.4* 34.4* 32.9* 34.3*  MCV 87.3  --  88.2 88.9 89.2 88.4  PLT 281  --  263 252 296 0000000   Basic Metabolic Panel:  Recent Labs Lab 06/20/16 0629 06/21/16 0335 06/22/16 0332 06/22/16 1757 06/23/16 0329  NA 141 141 141 141 141  K 3.9 4.1 3.4* 3.5 3.7  CL 114* 111 110 109 111  CO2 16* 21* 22 25 23   GLUCOSE 166* 144* 147* 131* 132*  BUN 16 22* 22* 21* 21*  CREATININE 0.68 0.69 0.67 0.53 0.49  CALCIUM 9.1 9.3 9.2 9.2 8.9   GFR: Estimated Creatinine Clearance: 37.1 mL/min (by C-G formula based on SCr of 0.8 mg/dL). Liver Function Tests: No results for input(s): AST, ALT, ALKPHOS, BILITOT, PROT, ALBUMIN in the last 168 hours. No results for input(s): LIPASE, AMYLASE in the last 168 hours. No results for input(s): AMMONIA in the last 168 hours. Coagulation Profile:  Recent Labs Lab 06/08/2016 0924 05/27/2016 1503  INR 1.17 1.20   Cardiac Enzymes:  Recent Labs Lab 06/23/2016 1333 05/31/2016 1855 06/20/16 0112  TROPONINI 0.06* 0.06* 0.06*   BNP (last 3 results) No results for input(s): PROBNP in the last 8760 hours. HbA1C: No results for input(s): HGBA1C in the last 72 hours. CBG: No results for input(s): GLUCAP in the last 168 hours. Lipid Profile: No results for input(s): CHOL, HDL, LDLCALC, TRIG, CHOLHDL, LDLDIRECT in the last 72 hours. Thyroid Function Tests: No results for input(s): TSH, T4TOTAL, FREET4, T3FREE, THYROIDAB in the last 72 hours. Anemia Panel: No results for input(s): VITAMINB12, FOLATE, FERRITIN, TIBC, IRON, RETICCTPCT in the last 72 hours. Urine analysis:    Component Value Date/Time   COLORURINE YELLOW 12/20/2015 1103   APPEARANCEUR CLEAR 12/20/2015 1103   LABSPEC 1.007 12/20/2015 1103   PHURINE  6.0 12/20/2015 1103   GLUCOSEU NEGATIVE 12/20/2015 1103   HGBUR NEGATIVE 12/20/2015 1103   BILIRUBINUR NEGATIVE 12/20/2015 1103   KETONESUR NEGATIVE 12/20/2015 1103   PROTEINUR NEGATIVE 12/20/2015 1103   UROBILINOGEN 0.2 12/27/2014 1020   NITRITE NEGATIVE 12/20/2015 1103   LEUKOCYTESUR NEGATIVE 12/20/2015 1103   Sepsis Labs: @LABRCNTIP (procalcitonin:4,lacticidven:4)  ) Recent Results (from the past 240 hour(s))  MRSA PCR Screening     Status: None   Collection Time: 06/16/2016  5:00 PM  Result Value Ref Range Status   MRSA by PCR NEGATIVE NEGATIVE Final    Comment:        The GeneXpert MRSA Assay (FDA approved for NASAL specimens only), is one component of a comprehensive MRSA colonization surveillance program. It is not intended to diagnose MRSA infection nor to guide or monitor treatment for MRSA infections.  Radiology Studies: No results found.      Scheduled Meds: . antiseptic oral rinse  7 mL Mouth Rinse BID  . fluticasone furoate-vilanterol  1 puff Inhalation Daily  . OLANZapine zydis  5 mg Oral QHS  . pantoprazole  40 mg Oral Daily  . Rivaroxaban  15 mg Oral BID WC  . [START ON 07/11/2016] rivaroxaban  20 mg Oral Q supper  . sodium chloride flush  3 mL Intravenous Q12H   Continuous Infusions: . sodium chloride 10 mL/hr at 06/22/16 1800  . diltiazem (CARDIZEM) infusion 15 mg/hr (06/23/16 0903)     LOS: 4 days    Time spent: 54 Minutes    Dana Allan, MD  [facility] Pager #: [phone] 7PM-7AM contact night coverage as above

## 2016-06-24 DIAGNOSIS — R319 Hematuria, unspecified: Secondary | ICD-10-CM

## 2016-06-24 LAB — URINE MICROSCOPIC-ADD ON

## 2016-06-24 LAB — BASIC METABOLIC PANEL
ANION GAP: 8 (ref 5–15)
BUN: 23 mg/dL — ABNORMAL HIGH (ref 6–20)
CALCIUM: 8.8 mg/dL — AB (ref 8.9–10.3)
CO2: 23 mmol/L (ref 22–32)
CREATININE: 0.76 mg/dL (ref 0.44–1.00)
Chloride: 109 mmol/L (ref 101–111)
Glucose, Bld: 151 mg/dL — ABNORMAL HIGH (ref 65–99)
Potassium: 4 mmol/L (ref 3.5–5.1)
Sodium: 140 mmol/L (ref 135–145)

## 2016-06-24 LAB — URINALYSIS, ROUTINE W REFLEX MICROSCOPIC
Bilirubin Urine: NEGATIVE
Glucose, UA: NEGATIVE mg/dL
Ketones, ur: NEGATIVE mg/dL
Nitrite: NEGATIVE
Protein, ur: >300 mg/dL — AB
Specific Gravity, Urine: 1.025 (ref 1.005–1.030)
pH: 6 (ref 5.0–8.0)

## 2016-06-24 LAB — CBC
HCT: 36.1 % (ref 36.0–46.0)
HEMOGLOBIN: 11.1 g/dL — AB (ref 12.0–15.0)
MCH: 27.1 pg (ref 26.0–34.0)
MCHC: 30.7 g/dL (ref 30.0–36.0)
MCV: 88 fL (ref 78.0–100.0)
PLATELETS: 332 10*3/uL (ref 150–400)
RBC: 4.1 MIL/uL (ref 3.87–5.11)
RDW: 16 % — ABNORMAL HIGH (ref 11.5–15.5)
WBC: 7.7 10*3/uL (ref 4.0–10.5)

## 2016-06-24 MED ORDER — DILTIAZEM HCL 100 MG IV SOLR
5.0000 mg/h | INTRAVENOUS | Status: DC
  Administered 2016-06-25: 10 mg/h via INTRAVENOUS
  Administered 2016-06-25: 7.5 mg/h via INTRAVENOUS
  Filled 2016-06-24 (×4): qty 100

## 2016-06-24 MED ORDER — METOPROLOL TARTRATE 25 MG PO TABS
37.5000 mg | ORAL_TABLET | Freq: Four times a day (QID) | ORAL | Status: DC
  Administered 2016-06-24: 37.5 mg via ORAL
  Filled 2016-06-24 (×2): qty 1

## 2016-06-24 MED ORDER — DILTIAZEM HCL 30 MG PO TABS
30.0000 mg | ORAL_TABLET | Freq: Three times a day (TID) | ORAL | Status: DC
  Administered 2016-06-24: 30 mg via ORAL
  Filled 2016-06-24: qty 1

## 2016-06-24 MED ORDER — LORAZEPAM 2 MG/ML IJ SOLN
0.2500 mg | Freq: Three times a day (TID) | INTRAMUSCULAR | Status: DC | PRN
  Administered 2016-06-24: 0.25 mg via INTRAVENOUS
  Filled 2016-06-24: qty 1

## 2016-06-24 NOTE — Progress Notes (Signed)
Patient has become increasingly confused and agitated.  She has had multiple attempts at trying to climb out of bed, however has been talked down and reoriented by multiple staff.  MD is aware of increased agitation.  Will continue to monitor.

## 2016-06-24 NOTE — Progress Notes (Signed)
Attempted to discontinue IV Cardizem and administer PO Cardizem per MD order.  Pt's HR has consistently been in the 110s-130s at rest, and 140s when agitated.  MD made aware of the increase in HR and BP, and per MD order, IV Cardizem has been restarted.    Will continue to monitor.

## 2016-06-24 NOTE — Progress Notes (Addendum)
PROGRESS NOTE    [name]  [id number] DOB: 27-Sep-1922 DOA: 06/25/2016 PCP: [name], MD  Outpatient Specialists:   Brief Narrative: 82 y.o. female with medical history significant of history of A. fib (not on anticoagulations), remote history of left-sided breast cancer, hypertension, GERD and recent lower GI bleed (discharge from the hospital about a month ago for GI bleed).  Patient sent from her SNF because of SOB. She recalled transient SOB over the past few days but this morning she had very severe shortness of breath so she was brought to the hospital for further evaluation she was tachycardic as well. Upon initial evaluation in the ED she was found to have A. fib with RVR, initial blood pressure was 86/69 so she was given 500 mL of normal saline, patient currently very hypoxic and she is on a nonrebreather mask satting in the low 90s. CT scan showed extensive PE bilaterally. Doppler ultrasound revealed extensive DVT of right lower extremity. Critical input is appreciated. Also discussed with Hematology, Dr. Earlie Server. Patient is now on Xarelto.  Assessment & Plan:   Principal Problem:   Pulmonary emboli (HCC)   Extensive DVT Right lower extremity  Hematuria  Active Problems:   GI bleed, recent (about a month prior to presentation)   Diverticulosis of colon with hemorrhage   Acute respiratory failure with hypoxia (HCC)   Atrial fibrillation with RVR (HCC)   Pressure ulcer  Pulmonary embolism -Bilateral extensive pulmonary embolism (at least submassive) - Xarelto on hold this morning due to hematuria. No GI bleed. Hopefully, hematuria will clear and Foley will be discontinued. Bladder scan. If bleeding persists, may have to DC Xarelto and plan for IVC Filter. Continue to titrate supplemental oxygen downwards as tolerated. Currently on 6L/min of supplemental oxygen. May consider LTAC versus short term SNF when cardizem is weaned of (Disposition may depend on  oxygen requirement). -Palliative care input is appreciated. - Guarded prognosis.  Atrial fibrillation with RVR - Still on cardizem drip, but done to 5Mcg/min. Will increase Metoprolol to 337.5mg  po Q6H, and aim to wean patient off IV cardizem.   Acute respiratory failure with hypoxia -Currently on supplemental oxygen 6L/Min. -Continue oxygen supplementation. -Patient is DNR/DNI.  Recent GI bleed with diverticulosis of the colon -Patient was recently in the hospital discharge on 05/21/2016 for lower GI bleed which presumed to be diverticular. -Patient will be on anticoagulation will follow closely for any evidence of bleeding. Patient is high risk. Discussed with Critical Team and Hematologist.  History of left-sided breast cancer -Status post mastectomy in 1979, no history of recurrence that she knows of.  DVT prophylaxis: Therapeutic heparin Code Status: DNR Family Communication:   Disposition Plan: Back to skilled nursing facility Consults called: Hematology. Critical care is already following the patient Admission status: SDU, inpatient  Subjective: Noted to be more confused today. No fever or chills. No SOB reported. Hematuria noted (Bloody urine in Foley bag)  Objective: Vitals:   06/24/16 0500 06/24/16 0600 06/24/16 0741 06/24/16 0800  BP:  (!) 51/34  (!) 142/98  Pulse: (!) 33   83  Resp: (!) 21 17  (!) 25  Temp:   97.7 F (36.5 C)   TempSrc:   Oral   SpO2: (!) 78%   (!) 89%  Weight:      Height:        Intake/Output Summary (Last 24 hours) at 06/24/16 0952 Last data filed at 06/24/16 0700  Gross per 24 hour  Intake  580 ml  Output              325 ml  Net              255 ml   Filed Weights   06/25/2016 0852 06/06/2016 1335  Weight: 55.8 kg (123 lb) 57.5 kg (126 lb 12.2 oz)    Examination:  General exam: Appears calm and comfortable. On supplemental oxygen. Respiratory system: Clear to auscultation.   Cardiovascular system: S1 & S2,  tachycardia. Gastrointestinal system: Abdomen is nondistended, soft and nontender.  Central nervous system: Alert and oriented. Moves all limbs. Extremities: Minimal swelling RLE. Data Reviewed: I have personally reviewed following labs and imaging studies  CBC:  Recent Labs Lab 06/06/2016 0924  06/20/16 0629 06/21/16 0335 06/22/16 0332 06/23/16 0329 06/24/16 0316  WBC 8.8  --  8.6 8.1 9.4 7.0 7.7  NEUTROABS 7.1  --   --   --   --   --   --   HGB 10.5*  < > 10.8* 10.8* 10.3* 10.8* 11.1*  HCT 33.0*  < > 34.4* 34.4* 32.9* 34.3* 36.1  MCV 87.3  --  88.2 88.9 89.2 88.4 88.0  PLT 281  --  263 252 296 296 332  < > = values in this interval not displayed. Basic Metabolic Panel:  Recent Labs Lab 06/21/16 0335 06/22/16 0332 06/22/16 1757 06/23/16 0329 06/24/16 0316  NA 141 141 141 141 140  K 4.1 3.4* 3.5 3.7 4.0  CL 111 110 109 111 109  CO2 21* 22 25 23 23   GLUCOSE 144* 147* 131* 132* 151*  BUN 22* 22* 21* 21* 23*  CREATININE 0.69 0.67 0.53 0.49 0.76  CALCIUM 9.3 9.2 9.2 8.9 8.8*   GFR: Estimated Creatinine Clearance: 37.1 mL/min (by C-G formula based on SCr of 0.8 mg/dL). Liver Function Tests: No results for input(s): AST, ALT, ALKPHOS, BILITOT, PROT, ALBUMIN in the last 168 hours. No results for input(s): LIPASE, AMYLASE in the last 168 hours. No results for input(s): AMMONIA in the last 168 hours. Coagulation Profile:  Recent Labs Lab 06/09/2016 0924 06/06/2016 1503  INR 1.17 1.20   Cardiac Enzymes:  Recent Labs Lab 06/04/2016 1333 06/16/2016 1855 06/20/16 0112  TROPONINI 0.06* 0.06* 0.06*   BNP (last 3 results) No results for input(s): PROBNP in the last 8760 hours. HbA1C: No results for input(s): HGBA1C in the last 72 hours. CBG: No results for input(s): GLUCAP in the last 168 hours. Lipid Profile: No results for input(s): CHOL, HDL, LDLCALC, TRIG, CHOLHDL, LDLDIRECT in the last 72 hours. Thyroid Function Tests: No results for input(s): TSH, T4TOTAL, FREET4,  T3FREE, THYROIDAB in the last 72 hours. Anemia Panel: No results for input(s): VITAMINB12, FOLATE, FERRITIN, TIBC, IRON, RETICCTPCT in the last 72 hours. Urine analysis:    Component Value Date/Time   COLORURINE YELLOW 12/20/2015 1103   APPEARANCEUR CLEAR 12/20/2015 1103   LABSPEC 1.007 12/20/2015 1103   PHURINE 6.0 12/20/2015 1103   GLUCOSEU NEGATIVE 12/20/2015 1103   HGBUR NEGATIVE 12/20/2015 1103   BILIRUBINUR NEGATIVE 12/20/2015 1103   KETONESUR NEGATIVE 12/20/2015 1103   PROTEINUR NEGATIVE 12/20/2015 1103   UROBILINOGEN 0.2 12/27/2014 1020   NITRITE NEGATIVE 12/20/2015 1103   LEUKOCYTESUR NEGATIVE 12/20/2015 1103   Sepsis Labs: @LABRCNTIP (procalcitonin:4,lacticidven:4)  ) Recent Results (from the past 240 hour(s))  MRSA PCR Screening     Status: None   Collection Time: 05/30/2016  5:00 PM  Result Value Ref Range Status   MRSA by PCR NEGATIVE  NEGATIVE Final    Comment:        The GeneXpert MRSA Assay (FDA approved for NASAL specimens only), is one component of a comprehensive MRSA colonization surveillance program. It is not intended to diagnose MRSA infection nor to guide or monitor treatment for MRSA infections.          Radiology Studies: No results found.      Scheduled Meds: . antiseptic oral rinse  7 mL Mouth Rinse BID  . fluticasone furoate-vilanterol  1 puff Inhalation Daily  . metoprolol tartrate  37.5 mg Oral Q6H  . OLANZapine zydis  5 mg Oral QHS  . pantoprazole  40 mg Oral Daily  . Rivaroxaban  15 mg Oral BID WC  . [START ON 07/11/2016] rivaroxaban  20 mg Oral Q supper  . sodium chloride flush  3 mL Intravenous Q12H   Continuous Infusions: . sodium chloride 10 mL/hr at 06/24/16 0700  . diltiazem (CARDIZEM) infusion 7.5 mg/hr (06/24/16 0700)     LOS: 5 days    Time spent: 66 Minutes    Dana Allan, MD  [facility] Pager #: [phone] 7PM-7AM contact night coverage as above   Pressure ulcers (legs) - Present on  admission.

## 2016-06-24 NOTE — Progress Notes (Signed)
Pt seemed sleepy during my brief visit; Nurse encouraged to page if she awakens and needs to assistance.  [place]   06/24/16 1500  Clinical Encounter Type  Visited With Patient;Health care provider

## 2016-06-25 LAB — BASIC METABOLIC PANEL
ANION GAP: 7 (ref 5–15)
Anion gap: 9 (ref 5–15)
BUN: 23 mg/dL — ABNORMAL HIGH (ref 6–20)
BUN: 28 mg/dL — ABNORMAL HIGH (ref 6–20)
CALCIUM: 8.5 mg/dL — AB (ref 8.9–10.3)
CHLORIDE: 111 mmol/L (ref 101–111)
CO2: 23 mmol/L (ref 22–32)
CO2: 23 mmol/L (ref 22–32)
Calcium: 9.1 mg/dL (ref 8.9–10.3)
Chloride: 109 mmol/L (ref 101–111)
Creatinine, Ser: 0.54 mg/dL (ref 0.44–1.00)
Creatinine, Ser: 0.81 mg/dL (ref 0.44–1.00)
GFR calc Af Amer: >60 mL/min (ref >60)
GFR calc non Af Amer: >60 mL/min (ref >60)
GFR calc non Af Amer: >60 mL/min (ref >60)
GLUCOSE: 136 mg/dL — AB (ref 65–99)
Glucose, Bld: 153 mg/dL — ABNORMAL HIGH (ref 65–99)
POTASSIUM: 3.5 mmol/L (ref 3.5–5.1)
Potassium: 4.7 mmol/L (ref 3.5–5.1)
Sodium: 141 mmol/L (ref 135–145)
Sodium: 141 mmol/L (ref 135–145)

## 2016-06-25 LAB — CBC
HEMATOCRIT: 30.9 % — AB (ref 36.0–46.0)
HEMOGLOBIN: 9.6 g/dL — AB (ref 12.0–15.0)
MCH: 27.6 pg (ref 26.0–34.0)
MCHC: 31.1 g/dL (ref 30.0–36.0)
MCV: 88.8 fL (ref 78.0–100.0)
Platelets: 265 10*3/uL (ref 150–400)
RBC: 3.48 MIL/uL — AB (ref 3.87–5.11)
RDW: 15.9 % — ABNORMAL HIGH (ref 11.5–15.5)
WBC: 6.3 10*3/uL (ref 4.0–10.5)

## 2016-06-25 LAB — URINE CULTURE: Culture: NO GROWTH

## 2016-06-25 MED ORDER — LORAZEPAM 2 MG/ML IJ SOLN
0.5000 mg | Freq: Once | INTRAMUSCULAR | Status: AC
  Administered 2016-06-25: 0.5 mg via INTRAVENOUS
  Filled 2016-06-25: qty 1

## 2016-06-25 MED ORDER — MORPHINE SULFATE (PF) 2 MG/ML IV SOLN
2.0000 mg | Freq: Once | INTRAVENOUS | Status: AC
  Administered 2016-06-25: 2 mg via INTRAVENOUS
  Filled 2016-06-25: qty 1

## 2016-06-25 MED ORDER — SODIUM CHLORIDE 0.9 % IV SOLN
INTRAVENOUS | Status: DC
  Administered 2016-06-25: 20:00:00 via INTRAVENOUS

## 2016-06-25 NOTE — Progress Notes (Signed)
U/O over last seven hours is 45 mL.  Bladder scan showed 16 mL.  Notified Dr Marthenia Rolling.  Will continue to monitor. Katherine Mantle RN

## 2016-06-25 NOTE — Progress Notes (Signed)
PROGRESS NOTE    YANAI RUTKOSKI  [id number] DOB: 21-Aug-1922 DOA: 05/27/2016 PCP: [name], MD  Outpatient Specialists:   Brief Narrative: 82 y.o. female with medical history significant of history of A. fib (not on anticoagulations), remote history of left-sided breast cancer, hypertension, GERD and recent lower GI bleed (discharge from the hospital about a month ago for GI bleed).  Patient sent from her SNF because of SOB. She recalled transient SOB over the past few days but this morning she had very severe shortness of breath so she was brought to the hospital for further evaluation she was tachycardic as well. Upon initial evaluation in the ED she was found to have A. fib with RVR, initial blood pressure was 86/69 so she was given 500 mL of normal saline, patient currently very hypoxic and she is on a nonrebreather mask satting in the low 90s. CT scan showed extensive PE bilaterally. Doppler ultrasound revealed extensive DVT of right lower extremity. Critical input is appreciated. Also discussed with Hematology, Dr. Earlie Server. Patient is now on Xarelto.  Assessment & Plan:   Principal Problem:   Pulmonary emboli (HCC)   Extensive DVT Right lower extremity  Hematuria  Active Problems:   GI bleed, recent (about a month prior to presentation)   Diverticulosis of colon with hemorrhage   Acute respiratory failure with hypoxia (HCC)   Atrial fibrillation with RVR (HCC)   Pressure ulcer  Pulmonary embolism -Bilateral extensive pulmonary embolism (at least submassive) - Xarelto is still on hold. Blood in urine is clearing. Discussed possible options with the patient's Grand daughter, [name] (patient's power of Attorney on [phone]). It was communicated to the POA that if the blood in urine does not resolve or if any other bleeding is noted that the only other option will be to proceed with IVC filter. Possible complications and limitations of IVC Filter were discussed  with the POA. Continue to titrate supplemental oxygen downwards as tolerated. Currently on 4L/min of supplemental oxygen while awake. May consider LTAC versus short term SNF when cardizem is weaned of (Disposition may depend on oxygen requirement). -Palliative care input is appreciated. - Guarded prognosis.  Atrial fibrillation with RVR - Still on cardizem drip, especially as patient has not been taken PO since morning. Metoprolol 37.5mg  po Q6H prescribed but currently on hold.   Acute respiratory failure with hypoxia -Currently on supplemental oxygen 4L/Min. -Continue oxygen supplementation. -Patient is DNR/DNI.  Recent GI bleed with diverticulosis of the colon -Patient was recently in the hospital discharge on 05/21/2016 for lower GI bleed which presumed to be diverticular. -Patient will be on anticoagulation will follow closely for any evidence of bleeding. Patient is high risk. Discussed with Critical Team and Hematologist.  History of left-sided breast cancer -Status post mastectomy in 1979, no history of recurrence that she knows of.  DVT prophylaxis: Xarelto is on hold Code Status: DNR Family Communication:   Disposition Plan: Back to skilled nursing facility Consults called: Hematology and Critical care Medicine. Admission status: Inpatient  Subjective: Sleeping quietly. Seen alongside patient's Nurse. Blood in urine is slowly clearing.   Objective: Vitals:   06/25/16 0300 06/25/16 0400 06/25/16 0700 06/25/16 0800  BP: (!) 154/65 (!) 148/68 (!) 168/86   Pulse:   95   Resp: 15 13 16    Temp:  97.5 F (36.4 C)  97.5 F (36.4 C)  TempSrc:  Axillary  Oral  SpO2:   95%   Weight:      Height:  Intake/Output Summary (Last 24 hours) at 06/25/16 1003 Last data filed at 06/25/16 0400  Gross per 24 hour  Intake           923.03 ml  Output              535 ml  Net           388.03 ml   Filed Weights   06/22/2016 0852 06/07/2016 1335  Weight: 55.8 kg (123 lb) 57.5  kg (126 lb 12.2 oz)    Examination:  General exam: Appears calm and comfortable. On supplemental oxygen. Respiratory system: Clear to auscultation.   Cardiovascular system: S1 & S2, tachycardia. Gastrointestinal system: Abdomen is nondistended, soft and nontender.  Central nervous system: Alert and oriented. Moves all limbs. Extremities: Minimal swelling RLE. Data Reviewed: I have personally reviewed following labs and imaging studies  CBC:  Recent Labs Lab 05/27/2016 0924  06/21/16 0335 06/22/16 0332 06/23/16 0329 06/24/16 0316 06/25/16 0314  WBC 8.8  < > 8.1 9.4 7.0 7.7 6.3  NEUTROABS 7.1  --   --   --   --   --   --   HGB 10.5*  < > 10.8* 10.3* 10.8* 11.1* 9.6*  HCT 33.0*  < > 34.4* 32.9* 34.3* 36.1 30.9*  MCV 87.3  < > 88.9 89.2 88.4 88.0 88.8  PLT 281  < > 252 296 296 332 265  < > = values in this interval not displayed. Basic Metabolic Panel:  Recent Labs Lab 06/22/16 0332 06/22/16 1757 06/23/16 0329 06/24/16 0316 06/25/16 0314  NA 141 141 141 140 141  K 3.4* 3.5 3.7 4.0 3.5  CL 110 109 111 109 111  CO2 22 25 23 23 23   GLUCOSE 147* 131* 132* 151* 136*  BUN 22* 21* 21* 23* 23*  CREATININE 0.67 0.53 0.49 0.76 0.54  CALCIUM 9.2 9.2 8.9 8.8* 8.5*   GFR: Estimated Creatinine Clearance: 37.1 mL/min (by C-G formula based on SCr of 0.8 mg/dL). Liver Function Tests: No results for input(s): AST, ALT, ALKPHOS, BILITOT, PROT, ALBUMIN in the last 168 hours. No results for input(s): LIPASE, AMYLASE in the last 168 hours. No results for input(s): AMMONIA in the last 168 hours. Coagulation Profile:  Recent Labs Lab 06/10/2016 0924 06/03/2016 1503  INR 1.17 1.20   Cardiac Enzymes:  Recent Labs Lab 06/08/2016 1333 05/30/2016 1855 06/20/16 0112  TROPONINI 0.06* 0.06* 0.06*   BNP (last 3 results) No results for input(s): PROBNP in the last 8760 hours. HbA1C: No results for input(s): HGBA1C in the last 72 hours. CBG: No results for input(s): GLUCAP in the last 168  hours. Lipid Profile: No results for input(s): CHOL, HDL, LDLCALC, TRIG, CHOLHDL, LDLDIRECT in the last 72 hours. Thyroid Function Tests: No results for input(s): TSH, T4TOTAL, FREET4, T3FREE, THYROIDAB in the last 72 hours. Anemia Panel: No results for input(s): VITAMINB12, FOLATE, FERRITIN, TIBC, IRON, RETICCTPCT in the last 72 hours. Urine analysis:    Component Value Date/Time   COLORURINE RED (A) 06/24/2016 1020   APPEARANCEUR TURBID (A) 06/24/2016 1020   LABSPEC 1.025 06/24/2016 1020   PHURINE 6.0 06/24/2016 1020   GLUCOSEU NEGATIVE 06/24/2016 1020   HGBUR LARGE (A) 06/24/2016 1020   BILIRUBINUR NEGATIVE 06/24/2016 1020   KETONESUR NEGATIVE 06/24/2016 1020   PROTEINUR >300 (A) 06/24/2016 1020   UROBILINOGEN 0.2 12/27/2014 1020   NITRITE NEGATIVE 06/24/2016 1020   LEUKOCYTESUR SMALL (A) 06/24/2016 1020   Sepsis Labs: @LABRCNTIP (procalcitonin:4,lacticidven:4)  ) Recent Results (from the  past 240 hour(s))  MRSA PCR Screening     Status: None   Collection Time: 06/20/2016  5:00 PM  Result Value Ref Range Status   MRSA by PCR NEGATIVE NEGATIVE Final    Comment:        The GeneXpert MRSA Assay (FDA approved for NASAL specimens only), is one component of a comprehensive MRSA colonization surveillance program. It is not intended to diagnose MRSA infection nor to guide or monitor treatment for MRSA infections.          Radiology Studies: No results found.      Scheduled Meds: . antiseptic oral rinse  7 mL Mouth Rinse BID  . diltiazem  30 mg Oral Q8H  . fluticasone furoate-vilanterol  1 puff Inhalation Daily  . metoprolol tartrate  37.5 mg Oral Q6H  . OLANZapine zydis  5 mg Oral QHS  . pantoprazole  40 mg Oral Daily  . Rivaroxaban  15 mg Oral BID WC  . [START ON 07/11/2016] rivaroxaban  20 mg Oral Q supper  . sodium chloride flush  3 mL Intravenous Q12H   Continuous Infusions: . sodium chloride 10 mL/hr at 06/24/16 1800  . diltiazem (CARDIZEM) infusion  12.5 mg/hr (06/24/16 2024)     LOS: 6 days    Time spent: 7 Minutes    Dana Allan, MD  [facility] Pager #: [phone] 7PM-7AM contact night coverage as above   Pressure ulcers (legs) - Present on admission.

## 2016-06-27 NOTE — Progress Notes (Signed)
Patient expired at 0523 confirmed with Lucina Mellow and charged nurse. Elbing Donor called.  Family and [facility] team notified.

## 2016-06-27 DEATH — deceased

## 2016-07-03 ENCOUNTER — Ambulatory Visit: Payer: Self-pay | Admitting: Internal Medicine

## 2016-07-10 ENCOUNTER — Encounter: Payer: Self-pay | Admitting: *Deleted

## 2016-07-28 NOTE — Discharge Summary (Signed)
Physician Discharge Summary  [name] [id number] DOB: 1921/12/26 DOA: 06/18/2016  PCP: [name], MD  Admit date: 06/03/2016 Discharge date: Patient is said to have expired on 27-Jun-2016  DEATH DISCHARGE SUMMARY  Discharge Diagnoses:    Extensive Bilateral pulmonary embolism (HCC)   GI bleed   Diverticulosis of colon with recent hemorrhage   Acute respiratory failure with hypoxia (HCC)   Atrial fibrillation with RVR (HCC)   Pressure ulcer   Acute deep vein thrombosis (DVT) of right lower extremity (HCC) (Extensive)  Hospital Course: 82 year old female with medical history significant of history of Atrial fibrillationb (not on anticoagulation due to recent GI Bleed), remote history of left-sided breast cancer, hypertension, GERD and recent lower GI bleed (discharged from the hospital about a month ago for GI bleed). Patient was sent from SNF to the hospital because of SOB. She recalled transient SOB over the past few days but this morning she had very severe shortness of breath so she was brought to the hospital for further evaluation she was tachycardic as well. Upon initial evaluation in the ED she was found to have A. fib with RVR, initial blood pressure was 86/69 so she was given 500 mL of normal saline, patient currently very hypoxic and she is on a nonrebreather mask satting in the low 90s. CT scan showed extensive PE bilaterally. Doppler ultrasound of the lower extremities done later revealed extensive DVT right lower extremity. Patient was admitted to ICU and was started on anticoagulation. Risk and benefits of anticoagulation were discussed with the patient's Power of [place]. Intensive care team was consulted to assist with the patient's management. The intensive Care team directed patient's anticoagulation treatment. Hematology and Oncology team was also consulted and the team advised that the patient's management was already optimized. Patient remained in atrial  fibrillation with rapid ventricular response and had to remain on IV cardizem. Patient remained critically ill. Patient was said to have expired on 06/27/2016.  For the records, I came off service on 06/25/2016.   Signed:  Dana Allan, MD  [facility] Pager #: [phone] 7PM-7AM contact night coverage as above

## 2017-01-10 ENCOUNTER — Encounter: Payer: Self-pay | Admitting: Internal Medicine

## 2017-03-17 IMAGING — CT CT ANGIO CHEST
2 of 6 series · 17 of 36 positions shown · IV contrast (ISOVUE 370)
Comparison: Chest radiograph June 19, 2016

CLINICAL DATA: Shortness of Breath. History of left-sided breast
carcinoma

EXAM:
CT ANGIOGRAPHY CHEST WITH CONTRAST
TECHNIQUE: Multidetector CT imaging of the chest was performed using the
standard protocol during bolus administration of intravenous
contrast. Multiplanar CT image reconstructions and MIPs were
obtained to evaluate the vascular anatomy.
CONTRAST:  100 mL Isovue 370 nonionic

[Series 6: thins for pacs · axial · 0.71mm/px · z∈[+1394,+1666]mm · 16 of 304 slices shown]
[im 16/304  lung]
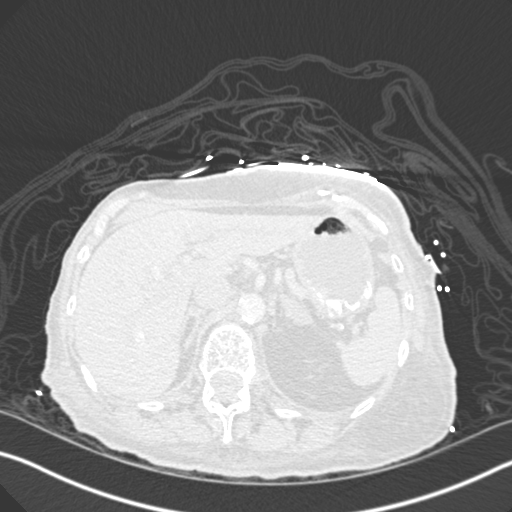
[im 31/304  mediastinal]
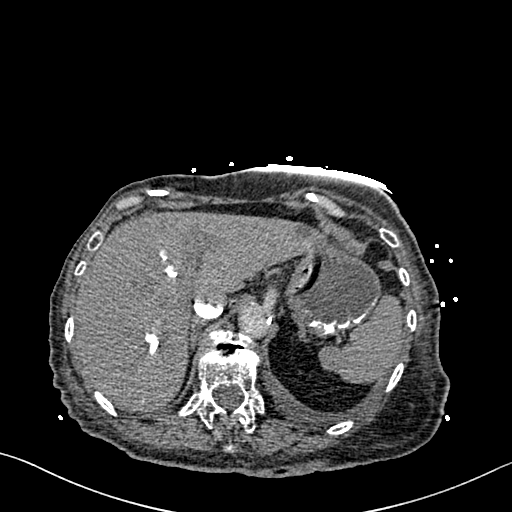
[im 46/304  lung]
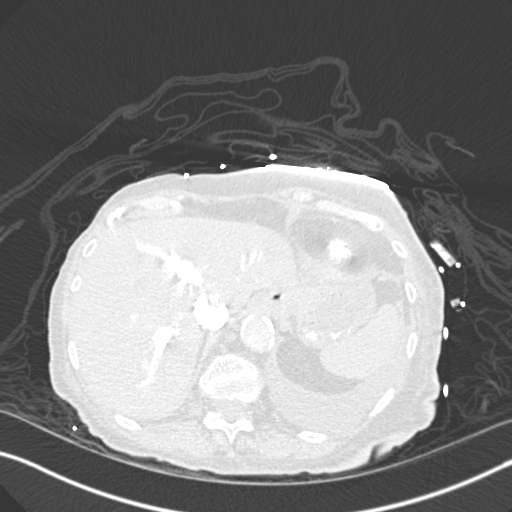
[im 76/304  mediastinal]
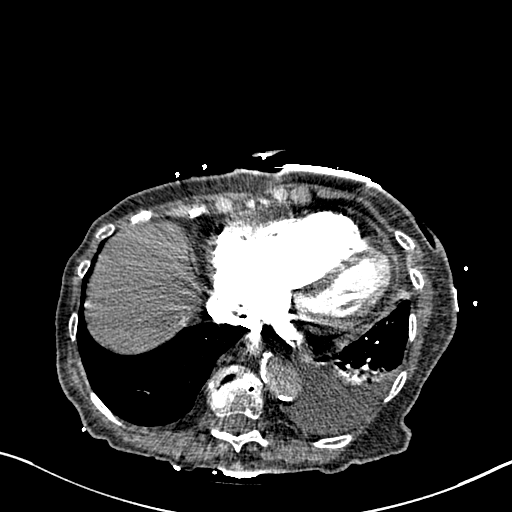
[im 91/304  lung]
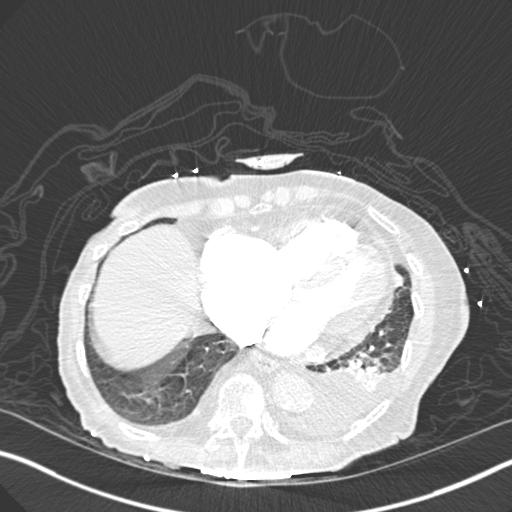
[im 107/304  mediastinal]
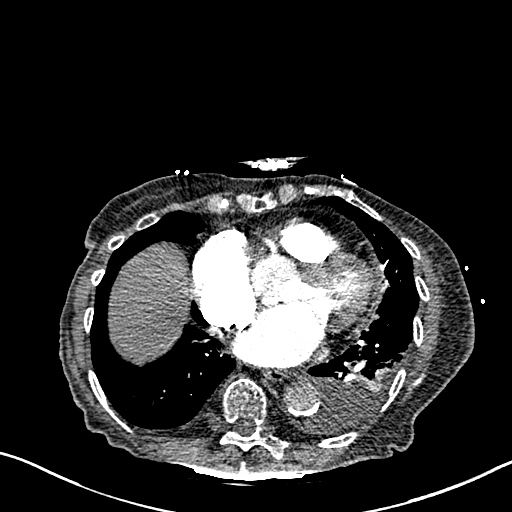
[im 122/304  lung]
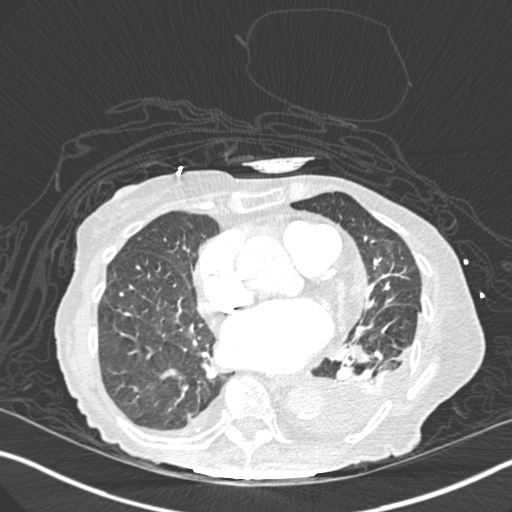
[im 137/304  mediastinal]
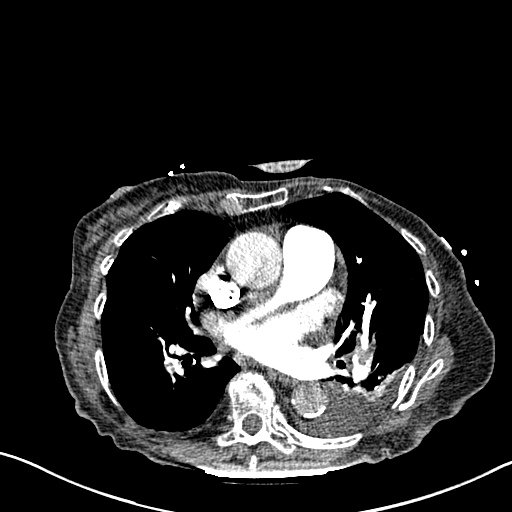
[im 167/304  lung]
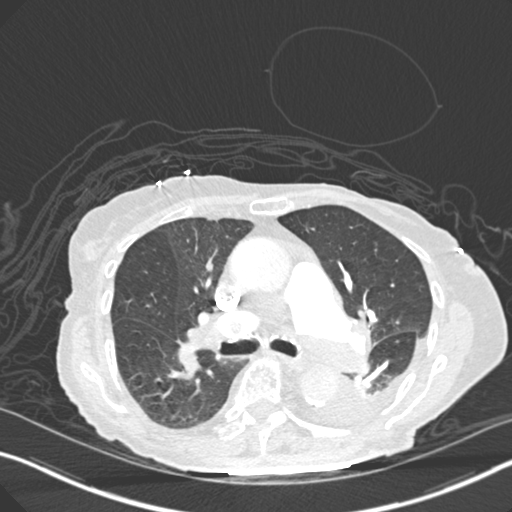
[im 182/304  mediastinal]
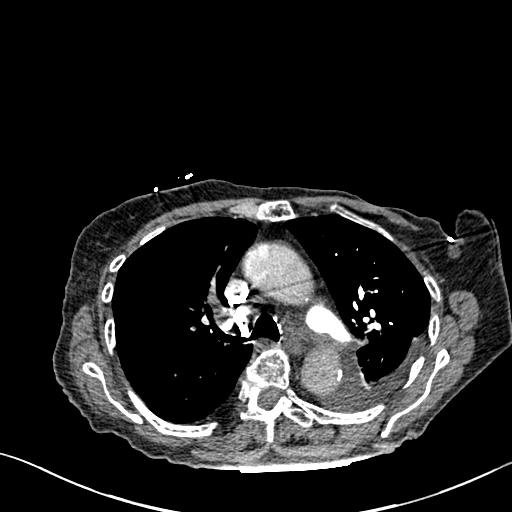
[im 197/304  lung]
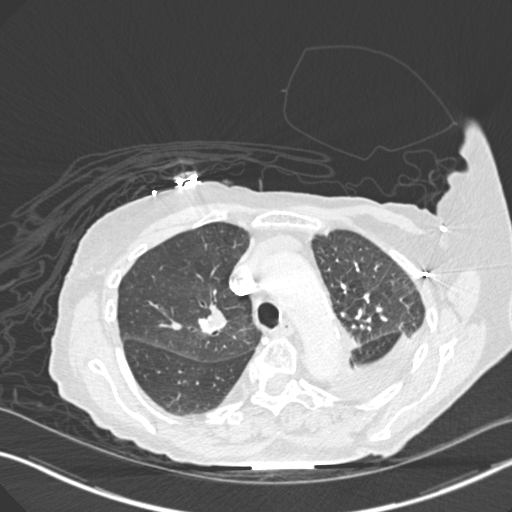
[im 213/304  mediastinal]
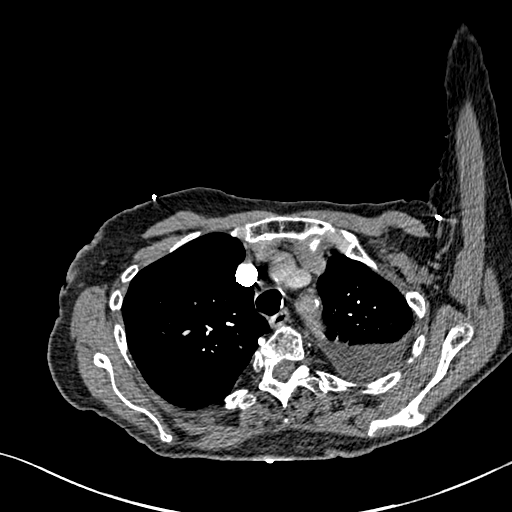
[im 228/304  lung]
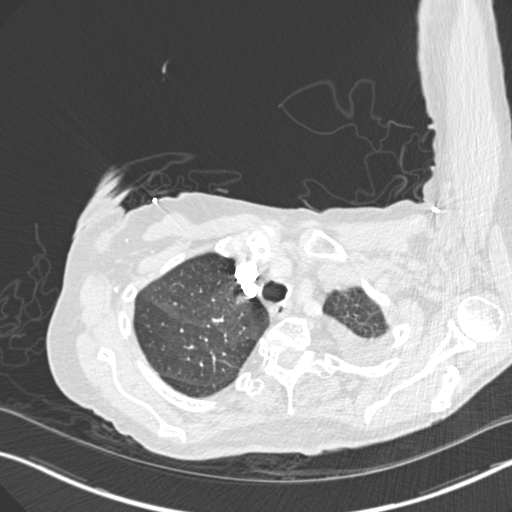
[im 258/304  mediastinal]
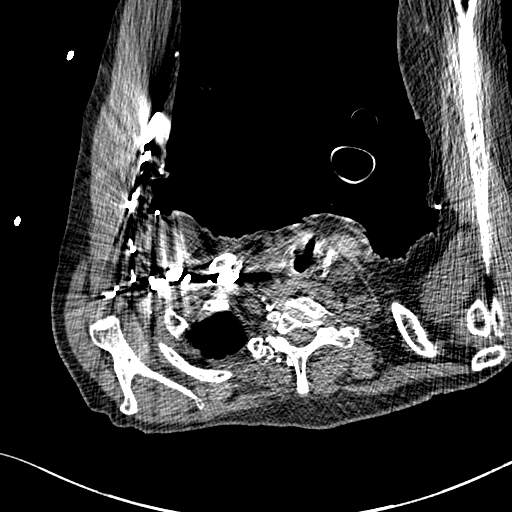
[im 273/304  lung]
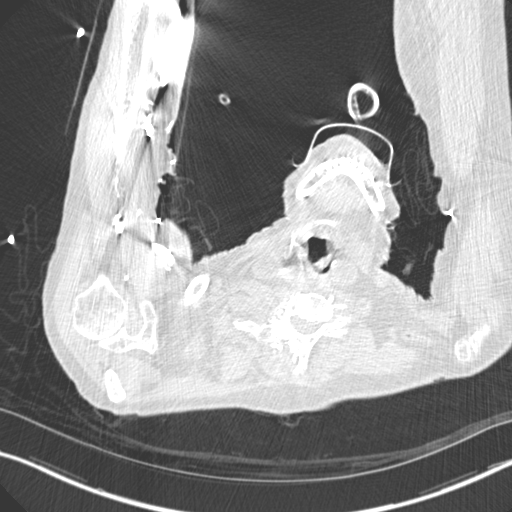
[im 288/304  mediastinal]
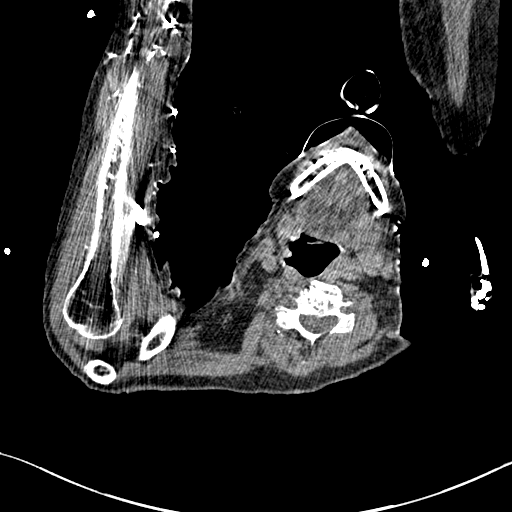

[Series 8: coronal mpr · coronal · 0.59mm/px · 1 of 125 slices shown]
[im 63/125  mediastinal]
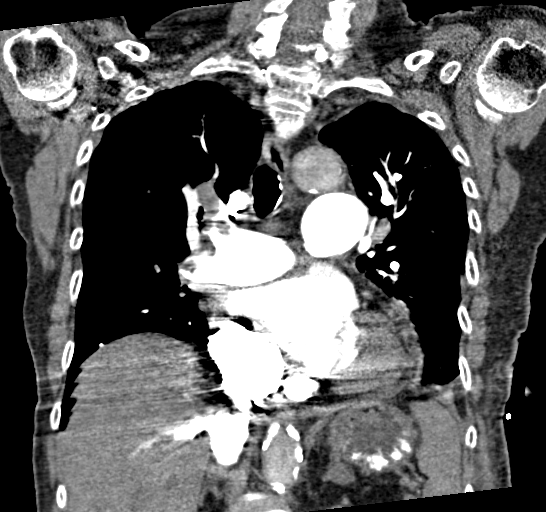

[17 of 36 positions shown; findings below may reference images not displayed]

FINDINGS: Cardiovascular: There is extensive pulmonary embolus arising from
the distal main pulmonary arteries bilaterally, extensive
bilaterally,with extension into multiple upper and lower lobe
pulmonary arterial branches, more pronounced on the right than on
the left. The right ventricular to left ventricular diameter ratio
is 1.7, consistent with right heart strain.

The ascending thoracic aorta diameter measures 4.0 x 4.0 cm. There
is no appreciable thoracic aortic dissection. There is
atherosclerotic calcification throughout the aorta. The visualized
great vessels appear normal except for mild scattered foci of
atherosclerotic calcification. The right and left common carotid
arteries arise as a common trunk, an anatomic variant. There is
coronary artery calcification in the left anterior descending
coronary artery. There is a small pericardial effusion.

Mediastinum/Nodes: Thyroid appears diminutive. There is no
appreciable thoracic adenopathy.

Lungs/Pleura: There is a moderate pleural effusion on the left with
left lower lobe consolidation. There is fatty prominence along the
right posterior pleura with mild pleural thickening posteriorly in
this area. There is no edema or consolidation on the right.

Upper Abdomen: Contrast is reflux into the inferior vena cava and
hepatic veins. There is atherosclerotic calcification in the upper
abdominal aorta. Visualized upper abdominal structures otherwise
appear normal.

Musculoskeletal: There is degenerative change in the thoracic spine.
No blastic or lytic bone lesions. Patient is status post mastectomy
on the left. There are surgical clips in left axillary region.

Review of the MIP images confirms the above findings.
IMPRESSION: Positive for acute PE with CT evidence of right heart strain (RV/LV
Ratio = 1.7) consistent with at least submassive (intermediate risk)
PE. The presence of right heart strain has been associated with an
increased risk of morbidity and mortality. Please activate Code PE
by paging [phone]. Pulmonary emboli are extensive bilaterally
with extensive involving both main pulmonary arteries as well as
multiple peripheral branches. Reflux of contrast into the inferior
vena cava and hepatic veins is consistent with elevated right heart
pressure.

Prominence of the ascending thoracic aorta with a measured diameter
4.0 x 4.0 cm. Recommend annual imaging followup by CTA or MRA. This
recommendation follows 6818
ACCF/AHA/AATS/ACR/ASA/SCA/JUMPER/OBAS/RICKIE/FOLASADE Guidelines for the
Diagnosis and Management of Patients with Thoracic Aortic Disease.
Circulation. 6818; 121: [id number].

Moderate pleural effusion on the left with left lower lobe
consolidation. Mild posterior pleural thickening on the right
without edema or consolidation on the right.

Small pericardial effusion. Multiple foci of coronary artery
calcification. There is atherosclerotic calcification in the aorta.

Status post left mastectomy.

Critical Value/emergent results were called by telephone at the time
of interpretation on 06/19/2016 at [DATE] to Dr. RUDI JUMPER ,
who verbally acknowledged these results.

## 2017-03-18 IMAGING — DX DG CHEST 1V PORT
1 series · 1 of 1 positions shown · non-contrast
Comparison: 06/19/2016 chest CT and chest radiograph.

CLINICAL DATA: [AGE] female with acute pulmonary embolus,
shortness of breath and acute respiratory failure.

EXAM:
PORTABLE CHEST 1 VIEW

[chest ap]
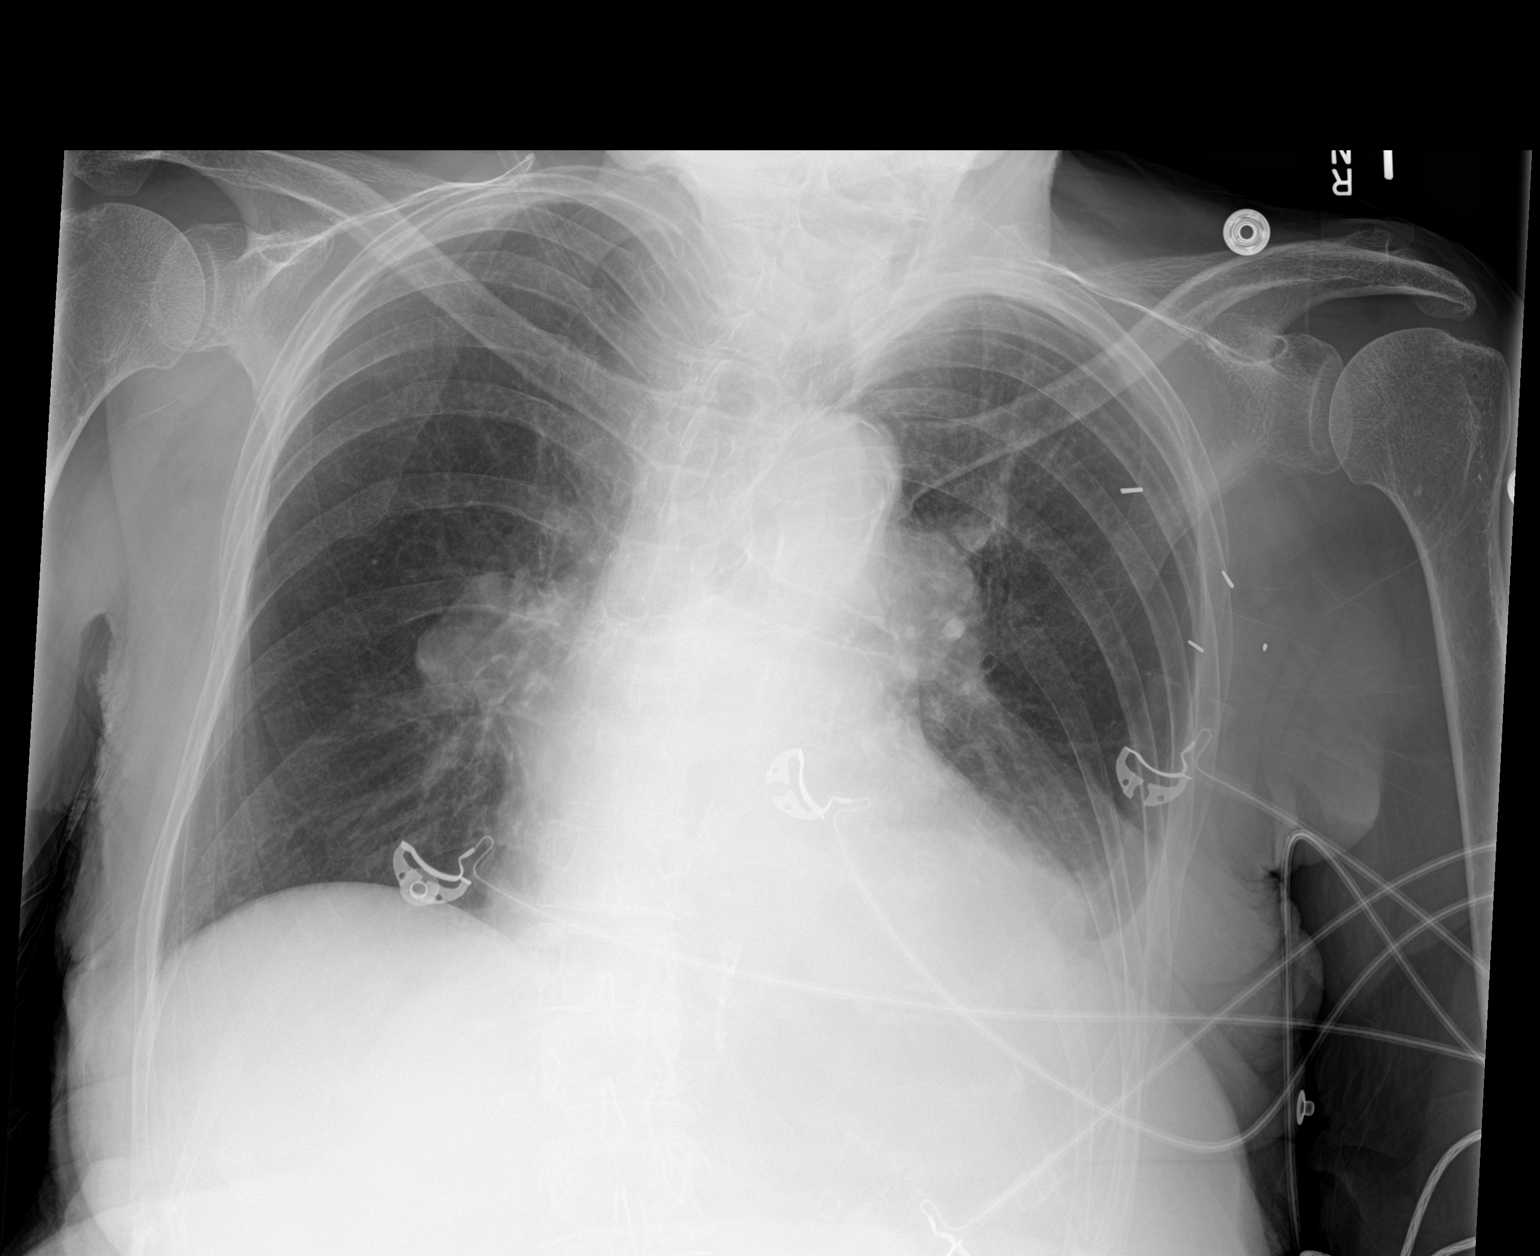

[1 of 1 positions shown; findings below may reference images not displayed]

FINDINGS: Cardiomegaly and prominent central pulmonary arteries again noted.

Left lower lobe atelectasis and left pleural effusion again noted.

There is no evidence of pneumothorax or pulmonary edema.

There has been little interval change since the prior study.

Surgical clips overlying the left chest/axilla noted.
IMPRESSION: Unchanged appearance of the chest with left lower lung atelectasis,
left pleural effusion, cardiomegaly and prominent central pulmonary
arteries.
